# Patient Record
Sex: Female | Born: 1960
Health system: Southern US, Community
[De-identification: ages and names within clinical notes are randomized; demographics above are authoritative.]

## PROBLEM LIST (undated history)

## (undated) DIAGNOSIS — C801 Malignant (primary) neoplasm, unspecified: Secondary | ICD-10-CM

## (undated) HISTORY — PX: TONSILLECTOMY: SUR1361

---

## 1999-05-16 ENCOUNTER — Emergency Department (HOSPITAL_COMMUNITY): Admission: EM | Admit: 1999-05-16 | Discharge: 1999-05-16 | Payer: Self-pay | Admitting: Emergency Medicine

## 1999-11-25 ENCOUNTER — Emergency Department (HOSPITAL_COMMUNITY): Admission: EM | Admit: 1999-11-25 | Discharge: 1999-11-25 | Payer: Self-pay | Admitting: Internal Medicine

## 2000-12-01 ENCOUNTER — Emergency Department (HOSPITAL_COMMUNITY): Admission: EM | Admit: 2000-12-01 | Discharge: 2000-12-01 | Payer: Self-pay | Admitting: Emergency Medicine

## 2001-01-01 ENCOUNTER — Other Ambulatory Visit: Admission: RE | Admit: 2001-01-01 | Discharge: 2001-01-01 | Payer: Self-pay | Admitting: *Deleted

## 2001-03-24 ENCOUNTER — Emergency Department (HOSPITAL_COMMUNITY): Admission: EM | Admit: 2001-03-24 | Discharge: 2001-03-24 | Payer: Self-pay | Admitting: Emergency Medicine

## 2002-01-24 ENCOUNTER — Emergency Department (HOSPITAL_COMMUNITY): Admission: EM | Admit: 2002-01-24 | Discharge: 2002-01-24 | Payer: Self-pay | Admitting: Emergency Medicine

## 2003-04-04 ENCOUNTER — Emergency Department (HOSPITAL_COMMUNITY): Admission: EM | Admit: 2003-04-04 | Discharge: 2003-04-04 | Payer: Self-pay | Admitting: Emergency Medicine

## 2007-01-23 ENCOUNTER — Emergency Department (HOSPITAL_COMMUNITY): Admission: EM | Admit: 2007-01-23 | Discharge: 2007-01-23 | Payer: Self-pay | Admitting: Emergency Medicine

## 2007-07-12 ENCOUNTER — Emergency Department (HOSPITAL_COMMUNITY): Admission: EM | Admit: 2007-07-12 | Discharge: 2007-07-12 | Payer: Self-pay | Admitting: Emergency Medicine

## 2009-04-19 ENCOUNTER — Emergency Department (HOSPITAL_COMMUNITY): Admission: EM | Admit: 2009-04-19 | Discharge: 2009-04-19 | Payer: Self-pay | Admitting: Emergency Medicine

## 2010-01-10 ENCOUNTER — Ambulatory Visit: Payer: Self-pay | Admitting: Physician Assistant

## 2010-01-10 DIAGNOSIS — M545 Low back pain, unspecified: Secondary | ICD-10-CM | POA: Insufficient documentation

## 2010-01-10 DIAGNOSIS — R030 Elevated blood-pressure reading, without diagnosis of hypertension: Secondary | ICD-10-CM | POA: Insufficient documentation

## 2010-01-10 DIAGNOSIS — F17200 Nicotine dependence, unspecified, uncomplicated: Secondary | ICD-10-CM | POA: Insufficient documentation
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
## 2010-09-24 NOTE — Letter (Signed)
Summary: PT INFORMATION SHEET  PT INFORMATION SHEET   Imported By: Arta Bruce 03/01/2010 15:05:11  _____________________________________________________________________  External Attachment:    Type:   Image     Comment:   External Document

## 2010-09-24 NOTE — Letter (Signed)
Summary: Handout Printed  Printed Handout:  - Diet - Sodium-Controlled 

## 2010-11-06 ENCOUNTER — Emergency Department (HOSPITAL_COMMUNITY)
Admission: EM | Admit: 2010-11-06 | Discharge: 2010-11-06 | Disposition: A | Discharge status: Home / Self Care | Payer: Self-pay | Attending: Emergency Medicine | Admitting: Emergency Medicine

## 2010-11-06 DIAGNOSIS — B9789 Other viral agents as the cause of diseases classified elsewhere: Secondary | ICD-10-CM | POA: Insufficient documentation

## 2010-11-06 DIAGNOSIS — R05 Cough: Secondary | ICD-10-CM | POA: Insufficient documentation

## 2010-11-06 DIAGNOSIS — R059 Cough, unspecified: Secondary | ICD-10-CM | POA: Insufficient documentation

## 2010-11-30 LAB — CBC
HCT: 42.2 % (ref 36.0–46.0)
Hemoglobin: 13.9 g/dL (ref 12.0–15.0)
MCHC: 32.9 g/dL (ref 30.0–36.0)
MCV: 89.6 fL (ref 78.0–100.0)
Platelets: 184 10*3/uL (ref 150–400)
RBC: 4.71 MIL/uL (ref 3.87–5.11)
RDW: 13.2 % (ref 11.5–15.5)
WBC: 18.9 10*3/uL — ABNORMAL HIGH (ref 4.0–10.5)

## 2010-11-30 LAB — COMPREHENSIVE METABOLIC PANEL
ALT: 17 U/L (ref 0–35)
AST: 34 U/L (ref 0–37)
Albumin: 3.4 g/dL — ABNORMAL LOW (ref 3.5–5.2)
Alkaline Phosphatase: 68 U/L (ref 39–117)
BUN: 9 mg/dL (ref 6–23)
CO2: 25 mEq/L (ref 19–32)
Calcium: 9 mg/dL (ref 8.4–10.5)
Chloride: 97 mEq/L (ref 96–112)
Creatinine, Ser: 1.34 mg/dL — ABNORMAL HIGH (ref 0.4–1.2)
GFR calc Af Amer: 51 mL/min — ABNORMAL LOW (ref >60)
GFR calc non Af Amer: 42 mL/min — ABNORMAL LOW (ref >60)
Glucose, Bld: 114 mg/dL — ABNORMAL HIGH (ref 70–99)
Potassium: 3.6 mEq/L (ref 3.5–5.1)
Sodium: 135 mEq/L (ref 135–145)
Total Bilirubin: 1.5 mg/dL — ABNORMAL HIGH (ref 0.3–1.2)
Total Protein: 8.3 g/dL (ref 6.0–8.3)

## 2010-11-30 LAB — RAPID STREP SCREEN (MED CTR MEBANE ONLY): Streptococcus, Group A Screen (Direct): NEGATIVE

## 2010-11-30 LAB — URINE MICROSCOPIC-ADD ON

## 2010-11-30 LAB — DIFFERENTIAL
Basophils Absolute: 0 10*3/uL (ref 0.0–0.1)
Basophils Relative: 0 % (ref 0–1)
Eosinophils Absolute: 0 10*3/uL (ref 0.0–0.7)
Eosinophils Relative: 0 % (ref 0–5)
Lymphs Abs: 1.1 10*3/uL (ref 0.7–4.0)
Neutrophils Relative %: 86 % — ABNORMAL HIGH (ref 43–77)

## 2010-11-30 LAB — URINALYSIS, ROUTINE W REFLEX MICROSCOPIC
Bilirubin Urine: NEGATIVE
Ketones, ur: 40 mg/dL — AB
Nitrite: POSITIVE — AB
Protein, ur: 100 mg/dL — AB
Urobilinogen, UA: 1 mg/dL (ref 0.0–1.0)
pH: 6 (ref 5.0–8.0)

## 2010-11-30 LAB — MONONUCLEOSIS SCREEN: Mono Screen: NEGATIVE

## 2010-11-30 LAB — POCT PREGNANCY, URINE: Preg Test, Ur: NEGATIVE

## 2013-10-24 ENCOUNTER — Encounter (HOSPITAL_COMMUNITY): Payer: Self-pay | Admitting: Emergency Medicine

## 2013-10-24 ENCOUNTER — Emergency Department (HOSPITAL_COMMUNITY)
Admission: EM | Admit: 2013-10-24 | Discharge: 2013-10-24 | Disposition: A | Discharge status: Home / Self Care | Payer: No Typology Code available for payment source | Attending: Emergency Medicine | Admitting: Emergency Medicine

## 2013-10-24 DIAGNOSIS — R599 Enlarged lymph nodes, unspecified: Secondary | ICD-10-CM | POA: Insufficient documentation

## 2013-10-24 DIAGNOSIS — R000 Tachycardia, unspecified: Secondary | ICD-10-CM | POA: Insufficient documentation

## 2013-10-24 DIAGNOSIS — F17200 Nicotine dependence, unspecified, uncomplicated: Secondary | ICD-10-CM | POA: Insufficient documentation

## 2013-10-24 DIAGNOSIS — K047 Periapical abscess without sinus: Secondary | ICD-10-CM | POA: Insufficient documentation

## 2013-10-24 DIAGNOSIS — K029 Dental caries, unspecified: Secondary | ICD-10-CM | POA: Insufficient documentation

## 2013-10-24 MED ORDER — HYDROCODONE-ACETAMINOPHEN 5-325 MG PO TABS: 1.0000 | ORAL_TABLET | Freq: Four times a day (QID) | ORAL | Status: DC | PRN

## 2013-10-24 MED ORDER — HYDROCODONE-ACETAMINOPHEN 5-325 MG PO TABS
1.0000 | ORAL_TABLET | Freq: Once | ORAL | Status: AC
  Administered 2013-10-24: 1 via ORAL
  Filled 2013-10-24: qty 1

## 2013-10-24 MED ORDER — PENICILLIN V POTASSIUM 250 MG PO TABS: 250.0000 mg | ORAL_TABLET | Freq: Four times a day (QID) | ORAL | Status: AC

## 2013-10-24 NOTE — ED Notes (Signed)
Pt states that she has dental abscess for about two weeks but doesn't have dental insurance to get scene. Pt been having pain for the past two weeks then last night she started having facial swelling to left lower side of jaw/cheek and back of throat. Pt states "hard to swallow".

## 2013-10-24 NOTE — Discharge Instructions (Signed)
Abscessed Tooth  An abscessed tooth is an infection around your tooth. It may be caused by holes or damage to the tooth (cavity) or a dental disease. An abscessed tooth causes mild to very bad pain in and around the tooth. See your dentist right away if you have tooth or gum pain.  HOME CARE   Take your medicine as told. Finish it even if you start to feel better.   Do not drive after taking pain medicine.   Rinse your mouth (gargle) often with salt water ( teaspoon salt in 8 ounces of warm water).   Do not apply heat to the outside of your face.  GET HELP RIGHT AWAY IF:    You have a temperature by mouth above 102 F (38.9 C), not controlled by medicine.   You have chills and a very bad headache.   You have problems breathing or swallowing.   Your mouth will not open.   You develop puffiness (swelling) on the neck or around the eye.   Your pain is not helped by medicine.   Your pain is getting worse instead of better.  MAKE SURE YOU:    Understand these instructions.   Will watch your condition.   Will get help right away if you are not doing well or get worse.  Document Released: 01/28/2008 Document Revised: 11/03/2011 Document Reviewed: 11/19/2010  [employer] Patient Information 2014 [employer].

## 2013-10-24 NOTE — ED Provider Notes (Addendum)
CSN: [id number]     Arrival date & time 10/24/13  3220 History   First MD Initiated Contact with Patient 10/24/13 [phone]     Chief Complaint  Patient presents with  . Facial Swelling  . dental abscess      (Consider location/radiation/quality/duration/timing/severity/associated sxs/prior Treatment) Patient is a 53 y.o. female presenting with tooth pain. The history is provided by the patient.  Dental Pain Location:  Lower Lower teeth location:  18/LL 2nd molar Quality:  Pulsating, sharp and throbbing Severity:  Severe Onset quality:  Gradual Duration:  2 weeks Timing:  Intermittent Progression:  Worsening Chronicity:  New Context: abscess   Relieved by:  Nothing Worsened by:  Cold food/drink, hot food/drink, touching and jaw movement Ineffective treatments:  NSAIDs and ice Associated symptoms: facial pain, facial swelling and gum swelling   Associated symptoms: no congestion, no difficulty swallowing, no drooling, no fever, no neck pain, no neck swelling and no trismus   Risk factors: lack of dental care, periodontal disease and smoking   Risk factors: no immunosuppression     History reviewed. No pertinent past medical history. History reviewed. No pertinent past surgical history. No family history on file. History  Substance Use Topics  . Smoking status: Current Every Day Smoker    Types: Cigarettes  . Smokeless tobacco: Never Used  . Alcohol Use: Yes     Comment: social   OB History   Grav Para Term Preterm Abortions TAB SAB Ect Mult Living                 Review of Systems  Constitutional: Negative for fever.  HENT: Positive for facial swelling. Negative for congestion and drooling.   Musculoskeletal: Negative for neck pain.  All other systems reviewed and are negative.      Allergies  Review of patient's allergies indicates no known allergies.  Home Medications   Current Outpatient Rx  Name  Route  Sig  Dispense  Refill  . naproxen sodium (ANAPROX)  220 MG tablet   Oral   Take 220 mg by mouth 2 (two) times daily as needed.          BP 144/86  Pulse 105  Temp(Src) 99.6 F (37.6 C) (Oral)  Resp 17  SpO2 100% Physical Exam  Nursing note and vitals reviewed. Constitutional: She is oriented to person, place, and time.  HENT:  Head:    Mouth/Throat: No trismus in the jaw. Dental abscesses and dental caries present. No oropharyngeal exudate, posterior oropharyngeal edema, posterior oropharyngeal erythema or tonsillar abscesses.    Eyes: EOM are normal. Pupils are equal, round, and reactive to light.  Neck: Normal range of motion. Neck supple. No tracheal deviation present.  No palpable neck tenderness.  No swelling under the tongue  Cardiovascular: Tachycardia present.   Pulmonary/Chest: Effort normal. No stridor.  Lymphadenopathy:    She has cervical adenopathy.  Neurological: She is alert and oriented to person, place, and time.  Skin: Skin is warm and dry.  Psychiatric: She has a normal mood and affect. Her behavior is normal.    ED Course  Dental Date/Time: 10/24/2013 9:07 AM Performed by: Blanchie Dessert Authorized by: Blanchie Dessert Consent: Verbal consent obtained. Risks and benefits: risks, benefits and alternatives were discussed Consent given by: patient Local anesthesia used: yes Anesthesia: local infiltration Local anesthetic: bupivacaine 0.5% with epinephrine Anesthetic total: 2 ml Patient sedated: no Patient tolerance: Patient tolerated the procedure well with no immediate complications. Comments: Apical dental block preformed  for abscess drainage.  Only minimal pus expressed   (including critical care time) Labs Review Labs Reviewed - No data to display Imaging Review No results found.   EKG Interpretation None      MDM   Final diagnoses:  Dental abscess    Pt with dental caries and facial swelling.  No signs of ludwig's angina or difficulty swallowing and no systemic symptoms.  Pt  initially stated it was hard to swallow but no difficulty swallowing secretions and she states the swelling makes it hard to drink. Will treat with PCN and have pt f/u with dentist.     Blanchie Dessert, MD 10/24/13 [place], MD 10/24/13 [phone]

## 2013-11-03 ENCOUNTER — Encounter (HOSPITAL_COMMUNITY): Payer: Self-pay | Admitting: Emergency Medicine

## 2013-11-03 ENCOUNTER — Emergency Department (HOSPITAL_COMMUNITY)
Admission: EM | Admit: 2013-11-03 | Discharge: 2013-11-03 | Disposition: A | Discharge status: Home / Self Care | Payer: No Typology Code available for payment source | Attending: Emergency Medicine | Admitting: Emergency Medicine

## 2013-11-03 DIAGNOSIS — F17200 Nicotine dependence, unspecified, uncomplicated: Secondary | ICD-10-CM | POA: Insufficient documentation

## 2013-11-03 DIAGNOSIS — K029 Dental caries, unspecified: Secondary | ICD-10-CM | POA: Insufficient documentation

## 2013-11-03 DIAGNOSIS — K002 Abnormalities of size and form of teeth: Secondary | ICD-10-CM | POA: Insufficient documentation

## 2013-11-03 DIAGNOSIS — K047 Periapical abscess without sinus: Secondary | ICD-10-CM | POA: Insufficient documentation

## 2013-11-03 MED ORDER — CLINDAMYCIN HCL 300 MG PO CAPS
300.0000 mg | ORAL_CAPSULE | Freq: Once | ORAL | Status: AC
  Administered 2013-11-03: 300 mg via ORAL
  Filled 2013-11-03: qty 1

## 2013-11-03 MED ORDER — OXYCODONE-ACETAMINOPHEN 5-325 MG PO TABS
1.0000 | ORAL_TABLET | ORAL | Status: DC | PRN
Start: 2013-11-03 — End: 2013-12-09

## 2013-11-03 MED ORDER — OXYCODONE-ACETAMINOPHEN 5-325 MG PO TABS
1.0000 | ORAL_TABLET | Freq: Once | ORAL | Status: AC
  Administered 2013-11-03: 1 via ORAL
  Filled 2013-11-03: qty 1

## 2013-11-03 MED ORDER — CLINDAMYCIN HCL 150 MG PO CAPS: 300.0000 mg | ORAL_CAPSULE | Freq: Three times a day (TID) | ORAL | Status: DC

## 2013-11-03 NOTE — ED Notes (Signed)
Pt c/o lower, L side dental abscess and L side facial swelling.  Pain score 9/10.  Pt reports she was recently seen at [facility] for same.  Sts she had the abscess drained and completed prescribed antibiotic.  Sts she is waiting for new dental coverage to start, before she can see a dentist.  Swelling noted.

## 2013-11-03 NOTE — ED Provider Notes (Signed)
CSN: [id number]     Arrival date & time 11/03/13  1213 History  This chart was scribed for non-physician practitioner working with Blanchard Kelch, MD by Stacy Gardner, ED scribe. This patient was seen in room WTR6/WTR6 and the patient's care was started at 1:10 PM.  First MD Initiated Contact with Patient 11/03/13 1226     Chief Complaint  Patient presents with  . Dental Pain     (Consider location/radiation/quality/duration/timing/severity/associated sxs/prior Treatment) Patient is a 53 y.o. female presenting with tooth pain. The history is provided by the patient and medical records. No language interpreter was used.  Dental Pain  HPI Comments: Linda Spears is a 53 y.o. female who presents to the Emergency Department complaining of lower left side dental pain from an abscess, onset of three weeks ago. Pt was seen at the ED one week ago for the abscess and had it drained. She received 40 Penicillin tablets and 15 hydrocodones which she finished the prescriptions for both. After taking the medications she mentions the swelling that extended throughout her face decreased significantly before it returned yesterday. Pt mentions that she has been taking Ibuprofen and Aleve to relieve the pain. Yesterday she reports taking Advil while at work when the left sided facial swelling presented. The swelling extends from her lower jaw to her throat. She reports having associated numbness. She states she is unable to yawn and the site feels tight. Denies trouble breathing. The infected tooth has a pus discharge.  Pt has tried cold compresses and denies applying any warm compressions.  Pt recently recieved dental insurance and she received a list of dentists yesterday   History reviewed. No pertinent past medical history. History reviewed. No pertinent past surgical history. History reviewed. No pertinent family history. History  Substance Use Topics  . Smoking status: Current Every Day Smoker     Types: Cigarettes  . Smokeless tobacco: Never Used  . Alcohol Use: Yes     Comment: social   OB History   Grav Para Term Preterm Abortions TAB SAB Ect Mult Living                 Review of Systems  HENT: Positive for dental problem.   All other systems reviewed and are negative.      Allergies  Review of patient's allergies indicates no known allergies.  Home Medications   Current Outpatient Rx  Name  Route  Sig  Dispense  Refill  . ibuprofen (ADVIL,MOTRIN) 200 MG tablet   Oral   Take 200-400 mg by mouth every 6 (six) hours as needed for moderate pain.         . naproxen sodium (ANAPROX) 220 MG tablet   Oral   Take 220 mg by mouth 2 (two) times daily as needed (pain).           BP 142/84  Pulse 56  Resp 16  SpO2 100% Physical Exam  Nursing note and vitals reviewed. Constitutional: She is oriented to person, place, and time. She appears well-developed and well-nourished. No distress.  HENT:  Head: Normocephalic and atraumatic.  Mouth/Throat: Oropharynx is clear and moist. Abnormal dentition. Dental abscesses and dental caries present.  Left Facial swelling noted. mandibular and submandibular tenderness to palpation Poor detention with multiple dental carries Gum edema surrounding 1st/2nd left lower bicuspids purulent drainage noted from the gums No swelling under the tongue. No trismus   Eyes: EOM are normal. Pupils are equal, round, and reactive to light.  Neck: Normal range of motion. Neck supple. No tracheal deviation present.  Cardiovascular: Normal rate.   Pulmonary/Chest: Effort normal. No respiratory distress.  No difficulty breathing  Abdominal: Soft. She exhibits no distension.  Musculoskeletal: Normal range of motion.  Neurological: She is alert and oriented to person, place, and time.  Skin: Skin is warm and dry.  Psychiatric: She has a normal mood and affect. Her behavior is normal.    ED Course  Procedures (including critical care  time) DIAGNOSTIC STUDIES: Oxygen Saturation is 100% on room air, normal by my interpretation.    COORDINATION OF CARE:  1:16 PM Discussed course of care with pt which includes Cleocin 300 mg and Percocet/Roxicet 5-325 mg. Pt understands and agrees.   Labs Review Labs Reviewed - No data to display Imaging Review No results found.   EKG Interpretation None      MDM   Final diagnoses:  Dental abscess   Patient with left-sided dental abscess. She was here a few days ago and had the same abscess drained a large purulent drainage. She states today is not as bad as it was last time. It is spontaneously draining.  I offered her draining abscess again however she declined. She states she just needs antibiotics until she is able to get in but her dentist. She does have an appointment scheduled for next week. She's afebrile emergency department, nontoxic appearing, no airway compromise at this time. I will start her on clindamycin, pain medications provided. Have instructed her to return if swelling is worsening or if she has any trouble breathing. Patient voiced understanding.  Filed Vitals:   11/03/13 1224  BP: 142/84  Pulse: 56  TempSrc: Oral  Resp: 16  SpO2: 100%    I personally performed the services described in this documentation, which was scribed in my presence. The recorded information has been reviewed and is accurate.    Renold Genta, PA-C 11/03/13 2033

## 2013-11-03 NOTE — ED Provider Notes (Signed)
Medical screening examination/treatment/procedure(s) were performed by non-physician practitioner and as supervising physician I was immediately available for consultation/collaboration.   EKG Interpretation None      Rolland Porter, MD, Abram Sander   Janice Norrie, MD 11/03/13 [phone]

## 2013-11-03 NOTE — Discharge Instructions (Signed)
Clindamycin for infection until all gone. Percocet for severe pain if no relief with ibuprofen. Follow up with an oral surgeon as soon as able. Return if swelling is worsening.    Dental Abscess A dental abscess is a collection of infected fluid (pus) from a bacterial infection in the inner part of the tooth (pulp). It usually occurs at the end of the tooth's root.  CAUSES   Severe tooth decay.  Trauma to the tooth that allows bacteria to enter into the pulp, such as a broken or chipped tooth. SYMPTOMS   Severe pain in and around the infected tooth.  Swelling and redness around the abscessed tooth or in the mouth or face.  Tenderness.  Pus drainage.  Bad breath.  Bitter taste in the mouth.  Difficulty swallowing.  Difficulty opening the mouth.  Nausea.  Vomiting.  Chills.  Swollen neck glands. DIAGNOSIS   A medical and dental history will be taken.  An examination will be performed by tapping on the abscessed tooth.  X-rays may be taken of the tooth to identify the abscess. TREATMENT The goal of treatment is to eliminate the infection. You may be prescribed antibiotic medicine to stop the infection from spreading. A root canal may be performed to save the tooth. If the tooth cannot be saved, it may be pulled (extracted) and the abscess may be drained.  HOME CARE INSTRUCTIONS  Only take over-the-counter or prescription medicines for pain, fever, or discomfort as directed by your caregiver.  Rinse your mouth (gargle) often with salt water ( tsp salt in 8 oz [250 ml] of warm water) to relieve pain or swelling.  Do not drive after taking pain medicine (narcotics).  Do not apply heat to the outside of your face.  Return to your dentist for further treatment as directed. SEEK MEDICAL CARE IF:  Your pain is not helped by medicine.  Your pain is getting worse instead of better. SEEK IMMEDIATE MEDICAL CARE IF:  You have a fever or persistent symptoms for more  than 2 3 days.  You have a fever and your symptoms suddenly get worse.  You have chills or a very bad headache.  You have problems breathing or swallowing.  You have trouble opening your mouth.  You have swelling in the neck or around the eye. Document Released: 08/11/2005 Document Revised: 05/05/2012 Document Reviewed: 11/19/2010 [facility] Patient Information 2014 [place], [place].

## 2013-12-09 ENCOUNTER — Encounter (HOSPITAL_COMMUNITY): Payer: Self-pay | Admitting: Emergency Medicine

## 2013-12-09 ENCOUNTER — Emergency Department (HOSPITAL_COMMUNITY)
Admission: EM | Admit: 2013-12-09 | Discharge: 2013-12-09 | Disposition: A | Discharge status: Home / Self Care | Payer: No Typology Code available for payment source | Attending: Emergency Medicine | Admitting: Emergency Medicine

## 2013-12-09 DIAGNOSIS — R21 Rash and other nonspecific skin eruption: Secondary | ICD-10-CM | POA: Insufficient documentation

## 2013-12-09 DIAGNOSIS — F17200 Nicotine dependence, unspecified, uncomplicated: Secondary | ICD-10-CM | POA: Insufficient documentation

## 2013-12-09 MED ORDER — SULFAMETHOXAZOLE-TRIMETHOPRIM 800-160 MG PO TABS: 1.0000 | ORAL_TABLET | Freq: Two times a day (BID) | ORAL | Status: DC

## 2013-12-09 MED ORDER — PERMETHRIN 5 % EX CREA: TOPICAL_CREAM | CUTANEOUS | Status: DC

## 2013-12-09 MED ORDER — HYDROXYZINE HCL 25 MG PO TABS
25.0000 mg | ORAL_TABLET | Freq: Four times a day (QID) | ORAL | Status: DC
Start: 2013-12-09 — End: 2014-01-26

## 2013-12-09 NOTE — ED Notes (Signed)
Pt c/o skin itching x 1 week, states she itches until sores form. Rash starts as small brown/red dots on ankles and wrists, fill will pus, then burst and form small dry sores.

## 2013-12-09 NOTE — Discharge Instructions (Signed)
Take Bactrim as directed until gone. Take atarax as needed for itching. Use Permethrin cream if the other interventions provide no relief. Refer to attached documents for more information.

## 2013-12-09 NOTE — ED Provider Notes (Signed)
CSN: [id number]     Arrival date & time 12/09/13  1112 History  This chart was scribed for non-physician practitioner, Alvina Chou, PA-C, working with Orlie Dakin, MD by Ladene Artist, ED Scribe. This patient was seen in room WTR7/WTR7 and the patient's care was started at 12:21 PM.   Chief Complaint  Patient presents with  . Rash   Patient is a 53 y.o. female presenting with rash. The history is provided by the patient. No language interpreter was used.  Rash Location:  Foot and hand Hand rash location:  L hand Foot rash location:  L ankle and R ankle Quality: burning and itchiness   Severity:  Moderate Onset quality:  Gradual Duration:  1 week Timing:  Constant Progression:  Spreading Chronicity:  New Relieved by:  Nothing Worsened by:  Nothing tried Ineffective treatments:  Anti-itch cream  HPI Comments: Linda Spears is a 53 y.o. female who presents to the Emergency Department complaining of rash on her L hand and both ankles onset 1 week ago. She reports associated itching and burning sensations in areas of the rash. Pt states that the rash looked like bug bites at first, but she later noticed pus before they burst. She has tried cortisone cream with no relief.   History reviewed. No pertinent past medical history. History reviewed. No pertinent past surgical history. History reviewed. No pertinent family history. History  Substance Use Topics  . Smoking status: Current Every Day Smoker    Types: Cigarettes  . Smokeless tobacco: Never Used  . Alcohol Use: Yes     Comment: social   OB History   Grav Para Term Preterm Abortions TAB SAB Ect Mult Living                 Review of Systems  Skin: Positive for rash.  All other systems reviewed and are negative.   Allergies  Review of patient's allergies indicates no known allergies.  Home Medications   Prior to Admission medications   Medication Sig Start Date End Date Taking? Authorizing Provider   ibuprofen (ADVIL,MOTRIN) 200 MG tablet Take 200-400 mg by mouth every 6 (six) hours as needed for moderate pain.   Yes Historical Provider, MD  Multiple Vitamins-Minerals (MULTIVITAMIN WITH MINERALS) tablet Take 1 tablet by mouth as needed.   Yes Historical Provider, MD   Triage Vitals: BP 145/79  Pulse 66  Temp(Src) 98.7 F (37.1 C) (Oral)  Resp 14  SpO2 100% Physical Exam  Nursing note and vitals reviewed. Constitutional: She is oriented to person, place, and time. She appears well-developed and well-nourished. No distress.  HENT:  Head: Normocephalic and atraumatic.  Eyes: EOM are normal.  Neck: Neck supple.  Cardiovascular: Normal rate.   Pulmonary/Chest: Effort normal. No respiratory distress.  Musculoskeletal: Normal range of motion.  Neurological: She is alert and oriented to person, place, and time.  Skin: Skin is warm and dry.  Papule lesions on L wrist and bilateral ankles with overlying excoriations 2 urticarial lesions on dorsal forearm that are erythematous and warm   Psychiatric: She has a normal mood and affect. Her behavior is normal.    ED Course  Procedures (including critical care time) DIAGNOSTIC STUDIES: Oxygen Saturation is100% on RA, normal by my interpretation.    COORDINATION OF CARE: 12:26 PM-Discussed treatment plan which includes anti-itch cream with pt at bedside and pt agreed to plan.   DIAGNOSTIC STUDIES: Oxygen Saturation is 100% on RA, normal by my interpretation.    COORDINATION  OF CARE:  Labs Review Labs Reviewed - No data to display  Imaging Review No results found.   EKG Interpretation None      MDM   Final diagnoses:  Rash    Patient will have bactrim and atarax for her rash. Patient advised to use Permethrin cream is symptoms do not improve in a few days. Vitals stable and patient afebrile.   I personally performed the services described in this documentation, which was scribed in my presence. The recorded information  has been reviewed and is accurate.    Alvina Chou, [place] 12/09/13 1831

## 2013-12-09 NOTE — Progress Notes (Signed)
  CARE MANAGEMENT ED NOTE 12/09/2013  Patient:  Linda Spears, Linda Spears   Account Number:  [id number]  Date Initiated:  12/09/2013  Documentation initiated by:  Jackelyn Poling  Subjective/Objective Assessment:   53 yr old 66 covered [place] pt c/o skin itching x 1 week, states she itches until sores form. Rash starts as small brown/red dots on ankles and wrists, fill will pus, then burst and form small dry sores.     Subjective/Objective Assessment Detail:   Pt voiced appreciation of resources provided     Action/Plan:   CM spoke with pt and provided her with [facility] list [facility] with in zip code [phone]   Action/Plan Detail:   Anticipated DC Date:  12/09/2013     Status Recommendation to Physician:   Result of Recommendation:    Other ED Services  Consult [facility]  Other  Outpatient Services - Pt will follow up  PCP issues    Choice offered to / List presented to:            Status of service:  Completed, signed off  ED Comments:   ED Comments Detail:

## 2013-12-10 NOTE — ED Provider Notes (Signed)
Medical screening examination/treatment/procedure(s) were performed by non-physician practitioner and as supervising physician I was immediately available for consultation/collaboration.   EKG Interpretation None       Orlie Dakin, MD 12/10/13 [phone]

## 2014-01-26 ENCOUNTER — Emergency Department (HOSPITAL_COMMUNITY)
Admission: EM | Admit: 2014-01-26 | Discharge: 2014-01-26 | Disposition: A | Discharge status: Home / Self Care | Payer: No Typology Code available for payment source | Attending: Emergency Medicine | Admitting: Emergency Medicine

## 2014-01-26 ENCOUNTER — Encounter (HOSPITAL_COMMUNITY): Payer: Self-pay | Admitting: Emergency Medicine

## 2014-01-26 DIAGNOSIS — L0291 Cutaneous abscess, unspecified: Secondary | ICD-10-CM

## 2014-01-26 DIAGNOSIS — L03211 Cellulitis of face: Principal | ICD-10-CM

## 2014-01-26 DIAGNOSIS — Z79899 Other long term (current) drug therapy: Secondary | ICD-10-CM | POA: Insufficient documentation

## 2014-01-26 DIAGNOSIS — L0201 Cutaneous abscess of face: Secondary | ICD-10-CM | POA: Insufficient documentation

## 2014-01-26 DIAGNOSIS — K089 Disorder of teeth and supporting structures, unspecified: Secondary | ICD-10-CM | POA: Insufficient documentation

## 2014-01-26 DIAGNOSIS — F17200 Nicotine dependence, unspecified, uncomplicated: Secondary | ICD-10-CM | POA: Insufficient documentation

## 2014-01-26 MED ORDER — CLINDAMYCIN HCL 300 MG PO CAPS
300.0000 mg | ORAL_CAPSULE | Freq: Once | ORAL | Status: AC
  Administered 2014-01-26: 300 mg via ORAL
  Filled 2014-01-26: qty 1

## 2014-01-26 MED ORDER — TRAMADOL HCL 50 MG PO TABS: 50.0000 mg | ORAL_TABLET | Freq: Four times a day (QID) | ORAL | Status: DC | PRN

## 2014-01-26 MED ORDER — CLINDAMYCIN HCL 300 MG PO CAPS: 300.0000 mg | ORAL_CAPSULE | Freq: Four times a day (QID) | ORAL | Status: DC

## 2014-01-26 NOTE — Discharge Instructions (Signed)

## 2014-01-26 NOTE — ED Provider Notes (Signed)
Medical screening examination/treatment/procedure(s) were conducted as a shared visit with non-physician practitioner(s) and myself.  I personally evaluated the patient during the encounter.   EKG Interpretation None      Patient here with L submandibular abscess. No tooth pain. No fever, no airway impingement. No elevation of floor of mouth. I&D performed with good results. Sent home on Clindamycin. Stable for discharge.  Osvaldo Shipper, MD 01/26/14 [phone]

## 2014-01-26 NOTE — ED Notes (Signed)
Pt has abscess to left chin.  Pt does shave around this area.  Spot came up tuesday

## 2014-01-26 NOTE — ED Provider Notes (Signed)
CSN: [id number]     Arrival date & time 01/26/14  0957 History   First MD Initiated Contact with Patient 01/26/14 1017     Chief Complaint  Patient presents with  . Abscess     (Consider location/radiation/quality/duration/timing/severity/associated sxs/prior Treatment) HPI Comments: Patient is a 53 year old female who presents today with a worsening abscess to left chin. She states that she was shaving her chin on Sunday and wonders if this could have caused the abscess develop. She does not have history of prior abscesses. She also notes that she had a dental pain approximately one month ago. The pain was in the same area as the abscess. She made a dentist appointment, but cannot be seen until later this month. She has felt chills, but denies any fevers. No shortness of breath, difficulty opening her mouth, difficulty swallowing, nausea, vomiting, abdominal pain.  Patient is a 53 y.o. female presenting with abscess. The history is provided by the patient. No language interpreter was used.  Abscess Associated symptoms: no fever, no nausea and no vomiting     History reviewed. No pertinent past medical history. History reviewed. No pertinent past surgical history. History reviewed. No pertinent family history. History  Substance Use Topics  . Smoking status: Current Every Day Smoker    Types: Cigarettes  . Smokeless tobacco: Never Used  . Alcohol Use: Yes     Comment: social   OB History   Grav Para Term Preterm Abortions TAB SAB Ect Mult Living                 Review of Systems  Constitutional: Positive for chills. Negative for fever.  HENT: Positive for dental problem and facial swelling. Negative for trouble swallowing.   Respiratory: Negative for shortness of breath.   Gastrointestinal: Negative for nausea and vomiting.  All other systems reviewed and are negative.     Allergies  Review of patient's allergies indicates no known allergies.  Home Medications   Prior  to Admission medications   Medication Sig Start Date End Date Taking? Authorizing Provider  Multiple Vitamins-Minerals (MULTIVITAMIN WITH MINERALS) tablet Take 1 tablet by mouth as needed.   Yes Historical Provider, MD  naproxen sodium (ANAPROX) 220 MG tablet Take 440 mg by mouth 2 (two) times daily with a meal.   Yes Historical Provider, MD   BP 123/77  Pulse 65  Temp(Src) 99.9 F (37.7 C) (Oral)  Resp 18  SpO2 100% Physical Exam  Nursing note and vitals reviewed. Constitutional: She is oriented to person, place, and time. She appears well-developed and well-nourished. No distress.  HENT:  Head: Normocephalic and atraumatic.  Right Ear: External ear normal.  Left Ear: External ear normal.  Nose: Nose normal.  Mouth/Throat: Uvula is midline and oropharynx is clear and moist. No trismus in the jaw.  No trismus, submental edema, or tongue elevation. Currently no tenderness to palpation in left gum or teeth. Patient with 5 cm area of induration to left chin. There is a central 2 cm area of fluctuance. No drainage.   Eyes: Conjunctivae are normal.  Neck: Normal range of motion.  Cardiovascular: Normal rate, regular rhythm and normal heart sounds.   Pulmonary/Chest: Effort normal and breath sounds normal. No stridor. No respiratory distress. She has no wheezes. She has no rales.  Abdominal: Soft. She exhibits no distension.  Musculoskeletal: Normal range of motion.  Neurological: She is alert and oriented to person, place, and time. She has normal strength.  Skin: Skin is warm  and dry. She is not diaphoretic. No erythema.  Psychiatric: She has a normal mood and affect. Her behavior is normal.    ED Course  Procedures (including critical care time) Labs Review Labs Reviewed - No data to display  Imaging Review No results found.   EKG Interpretation None      INCISION AND DRAINAGE Performed by: Elwyn Lade Consent: Verbal consent obtained. Risks and benefits: risks,  benefits and alternatives were discussed Type: abscess  Body area: left chin Anesthesia: local infiltration  Incision was made with a scalpel.  Local anesthetic: lidocaine 2%   Anesthetic total: 3 ml  Complexity: complex Blunt dissection to break up loculations  Drainage: purulent  Drainage amount: copious  Patient tolerance: Patient tolerated the procedure well with no immediate complications.     MDM   Final diagnoses:  Abscess    Patient with skin abscess amenable to incision and drainage.  Abscess was not large enough to warrant packing or drain,  wound recheck in 2 days. Patient without trismus, difficulty swallowing, or difficulty breathing. Encouraged home warm soaks and flushing.  Mild signs of cellulitis is surrounding skin.  Will d/c to home with clindamycin. Strict return instructions given. Patient has dental follow up. Dr. Mingo Amber evaluated patient and agrees with plan. Patient / Family / Caregiver informed of clinical course, understand medical decision-making process, and agree with plan.     Elwyn Lade, PA-C 01/26/14 1153

## 2014-01-26 NOTE — Progress Notes (Signed)
[facility] did not get to see patient but will be sending information about [facility] Orange Card program, using the address provided.  °

## 2014-01-27 MED ORDER — SULFAMETHOXAZOLE-TRIMETHOPRIM 800-160 MG PO TABS: 1.0000 | ORAL_TABLET | Freq: Two times a day (BID) | ORAL | Status: DC

## 2014-01-27 MED ORDER — CEPHALEXIN 500 MG PO CAPS: 500.0000 mg | ORAL_CAPSULE | Freq: Four times a day (QID) | ORAL | Status: DC

## 2014-01-27 NOTE — ED Provider Notes (Signed)
Medical screening examination/treatment/procedure(s) were performed by non-physician practitioner and as supervising physician I was immediately available for consultation/collaboration.   EKG Interpretation None        Ephraim Hamburger, MD 01/27/14 [phone]

## 2014-01-27 NOTE — ED Provider Notes (Signed)
Patient returned to the ED because she was unable to afford her Clindamycin.  I reviewed her chart from yesterday and it was reported that her cellulitis was mild. I will change to Keflex and Bactrim. NKDA,  Linus Mako, PA-C 01/27/14 1049

## 2014-04-02 ENCOUNTER — Emergency Department (HOSPITAL_COMMUNITY)
Admission: EM | Admit: 2014-04-02 | Discharge: 2014-04-02 | Disposition: A | Discharge status: Home / Self Care | Payer: No Typology Code available for payment source | Attending: Emergency Medicine | Admitting: Emergency Medicine

## 2014-04-02 ENCOUNTER — Encounter (HOSPITAL_COMMUNITY): Payer: Self-pay | Admitting: Emergency Medicine

## 2014-04-02 ENCOUNTER — Emergency Department (HOSPITAL_COMMUNITY): Payer: No Typology Code available for payment source

## 2014-04-02 DIAGNOSIS — R51 Headache: Secondary | ICD-10-CM | POA: Insufficient documentation

## 2014-04-02 DIAGNOSIS — Z791 Long term (current) use of non-steroidal anti-inflammatories (NSAID): Secondary | ICD-10-CM | POA: Insufficient documentation

## 2014-04-02 DIAGNOSIS — F1310 Sedative, hypnotic or anxiolytic abuse, uncomplicated: Secondary | ICD-10-CM | POA: Insufficient documentation

## 2014-04-02 DIAGNOSIS — R42 Dizziness and giddiness: Secondary | ICD-10-CM | POA: Insufficient documentation

## 2014-04-02 DIAGNOSIS — F17200 Nicotine dependence, unspecified, uncomplicated: Secondary | ICD-10-CM | POA: Insufficient documentation

## 2014-04-02 DIAGNOSIS — Z79899 Other long term (current) drug therapy: Secondary | ICD-10-CM | POA: Insufficient documentation

## 2014-04-02 DIAGNOSIS — R9431 Abnormal electrocardiogram [ECG] [EKG]: Secondary | ICD-10-CM | POA: Insufficient documentation

## 2014-04-02 DIAGNOSIS — R079 Chest pain, unspecified: Secondary | ICD-10-CM | POA: Insufficient documentation

## 2014-04-02 LAB — CBC WITH DIFFERENTIAL/PLATELET
BASOS ABS: 0 10*3/uL (ref 0.0–0.1)
Basophils Relative: 1 % (ref 0–1)
EOS PCT: 5 % (ref 0–5)
Eosinophils Absolute: 0.3 10*3/uL (ref 0.0–0.7)
HEMATOCRIT: 42.4 % (ref 36.0–46.0)
HEMOGLOBIN: 13.5 g/dL (ref 12.0–15.0)
LYMPHS ABS: 2.7 10*3/uL (ref 0.7–4.0)
LYMPHS PCT: 43 % (ref 12–46)
MCH: 27.7 pg (ref 26.0–34.0)
MCHC: 31.8 g/dL (ref 30.0–36.0)
MCV: 87.1 fL (ref 78.0–100.0)
MONO ABS: 0.5 10*3/uL (ref 0.1–1.0)
MONOS PCT: 8 % (ref 3–12)
Neutro Abs: 2.7 10*3/uL (ref 1.7–7.7)
Neutrophils Relative %: 43 % (ref 43–77)
Platelets: 217 10*3/uL (ref 150–400)
RBC: 4.87 MIL/uL (ref 3.87–5.11)
RDW: 14.6 % (ref 11.5–15.5)
WBC: 6.1 10*3/uL (ref 4.0–10.5)

## 2014-04-02 LAB — BASIC METABOLIC PANEL
Anion gap: 12 (ref 5–15)
BUN: 13 mg/dL (ref 6–23)
CALCIUM: 9.8 mg/dL (ref 8.4–10.5)
CO2: 26 mEq/L (ref 19–32)
Chloride: 96 mEq/L (ref 96–112)
Creatinine, Ser: 0.98 mg/dL (ref 0.50–1.10)
GFR calc Af Amer: 74 mL/min — ABNORMAL LOW (ref >90)
GFR, EST NON AFRICAN AMERICAN: 64 mL/min — AB (ref >90)
GLUCOSE: 91 mg/dL (ref 70–99)
POTASSIUM: 4.5 meq/L (ref 3.7–5.3)
Sodium: 134 mEq/L — ABNORMAL LOW (ref 137–147)

## 2014-04-02 LAB — TROPONIN I: Troponin I: <0.3 ng/mL (ref <0.30)

## 2014-04-02 LAB — MAGNESIUM: Magnesium: 2.4 mg/dL (ref 1.5–2.5)

## 2014-04-02 MED ORDER — ASPIRIN 81 MG PO CHEW
324.0000 mg | CHEWABLE_TABLET | Freq: Once | ORAL | Status: AC
  Administered 2014-04-02: 324 mg via ORAL
  Filled 2014-04-02: qty 4

## 2014-04-02 NOTE — Discharge Instructions (Signed)
Called cardiology to help arrange stress test. Return to the ER for chest pain and returns or any new concerns. If you were given medicines take as directed.  If you are on coumadin or contraceptives realize their levels and effectiveness is altered by many different medicines.  If you have any reaction (rash, tongues swelling, other) to the medicines stop taking and see a physician.   Please follow up as directed and return to the ER or see a physician for new or worsening symptoms.  Thank you. Filed Vitals:   04/02/14 1300  BP: 154/91  Pulse: 88  Temp: 98.1 F (36.7 C)  TempSrc: Oral  Resp: 17  SpO2: 100%    Chest Pain (Nonspecific) It is often hard to give a specific diagnosis for the cause of chest pain. There is always a chance that your pain could be related to something serious, such as a heart attack or a blood clot in the lungs. You need to follow up with your health care provider for further evaluation. CAUSES   Heartburn.  Pneumonia or bronchitis.  Anxiety or stress.  Inflammation around your heart (pericarditis) or lung (pleuritis or pleurisy).  A blood clot in the lung.  A collapsed lung (pneumothorax). It can develop suddenly on its own (spontaneous pneumothorax) or from trauma to the chest.  Shingles infection (herpes zoster virus). The chest wall is composed of bones, muscles, and cartilage. Any of these can be the source of the pain.  The bones can be bruised by injury.  The muscles or cartilage can be strained by coughing or overwork.  The cartilage can be affected by inflammation and become sore (costochondritis). DIAGNOSIS  Lab tests or other studies may be needed to find the cause of your pain. Your health care provider may have you take a test called an ambulatory electrocardiogram (ECG). An ECG records your heartbeat patterns over a 24-hour period. You may also have other tests, such as:  Transthoracic echocardiogram (TTE). During echocardiography,  sound waves are used to evaluate how blood flows through your heart.  Transesophageal echocardiogram (TEE).  Cardiac monitoring. This allows your health care provider to monitor your heart rate and rhythm in real time.  Holter monitor. This is a portable device that records your heartbeat and can help diagnose heart arrhythmias. It allows your health care provider to track your heart activity for several days, if needed.  Stress tests by exercise or by giving medicine that makes the heart beat faster. TREATMENT   Treatment depends on what may be causing your chest pain. Treatment may include:  Acid blockers for heartburn.  Anti-inflammatory medicine.  Pain medicine for inflammatory conditions.  Antibiotics if an infection is present.  You may be advised to change lifestyle habits. This includes stopping smoking and avoiding alcohol, caffeine, and chocolate.  You may be advised to keep your head raised (elevated) when sleeping. This reduces the chance of acid going backward from your stomach into your esophagus. Most of the time, nonspecific chest pain will improve within 2-3 days with rest and mild pain medicine.  HOME CARE INSTRUCTIONS   If antibiotics were prescribed, take them as directed. Finish them even if you start to feel better.  For the next few days, avoid physical activities that bring on chest pain. Continue physical activities as directed.  Do not use any tobacco products, including cigarettes, chewing tobacco, or electronic cigarettes.  Avoid drinking alcohol.  Only take medicine as directed by your health care provider.  Follow  your health care provider's suggestions for further testing if your chest pain does not go away.  Keep any follow-up appointments you made. If you do not go to an appointment, you could develop lasting (chronic) problems with pain. If there is any problem keeping an appointment, call to reschedule. SEEK MEDICAL CARE IF:   Your chest  pain does not go away, even after treatment.  You have a rash with blisters on your chest.  You have a fever. SEEK IMMEDIATE MEDICAL CARE IF:   You have increased chest pain or pain that spreads to your arm, neck, jaw, back, or abdomen.  You have shortness of breath.  You have an increasing cough, or you cough up blood.  You have severe back or abdominal pain.  You feel nauseous or vomit.  You have severe weakness.  You faint.  You have chills. This is an emergency. Do not wait to see if the pain will go away. Get medical help at once. Call your local emergency services (911 in [place] Do not drive yourself to the hospital. MAKE SURE YOU:   Understand these instructions.  Will watch your condition.  Will get help right away if you are not doing well or get worse. Document Released: 05/21/2005 Document Revised: 08/16/2013 Document Reviewed: 03/16/2008 [facility] Patient Information 2015 [place], [place]. This information is not intended to replace advice given to you by your health care provider. Make sure you discuss any questions you have with your health care provider.

## 2014-04-02 NOTE — ED Notes (Signed)
Pt reports chest pain earlier today, denies any pain at this time

## 2014-04-02 NOTE — ED Notes (Addendum)
During hourly rounding pt sts she feels okay, and wants to know how much longer before her blood work is back , because she "has to go to [employer] to finish homework", and to cath a bus. Pt sts if it will take too long she will rather leave than wait. Dr Reather Converse notified.

## 2014-04-02 NOTE — ED Provider Notes (Signed)
CSN: [id number]     Arrival date & time 04/02/14  1253 History   First MD Initiated Contact with Patient 04/02/14 1318     Chief Complaint  Patient presents with  . Chest Pain     (Consider location/radiation/quality/duration/timing/severity/associated sxs/prior Treatment) HPI Comments: 53 year old female with smoking history occasional alcohol use presents with left chest tightness since 5:30 this morning. Fairly constant until she took Aleve prior to arrival. No history of heart problems or family history of cardiac.  Patient denies classic blood clot risk factors. Patient currently has no symptoms. Patient had mild anterior headache around her eyes that resolved with Aleve. Patient had mild lightheadedness the results. Gradual onset headache started 2 days prior is similar previous. Patient denies neck pain with headache and no direct correlation chest pain or headache. Patient denies other cardiac risk factors except for smoking history. Patient had brief episode of chest pain in months past that resolved on its own and patient did not seek medical attention. No exertional symptoms.  Patient is a 53 y.o. female presenting with chest pain. The history is provided by the patient.  Chest Pain Associated symptoms: headache   Associated symptoms: no abdominal pain, no back pain, no fever, no shortness of breath and not vomiting     History reviewed. No pertinent past medical history. No past surgical history on file. No family history on file. History  Substance Use Topics  . Smoking status: Current Every Day Smoker    Types: Cigarettes  . Smokeless tobacco: Never Used  . Alcohol Use: Yes     Comment: social   OB History   Grav Para Term Preterm Abortions TAB SAB Ect Mult Living                 Review of Systems  Constitutional: Negative for fever and chills.  HENT: Negative for congestion.   Eyes: Negative for visual disturbance.  Respiratory: Negative for shortness of breath.    Cardiovascular: Positive for chest pain. Negative for leg swelling.  Gastrointestinal: Negative for vomiting and abdominal pain.  Genitourinary: Negative for dysuria and flank pain.  Musculoskeletal: Negative for back pain, neck pain and neck stiffness.  Skin: Negative for rash.  Neurological: Positive for light-headedness and headaches.      Allergies  Review of patient's allergies indicates no known allergies.  Home Medications   Prior to Admission medications   Medication Sig Start Date End Date Taking? Authorizing Provider  Multiple Vitamins-Minerals (MULTIVITAMIN WITH MINERALS) tablet Take 1 tablet by mouth as needed.   Yes Historical Provider, MD  naproxen sodium (ANAPROX) 220 MG tablet Take 440 mg by mouth 2 (two) times daily with a meal.   Yes Historical Provider, MD   BP 154/91  Pulse 88  Temp(Src) 98.1 F (36.7 C) (Oral)  Resp 17  SpO2 100% Physical Exam  Nursing note and vitals reviewed. Constitutional: She is oriented to person, place, and time. She appears well-developed and well-nourished.  HENT:  Head: Normocephalic and atraumatic.  Eyes: Conjunctivae are normal. Right eye exhibits no discharge. Left eye exhibits no discharge.  Neck: Normal range of motion. Neck supple. No tracheal deviation present.  Cardiovascular: Normal rate and regular rhythm.   Pulmonary/Chest: Effort normal and breath sounds normal.  Abdominal: Soft. She exhibits no distension. There is no tenderness. There is no guarding.  Musculoskeletal: She exhibits no edema and no tenderness.  Neurological: She is alert and oriented to person, place, and time. GCS eye subscore is 4. GCS  verbal subscore is 5. GCS motor subscore is 6.  5+ strength in UE and LE with f/e at major joints. Sensation to palpation intact in UE and LE. CNs 2-12 grossly intact.  EOMFI.  PERRL.     Visual fields intact to finger testing.   Skin: Skin is warm. No rash noted.  Psychiatric: She has a normal mood and affect.     ED Course  Procedures (including critical care time) Labs Review Labs Reviewed  BASIC METABOLIC PANEL - Abnormal; Notable for the following:    Sodium 134 (*)    GFR calc non Af Amer 64 (*)    GFR calc Af Amer 74 (*)    All other components within normal limits  TROPONIN I  CBC WITH DIFFERENTIAL  MAGNESIUM    Imaging Review Dg Chest 2 View  04/02/2014   CLINICAL DATA:  53 year old female with chest pain.  EXAM: CHEST  2 VIEW  COMPARISON:  04/19/2009 chest radiograph  FINDINGS: The cardiomediastinal silhouette is unremarkable.  There is no evidence of focal airspace disease, pulmonary edema, suspicious pulmonary nodule/mass, pleural effusion, or pneumothorax. No acute bony abnormalities are identified.  IMPRESSION: No active cardiopulmonary disease.   Electronically Signed   By: Hassan Rowan M.D.   On: 04/02/2014 14:32     EKG Interpretation   Date/Time:  Sunday April 02 2014 13:04:10 EDT Ventricular Rate:  54 PR Interval:  124 QRS Duration: 94 QT Interval:  608 QTC Calculation: 576 R Axis:   90 Text Interpretation:  Sinus rhythm Probable left atrial enlargement  Borderline right axis deviation Probable left ventricular hypertrophy  Nonspecific T abnrm, anterolateral leads Prolonged QT interval Confirmed  by Reather Converse  MD, Jerrin Recore ([id number]) on 04/02/2014 1:34:08 PM      MDM   Final diagnoses:  Abnormal EKG  Chest pain, unspecified chest pain type   Patient's headache frontal, gradual onset, no signs of meningitis in no suspicion for subarachnoid at this time. No headache currently. Patient low-risk chest pain however EKG does have nonspecific T wave abnormalities and prolonged QT. Discussed observation telemetry for stress test in the morning serial troponins. Patient has capacity make decisions, understands this may be her heart it does not want to stay in hospital and wishes to have stress test outpatient if troponin negative. Plan for blood work. Patient requesting to go home  and is now on a wait for further testing. As patient cardiology to help arrange a stress test however they've not called back yet and patient does not want to wait. I directed her to call cardiology and local Dr. to help her in stress test.  Results and differential diagnosis were discussed with the patient/parent/guardian. Close follow up outpatient was discussed, comfortable with the plan.   Medications  aspirin chewable tablet 324 mg (324 mg Oral Given 04/02/14 1347)    Filed Vitals:   04/02/14 1300  BP: 154/91  Pulse: 88  Temp: 98.1 F (36.7 C)  TempSrc: Oral  Resp: 17  SpO2: 100%   Diagnosis:       Mariea Clonts, MD 04/02/14 1513

## 2014-04-02 NOTE — ED Notes (Signed)
She c/o left-sided chest discomfort this morning at about 6 am which completely abated after she took some Aleve.  She states that later in the morning she experienced a "funny feeling around my eyes and was a little lightheaded".  Her skin is normal, warm and dry and she is breathing normally.  EKS performed at triage.

## 2014-07-10 ENCOUNTER — Ambulatory Visit: Payer: No Typology Code available for payment source | Attending: Family Medicine

## 2014-09-07 ENCOUNTER — Encounter (HOSPITAL_COMMUNITY): Payer: Self-pay | Admitting: Emergency Medicine

## 2014-09-07 ENCOUNTER — Emergency Department (HOSPITAL_COMMUNITY)
Admission: EM | Admit: 2014-09-07 | Discharge: 2014-09-07 | Disposition: A | Discharge status: Home / Self Care | Payer: No Typology Code available for payment source | Attending: Emergency Medicine | Admitting: Emergency Medicine

## 2014-09-07 DIAGNOSIS — Z791 Long term (current) use of non-steroidal anti-inflammatories (NSAID): Secondary | ICD-10-CM | POA: Insufficient documentation

## 2014-09-07 DIAGNOSIS — Z720 Tobacco use: Secondary | ICD-10-CM | POA: Insufficient documentation

## 2014-09-07 DIAGNOSIS — S39012A Strain of muscle, fascia and tendon of lower back, initial encounter: Secondary | ICD-10-CM | POA: Insufficient documentation

## 2014-09-07 DIAGNOSIS — Y92410 Unspecified street and highway as the place of occurrence of the external cause: Secondary | ICD-10-CM | POA: Insufficient documentation

## 2014-09-07 DIAGNOSIS — T148XXA Other injury of unspecified body region, initial encounter: Secondary | ICD-10-CM

## 2014-09-07 DIAGNOSIS — Y998 Other external cause status: Secondary | ICD-10-CM | POA: Insufficient documentation

## 2014-09-07 DIAGNOSIS — Y9389 Activity, other specified: Secondary | ICD-10-CM | POA: Insufficient documentation

## 2014-09-07 MED ORDER — IBUPROFEN 800 MG PO TABS: 800.0000 mg | ORAL_TABLET | Freq: Three times a day (TID) | ORAL | Status: DC

## 2014-09-07 MED ORDER — HYDROCODONE-ACETAMINOPHEN 5-325 MG PO TABS: 1.0000 | ORAL_TABLET | ORAL | Status: DC | PRN
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
## 2016-11-11 ENCOUNTER — Ambulatory Visit (INDEPENDENT_AMBULATORY_CARE_PROVIDER_SITE_OTHER): Payer: No Typology Code available for payment source | Admitting: Physician Assistant

## 2016-11-13 ENCOUNTER — Encounter (INDEPENDENT_AMBULATORY_CARE_PROVIDER_SITE_OTHER): Payer: Self-pay | Admitting: Physician Assistant

## 2016-11-13 ENCOUNTER — Ambulatory Visit (INDEPENDENT_AMBULATORY_CARE_PROVIDER_SITE_OTHER): Payer: Self-pay | Admitting: Physician Assistant

## 2016-11-13 VITALS — BP 151/89 | HR 61 | Temp 98.5°F | Ht 62.6 in | Wt 103.2 lb

## 2016-11-13 DIAGNOSIS — R030 Elevated blood-pressure reading, without diagnosis of hypertension: Secondary | ICD-10-CM

## 2016-11-13 DIAGNOSIS — Z0000 Encounter for general adult medical examination without abnormal findings: Secondary | ICD-10-CM

## 2016-11-13 LAB — POCT URINALYSIS DIPSTICK
Bilirubin, UA: NEGATIVE
GLUCOSE UA: NEGATIVE
Ketones, UA: NEGATIVE
LEUKOCYTES UA: NEGATIVE
Nitrite, UA: NEGATIVE
PROTEIN UA: NEGATIVE
SPEC GRAV UA: 1.015 (ref 1.030–1.035)
UROBILINOGEN UA: 0.2 (ref ?–2.0)
pH, UA: 6 (ref 5.0–8.0)

## 2016-11-13 NOTE — Patient Instructions (Addendum)
Return for your PAP in one to two weeks.  Pap Test Why am I having this test? A pap test is sometimes called a pap smear. It is a screening test that is used to check for signs of cancer of the vagina, cervix, and uterus. The test can also identify the presence of infection or precancerous changes. Your health care provider will likely recommend you have this test done on a regular basis. This test may be done:  Every 3 years, starting at age 57.  Every 5 years, in combination with testing for the presence of human papillomavirus (HPV).  More or less often depending on other medical conditions. What kind of sample is taken? Using a small cotton swab, plastic spatula, or brush, your health care provider will collect a sample of cells from the surface of your cervix. Your cervix is the opening to your uterus, also called a womb. Secretions from the cervix and vagina may also be collected. How do I prepare for this test?  Be aware of where you are in your menstrual cycle. You may be asked to reschedule the test if you are menstruating on the day of the test.  You may need to reschedule if you have a known vaginal infection on the day of the test.  You may be asked to avoid douching or taking a bath the day before or the day of the test.  Some medicines can cause abnormal test results, such as digitalis and tetracycline. Talk with your health care provider before your test if you take one of these medicines. What do the results mean? Abnormal test results may indicate a number of health conditions. These may include:  Cancer. Although pap test results cannot be used to diagnose cancer of the cervix, vagina, or uterus, they may suggest the possibility of cancer. Further tests would be required to determine if cancer is present.  Sexually transmitted disease.  Fungal infection.  Parasite infection.  Herpes infection.  A condition causing or contributing to infertility. It is your  responsibility to obtain your test results. Ask the lab or department performing the test when and how you will get your results. Contact your health care provider to discuss any questions you have about your results. Talk with your health care provider to discuss your results, treatment options, and if necessary, the need for more tests. Talk with your health care provider if you have any questions about your results. This information is not intended to replace advice given to you by your health care provider. Make sure you discuss any questions you have with your health care provider. Document Released: 11/01/2002 Document Revised: 04/16/2016 Document Reviewed: 01/02/2014 Elsevier Interactive Patient Education  2017 [employer].

## 2016-11-13 NOTE — Progress Notes (Signed)
Subjective:  Patient ID: Linda Spears, female    DOB: 03/24/1961  Age: 56 y.o. MRN: [id number]  CC: physical exam   HPI Linda Spears is a 56 y.o. female with no significant history presents for a general physical. Was told she needed to get a physical for the orange card. Reports feeling well despite being a smoker. Smokes a pack every 4 days. 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 Denies CP, SOB, HA, abdominal pain, f/c/n/v, rash, and GI/GU sxs.    Outpatient Medications Prior to Visit  Medication Sig Dispense Refill  . Multiple Vitamins-Minerals (MULTIVITAMIN WITH MINERALS) tablet Take 1 tablet by mouth as needed.    Marland Kitchen HYDROcodone-acetaminophen (NORCO/VICODIN) 5-325 MG per tablet Take 1 tablet by mouth every 4 (four) hours as needed. (Patient not taking: Reported on 11/13/2016) 6 tablet 0  . ibuprofen (ADVIL,MOTRIN) 800 MG tablet Take 1 tablet (800 mg total) by mouth 3 (three) times daily. (Patient not taking: Reported on 11/13/2016) 21 tablet 0   No facility-administered medications prior to visit.      ROS Review of Systems  Constitutional: Negative for chills, fever and malaise/fatigue.  Eyes: Negative for blurred vision.  Respiratory: Negative for shortness of breath.   Cardiovascular: Negative for chest pain and palpitations.  Gastrointestinal: Negative for abdominal pain and nausea.  Genitourinary: Negative for dysuria and hematuria.  Musculoskeletal: Negative for joint pain and myalgias.  Skin: Negative for rash.  Neurological: Negative for tingling and headaches.  Psychiatric/Behavioral: Negative for depression. The patient is not nervous/anxious.     Objective:  BP (!) 151/89   Pulse 61   Temp 98.5 F (36.9 C) (Oral)   Ht 5' 2.6" (1.59 m)   Wt 103 lb 3.2 oz (46.8 kg)   SpO2 100%   BMI 18.52 kg/m   BP/Weight 11/13/2016 7/82/9562 08/28/863  Systolic BP 784 696 295  Diastolic BP 89 70 89  Wt. (Lbs) 103.2 - -  BMI 18.52 - -       Physical Exam  Constitutional: She is oriented to person, place, and time.  Well developed, thin, NAD, polite  HENT:  Head: Normocephalic and atraumatic.  Mouth/Throat: No oropharyngeal exudate.  Eyes: Conjunctivae and EOM are normal. Pupils are equal, round, and reactive to light. No scleral icterus.  Neck: Normal range of motion. Neck supple. No JVD present. No thyromegaly present.  Cardiovascular: Normal rate, regular rhythm and normal heart sounds.   Pulmonary/Chest: Effort normal and breath sounds normal.  Abdominal: Soft. Bowel sounds are normal. She exhibits no distension and no mass. There is no tenderness. There is no rebound and no guarding.  Musculoskeletal: Normal range of motion. She exhibits no edema.  Lymphadenopathy:    She has no cervical adenopathy.  Neurological: She is alert and oriented to person, place, and time. She has normal reflexes. No cranial nerve deficit. Coordination normal.  Skin: Skin is warm and dry. No rash noted. No erythema. No pallor.  Psychiatric: She has a normal mood and affect. Her behavior is normal. Thought content normal.  Vitals reviewed.    Assessment & Plan:   1. Physical exam - CBC With Differential - Comprehensive metabolic panel - TSH - Lipid Panel - Urinalysis Dipstick - MS DIGITAL SCREENING BILATERAL; Future - Pt wants to return for PAP at another time.   2. Elevated blood-pressure reading without diagnosis of hypertension    Follow-up: Return in about 2 weeks (around 11/27/2016).   Clent Demark PA

## 2016-11-14 LAB — COMPREHENSIVE METABOLIC PANEL
A/G RATIO: 1.3 (ref 1.2–2.2)
ALBUMIN: 4.6 g/dL (ref 3.5–5.5)
ALT: 19 IU/L (ref 0–32)
AST: 25 IU/L (ref 0–40)
Alkaline Phosphatase: 94 IU/L (ref 39–117)
BUN/Creatinine Ratio: 9 (ref 9–23)
BUN: 9 mg/dL (ref 6–24)
Bilirubin Total: 0.3 mg/dL (ref 0.0–1.2)
CALCIUM: 10 mg/dL (ref 8.7–10.2)
CO2: 29 mmol/L (ref 18–29)
Chloride: 99 mmol/L (ref 96–106)
Creatinine, Ser: 0.97 mg/dL (ref 0.57–1.00)
GFR, EST AFRICAN AMERICAN: 76 mL/min/{1.73_m2} (ref >59)
GFR, EST NON AFRICAN AMERICAN: 65 mL/min/{1.73_m2} (ref >59)
GLOBULIN, TOTAL: 3.6 g/dL (ref 1.5–4.5)
Glucose: 107 mg/dL — ABNORMAL HIGH (ref 65–99)
Potassium: 5.3 mmol/L — ABNORMAL HIGH (ref 3.5–5.2)
SODIUM: 141 mmol/L (ref 134–144)
TOTAL PROTEIN: 8.2 g/dL (ref 6.0–8.5)

## 2016-11-14 LAB — CBC WITH DIFFERENTIAL
BASOS: 0 %
Basophils Absolute: 0 10*3/uL (ref 0.0–0.2)
EOS (ABSOLUTE): 0.3 10*3/uL (ref 0.0–0.4)
Eos: 4 %
HEMATOCRIT: 42 % (ref 34.0–46.6)
Hemoglobin: 13.4 g/dL (ref 11.1–15.9)
IMMATURE GRANS (ABS): 0 10*3/uL (ref 0.0–0.1)
Immature Granulocytes: 0 %
LYMPHS: 44 %
Lymphocytes Absolute: 2.8 10*3/uL (ref 0.7–3.1)
MCH: 27.7 pg (ref 26.6–33.0)
MCHC: 31.9 g/dL (ref 31.5–35.7)
MCV: 87 fL (ref 79–97)
MONOS ABS: 0.5 10*3/uL (ref 0.1–0.9)
Monocytes: 8 %
Neutrophils Absolute: 2.8 10*3/uL (ref 1.4–7.0)
Neutrophils: 44 %
RBC: 4.84 x10E6/uL (ref 3.77–5.28)
RDW: 15 % (ref 12.3–15.4)
WBC: 6.4 10*3/uL (ref 3.4–10.8)

## 2016-11-14 LAB — LIPID PANEL
Chol/HDL Ratio: 3.7 ratio units (ref 0.0–4.4)
Cholesterol, Total: 228 mg/dL — ABNORMAL HIGH (ref 100–199)
HDL: 62 mg/dL (ref >39)
LDL CALC: 136 mg/dL — AB (ref 0–99)
TRIGLYCERIDES: 148 mg/dL (ref 0–149)
VLDL Cholesterol Cal: 30 mg/dL (ref 5–40)

## 2016-11-14 LAB — TSH: TSH: 0.528 u[IU]/mL (ref 0.450–4.500)

## 2016-11-17 ENCOUNTER — Telehealth (INDEPENDENT_AMBULATORY_CARE_PROVIDER_SITE_OTHER): Payer: Self-pay | Admitting: Physician Assistant

## 2016-11-17 NOTE — Telephone Encounter (Signed)
I have already given instructions on the "result note".

## 2016-11-17 NOTE — Telephone Encounter (Signed)
Please advise on patient labs. Will call patient with results once directed by PCP. Nat Christen, CMA

## 2016-11-17 NOTE — Telephone Encounter (Signed)
Patient called to request lab results from last office visit Please follow up with patient

## 2016-11-27 ENCOUNTER — Ambulatory Visit (INDEPENDENT_AMBULATORY_CARE_PROVIDER_SITE_OTHER): Payer: No Typology Code available for payment source | Admitting: Physician Assistant

## 2016-12-04 ENCOUNTER — Encounter (INDEPENDENT_AMBULATORY_CARE_PROVIDER_SITE_OTHER): Payer: Self-pay | Admitting: Physician Assistant

## 2016-12-04 ENCOUNTER — Other Ambulatory Visit (INDEPENDENT_AMBULATORY_CARE_PROVIDER_SITE_OTHER): Payer: Self-pay | Admitting: Physician Assistant

## 2016-12-04 ENCOUNTER — Ambulatory Visit (INDEPENDENT_AMBULATORY_CARE_PROVIDER_SITE_OTHER): Payer: Self-pay | Admitting: Physician Assistant

## 2016-12-04 ENCOUNTER — Other Ambulatory Visit (HOSPITAL_COMMUNITY)
Admission: RE | Admit: 2016-12-04 | Discharge: 2016-12-04 | Disposition: A | Discharge status: Home / Self Care | Payer: No Typology Code available for payment source | Source: Ambulatory Visit | Attending: Physician Assistant | Admitting: Physician Assistant

## 2016-12-04 VITALS — BP 129/72 | HR 53 | Temp 98.1°F | Ht 62.0 in | Wt 101.2 lb

## 2016-12-04 DIAGNOSIS — Z1231 Encounter for screening mammogram for malignant neoplasm of breast: Secondary | ICD-10-CM

## 2016-12-04 DIAGNOSIS — Z01419 Encounter for gynecological examination (general) (routine) without abnormal findings: Secondary | ICD-10-CM | POA: Insufficient documentation

## 2016-12-04 DIAGNOSIS — Z23 Encounter for immunization: Secondary | ICD-10-CM

## 2016-12-04 DIAGNOSIS — Z124 Encounter for screening for malignant neoplasm of cervix: Secondary | ICD-10-CM

## 2016-12-04 MED ORDER — TETANUS-DIPHTH-ACELL PERTUSSIS 5-2.5-18.5 LF-MCG/0.5 IM SUSP
0.5000 mL | Freq: Once | INTRAMUSCULAR | Status: AC
  Administered 2016-12-04: 0.5 mL via INTRAMUSCULAR

## 2016-12-04 NOTE — Progress Notes (Signed)
Pt presents today for a pap smear Pt denies any pain today  Pt consents to Tdap vaccine

## 2016-12-04 NOTE — Progress Notes (Signed)
  Subjective:  Patient ID: Linda Spears, female    DOB: Sep 22, 1960  Age: 56 y.o. MRN: [id number]  CC: PAP smear  HPI Linda Spears is a 56 y.o. female with no significant PMH presents to have PAP smear and Mammogram done. She would also like to have Tdap vaccine today. Feels well, no complaints. Had HIV test done Oct 2017 which was negative and declines testing today.    ROS Review of Systems  Constitutional: Negative for chills, fever and malaise/fatigue.  Eyes: Negative for blurred vision.  Respiratory: Negative for shortness of breath.   Cardiovascular: Negative for chest pain and palpitations.  Gastrointestinal: Negative for abdominal pain and nausea.  Genitourinary: Negative for dysuria, flank pain, frequency, hematuria and urgency.  Musculoskeletal: Negative for joint pain and myalgias.  Skin: Negative for rash.  Neurological: Negative for tingling and headaches.  Psychiatric/Behavioral: Negative for depression. The patient is not nervous/anxious.     Objective:  BP 129/72 (BP Location: Left Arm, Patient Position: Sitting, Cuff Size: Small)   Pulse (!) 53   Temp 98.1 F (36.7 C) (Oral)   Ht 5\' 2"  (1.575 m)   Wt 101 lb 3.2 oz (45.9 kg)   SpO2 100%   BMI 18.51 kg/m   BP/Weight 12/04/2016 11/13/2016 03/13/9469  Systolic BP 962 836 629  Diastolic BP 72 89 70  Wt. (Lbs) 101.2 103.2 -  BMI 18.51 18.52 -      Physical Exam  Constitutional: She is oriented to person, place, and time.  Well developed, thin, NAD, polite  Eyes: No scleral icterus.  Abdominal: Soft. Bowel sounds are normal. There is no tenderness.  Genitourinary:  Genitourinary Comments: Vaginal walls somewhat atrophic, Cervix normal and with no cervical motion tenderness, no adnexal masses, uterus size normal.  Musculoskeletal: She exhibits no edema.  Neurological: She is alert and oriented to person, place, and time.  Skin: Skin is warm and dry. No rash noted. No erythema. No pallor.  Psychiatric:  She has a normal mood and affect. Her behavior is normal. Thought content normal.  Vitals reviewed.    Assessment & Plan:   1. Pap smear for cervical cancer screening - Cytology - PAP  2. Screening mammogram, encounter for - MM DIGITAL SCREENING BILATERAL; Future  3. Need for Tdap vaccination - Tdap (BOOSTRIX) injection 0.5 mL; Inject 0.5 mLs into the muscle once.   Meds ordered this encounter  Medications  . Tdap (BOOSTRIX) injection 0.5 mL    Follow-up: Return if symptoms worsen or fail to improve.   Clent Demark PA

## 2016-12-04 NOTE — Patient Instructions (Signed)

## 2016-12-05 LAB — CYTOLOGY - PAP: Diagnosis: NEGATIVE

## 2016-12-08 ENCOUNTER — Other Ambulatory Visit (INDEPENDENT_AMBULATORY_CARE_PROVIDER_SITE_OTHER): Payer: Self-pay | Admitting: Physician Assistant

## 2016-12-08 DIAGNOSIS — B9689 Other specified bacterial agents as the cause of diseases classified elsewhere: Secondary | ICD-10-CM

## 2016-12-08 DIAGNOSIS — N760 Acute vaginitis: Principal | ICD-10-CM

## 2016-12-08 MED ORDER — METRONIDAZOLE 500 MG PO TABS: 500.0000 mg | ORAL_TABLET | Freq: Two times a day (BID) | ORAL | 0 refills | Status: AC

## 2016-12-08 NOTE — Progress Notes (Signed)
BV found incidentally on PAP.

## 2017-02-17 ENCOUNTER — Ambulatory Visit: Payer: No Typology Code available for payment source

## 2017-02-23 ENCOUNTER — Ambulatory Visit: Payer: Self-pay | Attending: Physician Assistant

## 2017-03-10 ENCOUNTER — Ambulatory Visit (INDEPENDENT_AMBULATORY_CARE_PROVIDER_SITE_OTHER): Payer: Self-pay | Admitting: Physician Assistant

## 2017-03-10 ENCOUNTER — Encounter (INDEPENDENT_AMBULATORY_CARE_PROVIDER_SITE_OTHER): Payer: Self-pay | Admitting: Physician Assistant

## 2017-03-10 VITALS — BP 169/83 | HR 52 | Temp 98.3°F | Wt 101.0 lb

## 2017-03-10 DIAGNOSIS — Z111 Encounter for screening for respiratory tuberculosis: Secondary | ICD-10-CM

## 2017-03-10 NOTE — Progress Notes (Signed)
PPD placed by CMA Tempestt Roberts. Advised pt to return in between 86 and 72 hours for PPD interpretation.

## 2017-03-13 ENCOUNTER — Other Ambulatory Visit (INDEPENDENT_AMBULATORY_CARE_PROVIDER_SITE_OTHER): Payer: Self-pay

## 2017-03-13 LAB — TB SKIN TEST
INDURATION: 0 mm
TB SKIN TEST: NEGATIVE

## 2017-05-04 ENCOUNTER — Other Ambulatory Visit (HOSPITAL_COMMUNITY)
Admission: RE | Admit: 2017-05-04 | Discharge: 2017-05-04 | Disposition: A | Discharge status: Home / Self Care | Payer: Self-pay | Source: Ambulatory Visit | Attending: Physician Assistant | Admitting: Physician Assistant

## 2017-05-04 ENCOUNTER — Ambulatory Visit (INDEPENDENT_AMBULATORY_CARE_PROVIDER_SITE_OTHER): Payer: Self-pay | Admitting: Physician Assistant

## 2017-05-04 ENCOUNTER — Encounter (INDEPENDENT_AMBULATORY_CARE_PROVIDER_SITE_OTHER): Payer: Self-pay | Admitting: Physician Assistant

## 2017-05-04 VITALS — Resp 18 | Ht 62.0 in | Wt 103.0 lb

## 2017-05-04 DIAGNOSIS — R102 Pelvic and perineal pain: Secondary | ICD-10-CM | POA: Insufficient documentation

## 2017-05-04 DIAGNOSIS — N941 Unspecified dyspareunia: Secondary | ICD-10-CM

## 2017-05-04 DIAGNOSIS — B9689 Other specified bacterial agents as the cause of diseases classified elsewhere: Secondary | ICD-10-CM

## 2017-05-04 DIAGNOSIS — N760 Acute vaginitis: Secondary | ICD-10-CM

## 2017-05-04 LAB — POCT URINALYSIS DIPSTICK
BILIRUBIN UA: NEGATIVE
GLUCOSE UA: NEGATIVE
KETONES UA: NEGATIVE
Leukocytes, UA: NEGATIVE
Nitrite, UA: NEGATIVE
Protein, UA: NEGATIVE
UROBILINOGEN UA: 0.2 U/dL
pH, UA: 6 (ref 5.0–8.0)

## 2017-05-04 MED ORDER — METRONIDAZOLE 500 MG PO TABS: 500.0000 mg | ORAL_TABLET | Freq: Two times a day (BID) | ORAL | 0 refills | Status: AC

## 2017-05-04 MED ORDER — METRONIDAZOLE 500 MG PO TABS: 500.0000 mg | ORAL_TABLET | Freq: Two times a day (BID) | ORAL | 0 refills | Status: DC

## 2017-05-04 MED FILL — metroNIDAZOLE 500 MG TABS: 500 | 7 days supply | Qty: 14 | Fill #0

## 2017-05-04 NOTE — Patient Instructions (Signed)
Dyspareunia, Female Dyspareunia is pain that is associated with sexual activity. This can affect any part of the genitals or lower abdomen, and there are many possible causes. This condition ranges from mild to severe. Depending on the cause, dyspareunia may get better with treatment, or it may return (recur) over time. What are the causes? The cause of this condition is not always known. Possible causes include:  Cancer.  Psychological factors, such as depression, anxiety, or previous traumatic experiences.  Severe pain and tenderness of the skin around the vagina (vulva) when it is touched (vulvar vestibulitis syndrome).  Infection of the pelvis or the vulva.  Infection of the vagina.  Painful, involuntary tightening (contraction) of the vaginal muscles when anything is put inside the vagina (vaginismus).  Allergic reaction.  Ovarian cysts.  Solid growths of tissue (tumors) in the ovaries or the uterus.  Scar tissue in the ovaries, vagina, or pelvis.  Vaginal dryness.  Thinning of the tissue (atrophy) of the vulva and vagina.  Skin conditions that affect the vulva (vulvar dermatoses), such as lichen sclerosus or lichen planus.  Endometriosis.  Tubal pregnancy.  A tilted uterus.  Uterine prolapse.  Adhesions in the vagina.  Bladder problems.  Intestinal problems.  Certain medicines.  Medical conditions such as diabetes, arthritis, or thyroid disease.  What increases the risk? The following factors may make you more likely to develop this condition:  Having experienced physical or sexual trauma.  Having given birth more than once.  Taking birth control pills.  Having gone through menopause.  Having recently given birth, typically within the past 3-6 months.  Breastfeeding.  What are the signs or symptoms? The main symptom of this condition is pain in any part of the genitals or lower abdomen during or after sexual activity. This may include pain  during sexual arousal, genital stimulation, or orgasm. Pain may get worse when anything is inserted into the vagina, or when the genitals are touched in any way, such as when sitting or wearing pants. Pain can range from mild to severe, depending on the cause of the condition. In some cases, symptoms go away with treatment and return (recur) at a later date. How is this diagnosed? This condition may be diagnosed based on:  Your symptoms, including: ? Where your pain is located. ? When your pain occurs.  Your medical history.  A physical exam. This may include a pelvic exam and a Pap test. This is a screening test that is used to check for signs of cancer of the vagina, cervix, and uterus.  Tests, including: ? Blood tests. ? Ultrasound. This uses sound waves to make a picture of the area that is being tested. ? Urine culture. This test involves checking a urine sample for signs of infection. ? Culture test. This is when your health care provider uses a swab to collect a sample of vaginal fluid. The sample is checked for signs of infection. ? X-rays. ? MRI. ? CT scan. ? Laparoscopy. This is a procedure in which a small incision is made in your lower abdomen and a lighted, pencil-sized instrument (laparoscope) is passed through the incision and used to look inside your pelvis.  You may be referred to a health care provider who specializes in women's health (gynecologist). In some cases, diagnosing the cause of dyspareunia can be difficult. How is this treated? Treatment depends on the cause of your condition and your symptoms. In most cases, you may need to stop sexual activity until your symptoms   improve. Treatment may include:  Lubricants.  Kegel exercises or vaginal dilators.  Medicated skin creams.  Medicated vaginal creams.  Hormonal therapy.  Antibiotic medicine to prevent or fight infection.  Medicines that help to relieve pain.  Medicines that treat depression  (antidepressants).  Psychological counseling.  Sex therapy.  Surgery.  Follow these instructions at home: Lifestyle  Avoid tight clothing and irritating materials around your genital and abdominal area.  Use water-based lubricants as needed. Avoid oil-based lubricants.  Do not use any products that irritate you. This may include certain condoms, spermicides, lubricants, soaps, tampons, vaginal sprays, or douches.  Always practice safe sex. Talk with your health care provider about which form of birth control (contraception) is best for you.  Maintain open communication with your sexual partner. General instructions  Take over-the-counter and prescription medicines only as told by your health care provider.  If you had tests done, it is your responsibility to get your tests results. Ask your health care provider or the department performing the test when your results will be ready.  Urinate before you engage in sexual activity.  Consider joining a support group.  Keep all follow-up visits as told by your health care provider. This is important. Contact a health care provider if:  You develop vaginal bleeding after sexual intercourse.  You develop a lump at the opening of your vagina. Seek medical care even if the lump is painless.  You have: ? Abnormal vaginal discharge. ? Vaginal dryness. ? Itchiness or irritation of your vulva or vagina. ? A new rash. ? Symptoms that get worse or do not improve with treatment. ? A fever. ? Pain when you urinate. ? Blood in your urine. Get help right away if:  You develop severe pain in your abdomen during or shortly after sexual intercourse.  You pass out after having sexual intercourse. This information is not intended to replace advice given to you by your health care provider. Make sure you discuss any questions you have with your health care provider. Document Released: 08/31/2007 Document Revised: 12/21/2015 Document  Reviewed: 03/13/2015 Elsevier Interactive Patient Education  2018 [employer] Inc[employer]  

## 2017-05-04 NOTE — Progress Notes (Signed)
Subjective:  Patient ID: Linda Spears, female    DOB: October 01, 1960  Age: 56 y.o. MRN: [id number]  CC:   HPI Linda Spears is a 56 y.o. female with no significant medical history presents with pelvic pain attributed to dyspareunia.  Says her boyfriend was prescribed Metronidazole but does not know for what condition. Only symptom is dyspareunia. Boyfriend's penis is also abnormally large which she thinks may be contributing to pain. Denies vaginal discharge, genital lesions, pregnancy, fever, chills, nausea, vomiting, or any urinary symptoms.      Outpatient Medications Prior to Visit  Medication Sig Dispense Refill  . Multiple Vitamins-Minerals (MULTIVITAMIN WITH MINERALS) tablet Take 1 tablet by mouth as needed.     No facility-administered medications prior to visit.      ROS Review of Systems  Constitutional: Negative for chills, fever and malaise/fatigue.  Eyes: Negative for blurred vision.  Respiratory: Negative for shortness of breath.   Cardiovascular: Negative for chest pain and palpitations.  Gastrointestinal: Negative for abdominal pain and nausea.  Genitourinary: Negative for dysuria and hematuria.       Dyspareunia   Musculoskeletal: Negative for joint pain and myalgias.  Skin: Negative for rash.  Neurological: Negative for tingling and headaches.  Psychiatric/Behavioral: Negative for depression. The patient is not nervous/anxious.     Objective:  Resp 18   Ht 5\' 2"  (1.575 m)   Wt 103 lb (46.7 kg)   BMI 18.84 kg/m   BP/Weight 05/04/2017 03/10/2017 1/93/7902  Systolic BP - 409 735  Diastolic BP - 83 72  Wt. (Lbs) 103 101 101.2  BMI 18.84 18.47 18.51      Physical Exam  Constitutional: She is oriented to person, place, and time.  Well developed, well nourished, NAD, polite  HENT:  Head: Normocephalic and atraumatic.  Eyes: No scleral icterus.  Cardiovascular: Normal rate, regular rhythm and normal heart sounds.   Pulmonary/Chest: Effort normal and  breath sounds normal.  Abdominal: Soft. Bowel sounds are normal. There is no tenderness.  Musculoskeletal: She exhibits no edema.  Neurological: She is alert and oriented to person, place, and time.  Skin: Skin is warm and dry. No rash noted. No erythema. No pallor.  Psychiatric: She has a normal mood and affect. Her behavior is normal. Thought content normal.  Vitals reviewed.    Assessment & Plan:    1. Dyspareunia, female - Urine cytology ancillary only. Test for CT/GC, Trichomonas, Candida, and Gardnerella.   2. BV (bacterial vaginosis) - Urinalysis Dipstick negative for infection - metroNIDAZOLE (FLAGYL) 500 MG tablet; Take 1 tablet (500 mg total) by mouth 2 (two) times daily.  Dispense: 14 tablet; Refill: 0  3. Pelvic pain in female - Urine cytology ancillary only   Meds ordered this encounter  Medications  . DISCONTD: metroNIDAZOLE (FLAGYL) 500 MG tablet    Sig: Take 1 tablet (500 mg total) by mouth 2 (two) times daily.    Dispense:  14 tablet    Refill:  0    Order Specific Question:   Supervising Provider    Answer:   Tresa Garter [id number]  . metroNIDAZOLE (FLAGYL) 500 MG tablet    Sig: Take 1 tablet (500 mg total) by mouth 2 (two) times daily.    Dispense:  14 tablet    Refill:  0    Order Specific Question:   Supervising Provider    Answer:   Tresa Garter [id number]    Follow-up: Return in about 2 weeks (around 05/18/2017)  for f/u dyspareunia.   Clent Demark PA

## 2017-05-05 LAB — URINE CYTOLOGY ANCILLARY ONLY
Chlamydia: NEGATIVE
NEISSERIA GONORRHEA: NEGATIVE
TRICH (WINDOWPATH): NEGATIVE

## 2017-05-06 ENCOUNTER — Telehealth: Payer: Self-pay | Admitting: Physician Assistant

## 2017-05-06 NOTE — Telephone Encounter (Signed)
Patient is calling regarding her lab results  Please, call her back  Thanks

## 2017-05-07 LAB — URINE CYTOLOGY ANCILLARY ONLY
Bacterial vaginitis: NEGATIVE
CANDIDA VAGINITIS: NEGATIVE

## 2017-05-07 NOTE — Telephone Encounter (Signed)
Patient has been provided with lab results. Nat Christen, CMA

## 2017-05-19 ENCOUNTER — Ambulatory Visit (INDEPENDENT_AMBULATORY_CARE_PROVIDER_SITE_OTHER): Payer: Self-pay | Admitting: Physician Assistant

## 2017-05-26 ENCOUNTER — Ambulatory Visit (INDEPENDENT_AMBULATORY_CARE_PROVIDER_SITE_OTHER): Payer: Self-pay | Admitting: Physician Assistant

## 2017-06-03 ENCOUNTER — Ambulatory Visit (INDEPENDENT_AMBULATORY_CARE_PROVIDER_SITE_OTHER): Payer: Self-pay | Admitting: Physician Assistant

## 2017-06-11 ENCOUNTER — Encounter (INDEPENDENT_AMBULATORY_CARE_PROVIDER_SITE_OTHER): Payer: Self-pay | Admitting: Physician Assistant

## 2017-06-11 ENCOUNTER — Ambulatory Visit (INDEPENDENT_AMBULATORY_CARE_PROVIDER_SITE_OTHER): Payer: Self-pay | Admitting: Physician Assistant

## 2017-06-11 VITALS — BP 167/92 | HR 54 | Temp 97.8°F | Wt 105.0 lb

## 2017-06-11 DIAGNOSIS — Z114 Encounter for screening for human immunodeficiency virus [HIV]: Secondary | ICD-10-CM

## 2017-06-11 DIAGNOSIS — Z1159 Encounter for screening for other viral diseases: Secondary | ICD-10-CM

## 2017-06-11 DIAGNOSIS — Z1239 Encounter for other screening for malignant neoplasm of breast: Secondary | ICD-10-CM

## 2017-06-11 DIAGNOSIS — Z1211 Encounter for screening for malignant neoplasm of colon: Secondary | ICD-10-CM

## 2017-06-11 DIAGNOSIS — B379 Candidiasis, unspecified: Secondary | ICD-10-CM

## 2017-06-11 DIAGNOSIS — Z1231 Encounter for screening mammogram for malignant neoplasm of breast: Secondary | ICD-10-CM

## 2017-06-11 MED ORDER — FLUCONAZOLE 150 MG PO TABS: 150.0000 mg | ORAL_TABLET | Freq: Once | ORAL | 0 refills | Status: AC

## 2017-06-11 NOTE — Patient Instructions (Signed)

## 2017-06-11 NOTE — Progress Notes (Signed)
   Subjective:  Patient ID: Linda Spears, female    DOB: 02/13/1961  Age: 56 y.o. MRN: [id number]  CC: f/u dyspareunia  HPI Linda Spears is a 56 y.o. female with no significant medical history presents with complaint of dyspareunia. Says dyspareunia has resolved. Last PAP negative for malignancy on 12/04/16, although, BV was positive. Abx was taken and patient felt better. Thinks she may have a yeast infection that began 3 days ago after observing a white vaginal discharge.    Outpatient Medications Prior to Visit  Medication Sig Dispense Refill  . Multiple Vitamins-Minerals (MULTIVITAMIN WITH MINERALS) tablet Take 1 tablet by mouth as needed.     No facility-administered medications prior to visit.      ROS Review of Systems  Constitutional: Negative for chills, fever and malaise/fatigue.  Eyes: Negative for blurred vision.  Respiratory: Negative for shortness of breath.   Cardiovascular: Negative for chest pain and palpitations.  Gastrointestinal: Negative for abdominal pain and nausea.  Genitourinary: Negative for dysuria and hematuria.  Musculoskeletal: Negative for joint pain and myalgias.  Skin: Negative for rash.  Neurological: Negative for tingling and headaches.  Psychiatric/Behavioral: Negative for depression. The patient is not nervous/anxious.     Objective:  BP (!) 167/92 (BP Location: Left Arm, Patient Position: Sitting, Cuff Size: Normal)   Pulse (!) 54   Temp 97.8 F (36.6 C) (Oral)   Wt 105 lb (47.6 kg)   SpO2 100%   BMI 19.20 kg/m   BP/Weight 06/11/2017 05/04/2017 3/81/8299  Systolic BP 371 - 696  Diastolic BP 92 - 83  Wt. (Lbs) 105 103 101  BMI 19.2 18.84 18.47      Physical Exam  Constitutional: She is oriented to person, place, and time.  Well developed, well nourished, NAD, polite  HENT:  Head: Normocephalic and atraumatic.  Eyes: Conjunctivae are normal. No scleral icterus.  Neck: Normal range of motion. Neck supple. No thyromegaly  present.  Cardiovascular: Normal rate, regular rhythm and normal heart sounds.   Pulmonary/Chest: Effort normal and breath sounds normal.  Musculoskeletal: She exhibits no edema.  Neurological: She is alert and oriented to person, place, and time. No cranial nerve deficit. Coordination normal.  Skin: Skin is warm and dry. No rash noted. No erythema. No pallor.  Psychiatric: She has a normal mood and affect. Her behavior is normal. Thought content normal.  Vitals reviewed.    Assessment & Plan:    1. Yeast infection - Begin fluconazole 150 mg once #1 tablet  2. Encounter for screening for HIV - HIV antibody (with reflex)  3. Need for hepatitis C screening test - Hepatitis c antibody (reflex)  4. Special screening for malignant neoplasms, colon - Fecal occult blood, imunochemical  5. Screening for breast cancer - Pt declines order for mammogram today due to uncertainty with her work schedule. I asked for her to call in so I may order her mammogram.      Meds ordered this encounter  Medications  . fluconazole (DIFLUCAN) 150 MG tablet    Sig: Take 1 tablet (150 mg total) by mouth once.    Dispense:  1 tablet    Refill:  0    Order Specific Question:   Supervising Provider    Answer:   Tresa Garter [id number]    Follow-up: Return if symptoms worsen or fail to improve.   Clent Demark PA

## 2017-06-12 ENCOUNTER — Telehealth: Payer: Self-pay

## 2017-06-12 LAB — HCV COMMENT:

## 2017-06-12 LAB — HEPATITIS C ANTIBODY (REFLEX): HCV Ab: <0.1 s/co ratio (ref 0.0–0.9)

## 2017-06-12 LAB — HIV ANTIBODY (ROUTINE TESTING W REFLEX): HIV SCREEN 4TH GENERATION: NONREACTIVE

## 2017-06-12 NOTE — Telephone Encounter (Signed)
Left a message for patient detailing negative HIV and HCV results. Nat Christen, CMA

## 2017-06-12 NOTE — Telephone Encounter (Signed)
-----   Message from Clent Demark, PA-C sent at 06/12/2017 10:15 AM EDT ----- Negative HIV and HCV.

## 2017-10-29 ENCOUNTER — Ambulatory Visit (INDEPENDENT_AMBULATORY_CARE_PROVIDER_SITE_OTHER): Payer: Self-pay | Admitting: Physician Assistant

## 2017-10-29 ENCOUNTER — Other Ambulatory Visit: Payer: Self-pay

## 2017-10-29 ENCOUNTER — Encounter (INDEPENDENT_AMBULATORY_CARE_PROVIDER_SITE_OTHER): Payer: Self-pay | Admitting: Physician Assistant

## 2017-10-29 VITALS — BP 167/79 | HR 60 | Temp 98.1°F | Wt 100.2 lb

## 2017-10-29 DIAGNOSIS — R519 Headache, unspecified: Secondary | ICD-10-CM

## 2017-10-29 DIAGNOSIS — Z1211 Encounter for screening for malignant neoplasm of colon: Secondary | ICD-10-CM

## 2017-10-29 DIAGNOSIS — Z1231 Encounter for screening mammogram for malignant neoplasm of breast: Secondary | ICD-10-CM

## 2017-10-29 DIAGNOSIS — R51 Headache: Secondary | ICD-10-CM

## 2017-10-29 DIAGNOSIS — Z1239 Encounter for other screening for malignant neoplasm of breast: Secondary | ICD-10-CM

## 2017-10-29 DIAGNOSIS — R197 Diarrhea, unspecified: Secondary | ICD-10-CM

## 2017-10-29 DIAGNOSIS — R112 Nausea with vomiting, unspecified: Secondary | ICD-10-CM

## 2017-10-29 NOTE — Patient Instructions (Signed)

## 2017-10-29 NOTE — Progress Notes (Signed)
   Subjective:  Patient ID: Linda Spears, female    DOB: [birth year]  Age: 57 y.o. MRN: [id number]  CC: headache and emesis  HPI Linda Spears is a 57 y.o. female with no significant medical history presents with headache since last night that was associated with diarrhea, nausea, vomiting, abdominal pain in the hypogastric region, lightheadedness, fever, and chills. Attributed to eating seafood at a [place]. Most of the symptoms have subsided or resolved but mild left sided frontal headache persists. Has tried Pepto-Bismal with some relief of GI sxs and Sinu-Tab with mild relief. Says she had to miss work today and needs a work excuse.         Outpatient Medications Prior to Visit  Medication Sig Dispense Refill  . Multiple Vitamins-Minerals (MULTIVITAMIN WITH MINERALS) tablet Take 1 tablet by mouth as needed.     No facility-administered medications prior to visit.      ROS Review of Systems  Constitutional: Negative for chills, fever and malaise/fatigue.  Eyes: Negative for blurred vision.  Respiratory: Negative for shortness of breath.   Cardiovascular: Negative for chest pain and palpitations.  Gastrointestinal: Positive for abdominal pain, diarrhea, nausea and vomiting. Negative for blood in stool.  Genitourinary: Negative for dysuria and hematuria.  Musculoskeletal: Negative for joint pain and myalgias.  Skin: Negative for rash.  Neurological: Positive for headaches. Negative for tingling.  Psychiatric/Behavioral: Negative for depression. The patient is not nervous/anxious.     Objective:  Wt 100 lb 3.2 oz (45.5 kg)   BMI 18.33 kg/m   Vitals:   10/29/17 1340 10/29/17 1407  BP: (!) 182/92 (!) 167/79  Pulse: 60   Temp: 98.1 F (36.7 C)   SpO2: 100%     Physical Exam  Constitutional: She is oriented to person, place, and time.  Well developed, thin, NAD, polite  HENT:  Head: Normocephalic and atraumatic.  Eyes: No scleral icterus.  Pale  conjunctiva  Neck: Normal range of motion. Neck supple.  Cardiovascular: Normal rate, regular rhythm and normal heart sounds.  Pulmonary/Chest: Effort normal and breath sounds normal. No respiratory distress. She has no wheezes.  Abdominal: Soft. Bowel sounds are normal. There is tenderness (very mild RLQ TTP).  Musculoskeletal: She exhibits no edema.  Lymphadenopathy:    She has no cervical adenopathy.  Neurological: She is alert and oriented to person, place, and time. No cranial nerve deficit. Coordination normal.  Skin: Skin is warm and dry. No rash noted. No erythema. No pallor.  Psychiatric: She has a normal mood and affect. Her behavior is normal. Thought content normal.  Vitals reviewed.    Assessment & Plan:    1. Acute nonintractable headache, unspecified headache type - Rehydration advised.  2. Non-intractable vomiting with nausea, unspecified vomiting type - Minimal to non at this point. No treatment needed. Rehydration advised.  3. Diarrhea, unspecified type - Self resolved. No red flags. Rehydration advised.  4. Special screening for malignant neoplasms, colon - Reminded patient order is still active and she should get screening done as soon as she can.   5. Screening for breast cancer - Reminded patient order is still active and she should get screening done as soon as she can.   Follow-up: Return if symptoms worsen or fail to improve.   Clent Demark PA

## 2017-11-11 ENCOUNTER — Other Ambulatory Visit: Payer: Self-pay

## 2017-11-11 ENCOUNTER — Ambulatory Visit (HOSPITAL_COMMUNITY)
Admission: EM | Admit: 2017-11-11 | Discharge: 2017-11-11 | Disposition: A | Discharge status: Home / Self Care | Payer: Self-pay | Attending: Family Medicine | Admitting: Family Medicine

## 2017-11-11 ENCOUNTER — Encounter (HOSPITAL_COMMUNITY): Payer: Self-pay | Admitting: Emergency Medicine

## 2017-11-11 DIAGNOSIS — R51 Headache: Secondary | ICD-10-CM

## 2017-11-11 DIAGNOSIS — I10 Essential (primary) hypertension: Secondary | ICD-10-CM

## 2017-11-11 DIAGNOSIS — R519 Headache, unspecified: Secondary | ICD-10-CM

## 2017-11-11 MED ORDER — IBUPROFEN 800 MG PO TABS
ORAL_TABLET | ORAL | Status: AC
  Filled 2017-11-11: qty 1

## 2017-11-11 MED ORDER — IBUPROFEN 800 MG PO TABS
800.0000 mg | ORAL_TABLET | Freq: Once | ORAL | Status: AC
  Administered 2017-11-11: 800 mg via ORAL

## 2017-11-11 NOTE — ED Triage Notes (Signed)
Pt reports a headache that started yesterday, mildly relieved by ibuprofen.

## 2017-11-11 NOTE — Discharge Instructions (Signed)
Use anti-inflammatories for headache. You may take up to 800 mg Ibuprofen every 8 hours with food. You may supplement Ibuprofen with Tylenol [phone] mg every 8 hours.   Your blood pressure was elevated today in clinic. Please be sure to take blood pressure medications as prescribed. Please monitor your blood pressure at home or when you go to a [facility]. Please follow up with your primary care doctor to recheck blood pressure and discuss any need for medication changes.   Please go to Emergency Room if you start to experience severe headache, vision changes, decreased urine production, chest pain, shortness of breath, speech slurring, one sided weakness.

## 2017-11-11 NOTE — ED Provider Notes (Signed)
[place]    CSN: [id number] Arrival date & time: 11/11/17  1145     History   Chief Complaint Chief Complaint  Patient presents with  . Headache    HPI Linda Spears is a 57 y.o. female history of tobacco abuse presenting today with headache.  She states that yesterday while she was working she developed a headache that was associated with lightheadedness and blurred vision.  She took an ibuprofen which did relieve her symptoms, but this morning she woke up with a slight headache.  Vision is slightly improved from yesterday.  Denies worse headache of life, lack of vision, one-sided weakness, facial asymmetry, difficulty speaking.  Denies chest pain or shortness of breath.  Denies nausea or vomiting.  Patient does not wear contacts, but occasionally uses reading glasses.  Patient has had elevated blood pressure in the past, but has not been on medicines.  She has been working with her PCP to work on diet and exercise.  HPI  History reviewed. No pertinent past medical history.  Patient Active Problem List   Diagnosis Date Noted  . BV (bacterial vaginosis) 12/08/2016  . SMOKER 01/10/2010  . LUMBAGO 01/10/2010  . ELEVATED BP READING WITHOUT DX HYPERTENSION 01/10/2010    History reviewed. No pertinent surgical history.  OB History    No data available       Home Medications    Prior to Admission medications   Medication Sig Start Date End Date Taking? Authorizing Provider  Multiple Vitamins-Minerals (MULTIVITAMIN WITH MINERALS) tablet Take 1 tablet by mouth as needed.    [provider]    Family History History reviewed. No pertinent family history.  Social History Social History   Tobacco Use  . Smoking status: Current Every Day Smoker    Types: Cigarettes  . Smokeless tobacco: Never Used  Substance Use Topics  . Alcohol use: Yes    Comment: social  . Drug use: No     Allergies   Patient has no known allergies.   Review of  Systems Review of Systems  Constitutional: Negative for fatigue.  Eyes: Positive for visual disturbance.  Respiratory: Negative for shortness of breath.   Cardiovascular: Negative for chest pain.  Gastrointestinal: Negative for abdominal pain, nausea and vomiting.  Genitourinary: Negative for decreased urine volume.  Musculoskeletal: Negative for back pain and myalgias.  Neurological: Positive for light-headedness and headaches. Negative for dizziness, speech difficulty and weakness.     Physical Exam Triage Vital Signs ED Triage Vitals  Enc Vitals Group     BP 11/11/17 1238 (!) 193/100     Pulse Rate 11/11/17 1238 98     Resp --      Temp 11/11/17 1238 98.6 F (37 C)     Temp Source 11/11/17 1238 Oral     SpO2 11/11/17 1238 96 %     Weight --      Height --      Head Circumference --      Peak Flow --      Pain Score 11/11/17 1237 8     Pain Loc --      Pain Edu? --      Excl. in [place]? --    No data found.  Updated Vital Signs BP (!) 204/117 (BP Location: Right Arm)   Pulse 98   Temp 98.6 F (37 C) (Oral)   SpO2 96%  Blood pressure rechecked: 183/91 Visual Acuity Right Eye Distance:  20/25 Left Eye Distance:  20/25 Bilateral Distance:  20/25  Right Eye Near:   Left Eye Near:    Bilateral Near:     Physical Exam  Constitutional: She is oriented to person, place, and time. She appears well-developed and well-nourished. No distress.  Thin  HENT:  Head: Normocephalic and atraumatic.  Eyes: Conjunctivae and EOM are normal. Pupils are equal, round, and reactive to light.  Red reflex present bilaterally  Neck: Neck supple.  Cardiovascular: Normal rate and regular rhythm.  No murmur heard. Pulmonary/Chest: Effort normal and breath sounds normal. No respiratory distress.  CTA BL  Abdominal: Soft. There is no tenderness.  Musculoskeletal: She exhibits no edema.  Neurological: She is alert and oriented to person, place, and time. No cranial nerve deficit.  Coordination normal.  Skin: Skin is warm and dry.  Psychiatric: She has a normal mood and affect.  Nursing note and vitals reviewed.    UC Treatments / Results  Labs (all labs ordered are listed, but only abnormal results are displayed) Labs Reviewed - No data to display  EKG  EKG Interpretation None       Radiology No results found.  Procedures Procedures (including critical care time)  Medications Ordered in UC Medications  ibuprofen (ADVIL,MOTRIN) tablet 800 mg (not administered)     Initial Impression / Assessment and Plan / UC Course  I have reviewed the triage vital signs and the nursing notes.  Pertinent labs & imaging results that were available during my care of the patient were reviewed by me and considered in my medical decision making (see chart for details).     Patient with headache and elevated blood pressure.  No focal neuro deficits or red flags.  Will treat headache and follow-up with PCP for blood pressure monitoring.  Discussed signs and symptoms to go to emergency room.  Discussed if blood pressure remaining elevated similar to how it is today with persistent symptoms to go to emergency room. Discussed strict return precautions. Patient verbalized understanding and is agreeable with plan.   Final Clinical Impressions(s) / UC Diagnoses   Final diagnoses:  Hypertension, unspecified type  Acute nonintractable headache, unspecified headache type    ED Discharge Orders    None       Controlled Substance Prescriptions [place] Controlled Substance Registry consulted? Not Applicable   Janith Lima, [place] 11/11/17 1405

## 2017-11-15 ENCOUNTER — Ambulatory Visit (HOSPITAL_COMMUNITY)
Admission: EM | Admit: 2017-11-15 | Discharge: 2017-11-15 | Disposition: A | Discharge status: Home / Self Care | Payer: Self-pay | Attending: Internal Medicine | Admitting: Internal Medicine

## 2017-11-15 ENCOUNTER — Encounter (HOSPITAL_COMMUNITY): Payer: Self-pay | Admitting: Emergency Medicine

## 2017-11-15 DIAGNOSIS — K047 Periapical abscess without sinus: Secondary | ICD-10-CM

## 2017-11-15 MED ORDER — HYDROCODONE-ACETAMINOPHEN 5-325 MG PO TABS: 1.0000 | ORAL_TABLET | Freq: Four times a day (QID) | ORAL | 0 refills | Status: AC | PRN

## 2017-11-15 MED ORDER — AMOXICILLIN-POT CLAVULANATE 875-125 MG PO TABS: 1.0000 | ORAL_TABLET | Freq: Two times a day (BID) | ORAL | 0 refills | Status: AC

## 2017-11-15 MED ORDER — IBUPROFEN 800 MG PO TABS: 800.0000 mg | ORAL_TABLET | Freq: Three times a day (TID) | ORAL | 0 refills | Status: DC

## 2017-11-15 NOTE — ED Provider Notes (Signed)
[place]    CSN: [id number] Arrival date & time: 11/15/17  1220     History   Chief Complaint Chief Complaint  Patient presents with  . Dental Pain  . Abscess    HPI Linda Spears is a 57 y.o. female presenting today with concern for dental abscess.  States that she woke up with swelling to her left lower jaw.  She has a bad tooth there and has previously had an abscess that required draining.  She is taking ibuprofen 800.  Denies any changes in vision, congestion, ear pain.  Denies difficulty breathing or drooling or swallowing.  HPI  History reviewed. No pertinent past medical history.  Patient Active Problem List   Diagnosis Date Noted  . BV (bacterial vaginosis) 12/08/2016  . SMOKER 01/10/2010  . LUMBAGO 01/10/2010  . ELEVATED BP READING WITHOUT DX HYPERTENSION 01/10/2010    History reviewed. No pertinent surgical history.  OB History   None      Home Medications    Prior to Admission medications   Medication Sig Start Date End Date Taking? Authorizing Provider  amoxicillin-clavulanate (AUGMENTIN) 875-125 MG tablet Take 1 tablet by mouth every 12 (twelve) hours for 7 days. 11/15/17 11/22/17  Artrice Kraker C, PA-C  HYDROcodone-acetaminophen (NORCO/VICODIN) 5-325 MG tablet Take 1 tablet by mouth every 6 (six) hours as needed for up to 3 days. 11/15/17 11/18/17  Marigny Borre C, PA-C  ibuprofen (ADVIL,MOTRIN) 800 MG tablet Take 1 tablet (800 mg total) by mouth 3 (three) times daily. 11/15/17   Monique Hefty C, PA-C  Multiple Vitamins-Minerals (MULTIVITAMIN WITH MINERALS) tablet Take 1 tablet by mouth as needed.    [provider]    Family History No family history on file.  Social History Social History   Tobacco Use  . Smoking status: Current Every Day Smoker    Types: Cigarettes  . Smokeless tobacco: Never Used  Substance Use Topics  . Alcohol use: Yes    Comment: social  . Drug use: No     Allergies   Patient has no  known allergies.   Review of Systems Review of Systems  Constitutional: Negative for activity change, appetite change, fatigue and fever.  HENT: Positive for dental problem. Negative for ear pain and trouble swallowing.   Respiratory: Negative for shortness of breath.   Cardiovascular: Negative for chest pain.  Gastrointestinal: Negative for abdominal pain, nausea and vomiting.  Neurological: Positive for facial asymmetry. Negative for dizziness, weakness, light-headedness and headaches.     Physical Exam Triage Vital Signs ED Triage Vitals  Enc Vitals Group     BP 11/15/17 1255 (!) 156/79     Pulse Rate 11/15/17 1255 (!) 53     Resp 11/15/17 1255 16     Temp 11/15/17 1255 (!) 97.1 F (36.2 C)     Temp Source 11/15/17 1255 Oral     SpO2 11/15/17 1255 100 %     Weight --      Height --      Head Circumference --      Peak Flow --      Pain Score 11/15/17 1256 8     Pain Loc --      Pain Edu? --      Excl. in [place]? --    No data found.  Updated Vital Signs BP (!) 156/79 (BP Location: Left Arm) Comment: Notified Tina  Pulse (!) 53   Temp (!) 97.1 F (36.2 C) (Oral)  Resp 16   SpO2 100%   Visual Acuity Right Eye Distance:   Left Eye Distance:   Bilateral Distance:    Right Eye Near:   Left Eye Near:    Bilateral Near:     Physical Exam  Constitutional: She appears well-developed and well-nourished. No distress.  HENT:  Head: Normocephalic and atraumatic.  Obvious swelling to left lower jaw, tenderness to palpation of the lateral gingiva around #19.  Nontender to palpation of floor of mouth below the tongue.  Poor dentition diffusely.  Eyes: Conjunctivae are normal.  Neck: Neck supple.  Cardiovascular: Normal rate and regular rhythm.  No murmur heard. Pulmonary/Chest: Effort normal and breath sounds normal. No respiratory distress.  Abdominal: Soft. There is no tenderness.  Musculoskeletal: She exhibits no edema.  Neurological: She is alert.  Skin: Skin is  warm and dry.  Psychiatric: She has a normal mood and affect.  Nursing note and vitals reviewed.    UC Treatments / Results  Labs (all labs ordered are listed, but only abnormal results are displayed) Labs Reviewed - No data to display  EKG None Radiology No results found.  Procedures Procedures (including critical care time)  Medications Ordered in UC Medications - No data to display   Initial Impression / Assessment and Plan / UC Course  I have reviewed the triage vital signs and the nursing notes.  Pertinent labs & imaging results that were available during my care of the patient were reviewed by me and considered in my medical decision making (see chart for details).     Will begin on Augmentin for abscess.  Follow-up in emergency room if swelling persisting.  Ibuprofen and Tylenol for mild to moderate pain.  Hydrocodone for severe pain. Discussed strict return precautions. Patient verbalized understanding and is agreeable with plan.   Final Clinical Impressions(s) / UC Diagnoses   Final diagnoses:  Dental abscess    ED Discharge Orders        Ordered    amoxicillin-clavulanate (AUGMENTIN) 875-125 MG tablet  Every 12 hours     11/15/17 1317    ibuprofen (ADVIL,MOTRIN) 800 MG tablet  3 times daily     11/15/17 1317    HYDROcodone-acetaminophen (NORCO/VICODIN) 5-325 MG tablet  Every 6 hours PRN     11/15/17 1317       Controlled Substance Prescriptions [place] Controlled Substance Registry consulted? Not Applicable   Janith Lima, [place] 11/15/17 1325

## 2017-11-15 NOTE — Discharge Instructions (Signed)
Please use dental resource to contact offices to seek permenant treatment/relief.   Please begin Augmentin twice daily to help with the abscess/infection in your mouth.  For pain please take 600mg -800mg  of Ibuprofen every 8 hours, take with 1000 mg of Tylenol Extra strength every 8 hours. These are safe to take together. Please take with food.   I have also provided 3 days worth of stronger pain medication. This should only be used for severe pain. Do not drive or operate machinery while taking this medication.   Please return or go to emergency room if you start to experience significant swelling of your face, experiencing fever.

## 2017-11-15 NOTE — ED Notes (Signed)
Patient discharged by provider.

## 2017-11-15 NOTE — ED Triage Notes (Signed)
Pt c/o swelling and abscess to L side of face, states she has a bad tooth in that area.

## 2017-12-03 ENCOUNTER — Other Ambulatory Visit: Payer: Self-pay

## 2017-12-03 ENCOUNTER — Encounter (HOSPITAL_COMMUNITY): Payer: Self-pay | Admitting: Emergency Medicine

## 2017-12-03 ENCOUNTER — Ambulatory Visit (HOSPITAL_COMMUNITY)
Admission: EM | Admit: 2017-12-03 | Discharge: 2017-12-03 | Disposition: A | Discharge status: Home / Self Care | Payer: Self-pay | Attending: Internal Medicine | Admitting: Internal Medicine

## 2017-12-03 DIAGNOSIS — J302 Other seasonal allergic rhinitis: Secondary | ICD-10-CM

## 2017-12-03 MED ORDER — CETIRIZINE HCL 10 MG PO TABS
10.0000 mg | ORAL_TABLET | Freq: Every day | ORAL | 0 refills | Status: DC
Start: 2017-12-03 — End: 2018-03-26

## 2017-12-03 MED ORDER — IPRATROPIUM BROMIDE 0.06 % NA SOLN: 2.0000 | Freq: Four times a day (QID) | NASAL | 0 refills | Status: DC

## 2017-12-03 MED ORDER — FLUTICASONE PROPIONATE 50 MCG/ACT NA SUSP: 2.0000 | Freq: Every day | NASAL | 0 refills | Status: DC

## 2017-12-03 NOTE — ED Provider Notes (Signed)
[place]    CSN: [id number] Arrival date & time: 12/03/17  1046     History   Chief Complaint Chief Complaint  Patient presents with  . Facial Pain    HPI Linda Spears is a 57 y.o. female.   57 year old female comes in for 2 day history of rhinorrhea, nasal congestion, sneezing, facial pressure. She has also had eye itching and watering. She has had bilateral temporal/frontal headache that is piercing in nature, states has improved since yesterday. Denies photophobia, phonophobia, nausea, vomiting. Denies fever, chills, night sweats. She has tried sudafed with some relief.      History reviewed. No pertinent past medical history.  Patient Active Problem List   Diagnosis Date Noted  . BV (bacterial vaginosis) 12/08/2016  . SMOKER 01/10/2010  . LUMBAGO 01/10/2010  . ELEVATED BP READING WITHOUT DX HYPERTENSION 01/10/2010    History reviewed. No pertinent surgical history.  OB History   None      Home Medications    Prior to Admission medications   Medication Sig Start Date End Date Taking? Authorizing Provider  cetirizine (ZYRTEC) 10 MG tablet Take 1 tablet (10 mg total) by mouth daily. 12/03/17   Tasia Catchings, Amy V, PA-C  fluticasone (FLONASE) 50 MCG/ACT nasal spray Place 2 sprays into both nostrils daily. 12/03/17   Tasia Catchings, Amy V, PA-C  ibuprofen (ADVIL,MOTRIN) 800 MG tablet Take 1 tablet (800 mg total) by mouth 3 (three) times daily. 11/15/17   Wieters, Hallie C, PA-C  ipratropium (ATROVENT) 0.06 % nasal spray Place 2 sprays into both nostrils 4 (four) times daily. 12/03/17   Tasia Catchings, Amy V, PA-C  Multiple Vitamins-Minerals (MULTIVITAMIN WITH MINERALS) tablet Take 1 tablet by mouth as needed.    [provider]    Family History Family History  Problem Relation Age of Onset  . Hypertension Mother   . Migraines Mother     Social History Social History   Tobacco Use  . Smoking status: Current Every Day Smoker    Types: Cigarettes  . Smokeless  tobacco: Never Used  Substance Use Topics  . Alcohol use: Yes    Comment: social  . Drug use: No     Allergies   Patient has no known allergies.   Review of Systems Review of Systems  Reason unable to perform ROS: See HPI as above.     Physical Exam Triage Vital Signs ED Triage Vitals  Enc Vitals Group     BP 12/03/17 1149 139/80     Pulse Rate 12/03/17 1149 (!) 54     Resp --      Temp 12/03/17 1149 98.5 F (36.9 C)     Temp Source 12/03/17 1149 Oral     SpO2 12/03/17 1149 100 %     Weight --      Height --      Head Circumference --      Peak Flow --      Pain Score 12/03/17 1148 5     Pain Loc --      Pain Edu? --      Excl. in [place]? --    No data found.  Updated Vital Signs BP 139/80 (BP Location: Left Arm)   Pulse (!) 54   Temp 98.5 F (36.9 C) (Oral)   SpO2 100%   Physical Exam  Constitutional: She is oriented to person, place, and time. She appears well-developed and well-nourished. No distress.  HENT:  Head: Normocephalic and atraumatic.  Right  Ear: Tympanic membrane, external ear and ear canal normal. Tympanic membrane is not erythematous and not bulging.  Left Ear: Tympanic membrane, external ear and ear canal normal. Tympanic membrane is not erythematous and not bulging.  Nose: Rhinorrhea present. Right sinus exhibits frontal sinus tenderness. Right sinus exhibits no maxillary sinus tenderness. Left sinus exhibits frontal sinus tenderness. Left sinus exhibits no maxillary sinus tenderness.  Mouth/Throat: Uvula is midline, oropharynx is clear and moist and mucous membranes are normal.  Eyes: Pupils are equal, round, and reactive to light. Conjunctivae, EOM and lids are normal.  Neck: Normal range of motion. Neck supple.  Cardiovascular: Normal rate, regular rhythm and normal heart sounds. Exam reveals no gallop and no friction rub.  No murmur heard. Pulmonary/Chest: Effort normal and breath sounds normal. She has no decreased breath sounds. She has  no wheezes. She has no rhonchi. She has no rales.  Lymphadenopathy:    She has no cervical adenopathy.  Neurological: She is alert and oriented to person, place, and time.  Skin: Skin is warm and dry.  Psychiatric: She has a normal mood and affect. Her behavior is normal. Judgment normal.     UC Treatments / Results  Labs (all labs ordered are listed, but only abnormal results are displayed) Labs Reviewed - No data to display  EKG None Radiology No results found.  Procedures Procedures (including critical care time)  Medications Ordered in UC Medications - No data to display   Initial Impression / Assessment and Plan / UC Course  I have reviewed the triage vital signs and the nursing notes.  Pertinent labs & imaging results that were available during my care of the patient were reviewed by me and considered in my medical decision making (see chart for details).    Discussed with patient, symptoms more consistent with seasonal allergies. Start otc antihistamines. Other symptomatic treatment discussed. Push fluids. Return precautions given. Patient expresses understanding and agrees to plan.   Final Clinical Impressions(s) / UC Diagnoses   Final diagnoses:  Seasonal allergies    ED Discharge Orders        Ordered    fluticasone (FLONASE) 50 MCG/ACT nasal spray  Daily     12/03/17 1251    ipratropium (ATROVENT) 0.06 % nasal spray  4 times daily     12/03/17 1251    cetirizine (ZYRTEC) 10 MG tablet  Daily     12/03/17 1251        Ok Edwards, PA-C 12/03/17 1300

## 2017-12-03 NOTE — ED Triage Notes (Signed)
C/o head congestion, HA, rhinitis and itchy eyes onset 2 days

## 2017-12-03 NOTE — Discharge Instructions (Addendum)
Start flonase, atrovent nasal spray, zyrtec for nasal congestion/drainage. You can continue sudafed for the symptoms as well. You can use over the counter nasal saline rinse such as neti pot for nasal congestion. Keep hydrated, your urine should be clear to pale yellow in color. Tylenol/motrin for fever and pain. Monitor for any worsening of symptoms, chest pain, shortness of breath, wheezing, swelling of the throat, follow up for reevaluation.   You can do lid scrubs with baby shampoo and warm compresses, this can help with eye swelling and itching. I have attached some information for that.

## 2018-03-26 ENCOUNTER — Other Ambulatory Visit: Payer: Self-pay

## 2018-03-26 ENCOUNTER — Other Ambulatory Visit (HOSPITAL_COMMUNITY)
Admission: RE | Admit: 2018-03-26 | Discharge: 2018-03-26 | Disposition: A | Discharge status: Home / Self Care | Payer: Self-pay | Source: Ambulatory Visit | Attending: Physician Assistant | Admitting: Physician Assistant

## 2018-03-26 ENCOUNTER — Encounter (INDEPENDENT_AMBULATORY_CARE_PROVIDER_SITE_OTHER): Payer: Self-pay | Admitting: Physician Assistant

## 2018-03-26 ENCOUNTER — Ambulatory Visit (INDEPENDENT_AMBULATORY_CARE_PROVIDER_SITE_OTHER): Payer: Self-pay | Admitting: Physician Assistant

## 2018-03-26 VITALS — BP 175/78 | HR 51 | Temp 98.1°F | Ht 62.0 in | Wt 102.2 lb

## 2018-03-26 DIAGNOSIS — B009 Herpesviral infection, unspecified: Secondary | ICD-10-CM | POA: Insufficient documentation

## 2018-03-26 DIAGNOSIS — Z711 Person with feared health complaint in whom no diagnosis is made: Secondary | ICD-10-CM | POA: Insufficient documentation

## 2018-03-26 NOTE — Progress Notes (Signed)
   Subjective:  Patient ID: Linda Spears, female    DOB: [birth year]  Age: 57 y.o. MRN: [id number]  CC: vaginitis  HPI  Linda Spears is a 57 y.o. female with no significant medical history presents with mild concern for genital bacterial infection. She was told by her boyfriend that he was found to have a bacterial infection. Pt states she is asymptomatic. Denies vaginal discharge, genital lesions, pelvic pain, rash, f/c/n/v, abdominal pain, dysuria, CP, SOB, swelling, or GI sxs.     Outpatient Medications Prior to Visit  Medication Sig Dispense Refill  . cetirizine (ZYRTEC) 10 MG tablet Take 1 tablet (10 mg total) by mouth daily. (Patient not taking: Reported on 03/26/2018) 30 tablet 0  . fluticasone (FLONASE) 50 MCG/ACT nasal spray Place 2 sprays into both nostrils daily. (Patient not taking: Reported on 03/26/2018) 1 g 0  . ibuprofen (ADVIL,MOTRIN) 800 MG tablet Take 1 tablet (800 mg total) by mouth 3 (three) times daily. (Patient not taking: Reported on 03/26/2018) 30 tablet 0  . ipratropium (ATROVENT) 0.06 % nasal spray Place 2 sprays into both nostrils 4 (four) times daily. (Patient not taking: Reported on 03/26/2018) 15 mL 0  . Multiple Vitamins-Minerals (MULTIVITAMIN WITH MINERALS) tablet Take 1 tablet by mouth as needed.     No facility-administered medications prior to visit.      ROS Review of Systems  Constitutional: Negative for chills, fever and malaise/fatigue.  Eyes: Negative for blurred vision.  Respiratory: Negative for shortness of breath.   Cardiovascular: Negative for chest pain and palpitations.  Gastrointestinal: Negative for abdominal pain and nausea.  Genitourinary: Negative for dysuria and hematuria.  Musculoskeletal: Negative for joint pain and myalgias.  Skin: Negative for rash.  Neurological: Negative for tingling and headaches.  Psychiatric/Behavioral: Negative for depression. The patient is not nervous/anxious.     Objective:  BP (!) 175/78 (BP  Location: Left Arm, Patient Position: Sitting, Cuff Size: Normal)   Pulse (!) 51   Temp 98.1 F (36.7 C) (Oral)   Ht 5\' 2"  (1.575 m)   Wt 102 lb 3.2 oz (46.4 kg)   SpO2 100%   BMI 18.69 kg/m   BP/Weight 03/26/2018 12/03/2017 7/65/4650  Systolic BP 354 656 812  Diastolic BP 78 80 79  Wt. (Lbs) 102.2 - -  BMI 18.69 - -      Physical Exam  Constitutional: She is oriented to person, place, and time.  Well developed, thin, NAD, polite  HENT:  Head: Normocephalic and atraumatic.  Eyes: No scleral icterus.  Neck: Normal range of motion. Neck supple. No thyromegaly present.  Cardiovascular: Normal rate, regular rhythm and normal heart sounds.  Pulmonary/Chest: Effort normal and breath sounds normal.  Abdominal: Soft. Bowel sounds are normal. There is no tenderness.  Genitourinary:  Genitourinary Comments: deferred  Musculoskeletal: She exhibits no edema.  Neurological: She is alert and oriented to person, place, and time.  Skin: Skin is warm and dry. No rash noted. No erythema. No pallor.  Psychiatric: She has a normal mood and affect. Her behavior is normal. Thought content normal.  Vitals reviewed.    Assessment & Plan:    1. Concern about STD in female without diagnosis - Urine cytology ancillary only - HIV antibody - RPR - HSV(herpes simplex vrs) 1+2 ab-IgG     Follow-up: Return if symptoms worsen or fail to improve, for flu shot.   Clent Demark PA

## 2018-03-26 NOTE — Patient Instructions (Signed)
Sexually Transmitted Disease  A sexually transmitted disease (STD) is a disease or infection that may be passed (transmitted) from person to person, usually during sexual activity. This may happen by way of saliva, semen, blood, vaginal mucus, or urine. Common STDs include:   Gonorrhea.   Chlamydia.   Syphilis.   HIV and AIDS.   Genital herpes.   Hepatitis B and C.   Trichomonas.   Human papillomavirus (HPV).   Pubic lice.   Scabies.   Mites.   Bacterial vaginosis.    What are the causes?  An STD may be caused by bacteria, a virus, or parasites. STDs are often transmitted during sexual activity if one person is infected. However, they may also be transmitted through nonsexual means. STDs may be transmitted after:   Sexual intercourse with an infected person.   Sharing sex toys with an infected person.   Sharing needles with an infected person or using unclean piercing or tattoo needles.   Having intimate contact with the genitals, mouth, or rectal areas of an infected person.   Exposure to infected fluids during birth.    What are the signs or symptoms?  Different STDs have different symptoms. Some people may not have any symptoms. If symptoms are present, they may include:   Painful or bloody urination.   Pain in the pelvis, abdomen, vagina, anus, throat, or eyes.   A skin rash, itching, or irritation.   Growths, ulcerations, blisters, or sores in the genital and anal areas.   Abnormal vaginal discharge with or without bad odor.   Penile discharge in men.   Fever.   Pain or bleeding during sexual intercourse.   Swollen glands in the groin area.   Yellow skin and eyes (jaundice). This is seen with hepatitis.   Swollen testicles.   Infertility.   Sores and blisters in the mouth.    How is this diagnosed?  To make a diagnosis, your health care provider may:   Take a medical history.   Perform a physical exam.   Take a sample of any discharge to examine.   Swab the throat, cervix,  opening to the penis, rectum, or vagina for testing.   Test a sample of your first morning urine.   Perform blood tests.   Perform a Pap test, if this applies.   Perform a colposcopy.   Perform a laparoscopy.    How is this treated?  Treatment depends on the STD. Some STDs may be treated but not cured.   Chlamydia, gonorrhea, trichomonas, and syphilis can be cured with antibiotic medicine.   Genital herpes, hepatitis, and HIV can be treated, but not cured, with prescribed medicines. The medicines lessen symptoms.   Genital warts from HPV can be treated with medicine or by freezing, burning (electrocautery), or surgery. Warts may come back.   HPV cannot be cured with medicine or surgery. However, abnormal areas may be removed from the cervix, vagina, or vulva.   If your diagnosis is confirmed, your recent sexual partners need treatment. This is true even if they are symptom-free or have a negative culture or evaluation. They should not have sex until their health care providers say it is okay.   Your health care provider may test you for infection again 3 months after treatment.    How is this prevented?  Take these steps to reduce your risk of getting an STD:   Use latex condoms, dental dams, and water-soluble lubricants during sexual activity. Do not use   petroleum jelly or oils.   Avoid having multiple sex partners.   Do not have sex with someone who has other sex partners.   Do not have sex with anyone you do not know or who is at high risk for an STD.   Avoid risky sex practices that can break your skin.   Do not have sex if you have open sores on your mouth or skin.   Avoid drinking too much alcohol or taking illegal drugs. Alcohol and drugs can affect your judgment and put you in a vulnerable position.   Avoid engaging in oral and anal sex acts.   Get vaccinated for HPV and hepatitis. If you have not received these vaccines in the past, talk to your health care provider about whether one or  both might be right for you.   If you are at risk of being infected with HIV, it is recommended that you take a prescription medicine daily to prevent HIV infection. This is called pre-exposure prophylaxis (PrEP). You are considered at risk if:  ? You are a man who has sex with other men (MSM).  ? You are a heterosexual man or woman and are sexually active with more than one partner.  ? You take drugs by injection.  ? You are sexually active with a partner who has HIV.   Talk with your health care provider about whether you are at high risk of being infected with HIV. If you choose to begin PrEP, you should first be tested for HIV. You should then be tested every 3 months for as long as you are taking PrEP.    Contact a health care provider if:   See your health care provider.   Tell your sexual partner(s). They should be tested and treated for any STDs.   Do not have sex until your health care provider says it is okay.  Get help right away if:  Contact your health care provider right away if:   You have severe abdominal pain.   You are a man and notice swelling or pain in your testicles.   You are a woman and notice swelling or pain in your vagina.    This information is not intended to replace advice given to you by your health care provider. Make sure you discuss any questions you have with your health care provider.  Document Released: 11/01/2002 Document Revised: 02/29/2016 Document Reviewed: 03/01/2013  Elsevier Interactive Patient Education  2018 [employer]

## 2018-03-27 LAB — RPR: RPR: NONREACTIVE

## 2018-03-27 LAB — HSV(HERPES SIMPLEX VRS) I + II AB-IGG
HSV 1 Glycoprotein G Ab, IgG: 9.65 index — ABNORMAL HIGH (ref 0.00–0.90)
HSV 2 IgG, Type Spec: 11.6 index — ABNORMAL HIGH (ref 0.00–0.90)

## 2018-03-27 LAB — HIV ANTIBODY (ROUTINE TESTING W REFLEX): HIV SCREEN 4TH GENERATION: NONREACTIVE

## 2018-03-29 ENCOUNTER — Other Ambulatory Visit: Payer: Self-pay | Admitting: Obstetrics and Gynecology

## 2018-03-29 ENCOUNTER — Telehealth (INDEPENDENT_AMBULATORY_CARE_PROVIDER_SITE_OTHER): Payer: Self-pay

## 2018-03-29 DIAGNOSIS — Z1231 Encounter for screening mammogram for malignant neoplasm of breast: Secondary | ICD-10-CM

## 2018-03-29 LAB — URINE CYTOLOGY ANCILLARY ONLY
Chlamydia: NEGATIVE
Neisseria Gonorrhea: NEGATIVE
Trichomonas: NEGATIVE

## 2018-03-29 NOTE — Telephone Encounter (Signed)
Patient is aware of negative HIV, syphilis, gonorrhea, chlamydia and trichomonas. Positive Herpes simplex virus 1 and 2. Can not say for how long you've had this viral infection. HSV is not fatal. Patient expressed understanding. Nat Christen, CMA

## 2018-03-29 NOTE — Telephone Encounter (Signed)
-----   Message from Clent Demark, PA-C sent at 03/29/2018  1:39 PM EDT ----- HIV, Syphilis, CT/GC, and Trichomonas negative. Positive for Herpes Simplex Virus 1 and 2. Can not say for how long she has had this viral infection. HSV is not fatal.

## 2018-03-31 LAB — URINE CYTOLOGY ANCILLARY ONLY: Candida vaginitis: NEGATIVE

## 2018-04-01 ENCOUNTER — Other Ambulatory Visit (INDEPENDENT_AMBULATORY_CARE_PROVIDER_SITE_OTHER): Payer: Self-pay | Admitting: Physician Assistant

## 2018-04-01 DIAGNOSIS — B9689 Other specified bacterial agents as the cause of diseases classified elsewhere: Secondary | ICD-10-CM

## 2018-04-01 DIAGNOSIS — N760 Acute vaginitis: Principal | ICD-10-CM

## 2018-04-01 MED ORDER — METRONIDAZOLE 500 MG PO TABS: 500.0000 mg | ORAL_TABLET | Freq: Two times a day (BID) | ORAL | 0 refills | Status: AC

## 2018-04-02 ENCOUNTER — Telehealth (INDEPENDENT_AMBULATORY_CARE_PROVIDER_SITE_OTHER): Payer: Self-pay

## 2018-04-02 NOTE — Telephone Encounter (Signed)
-----   Message from Clent Demark, PA-C sent at 04/01/2018 12:47 PM EDT ----- Positive for BV. I sent abx to [facility].

## 2018-04-02 NOTE — Telephone Encounter (Signed)
Patient aware that urine cytology was positive for BV and antibiotic being sent to [facility] on [address], [place]

## 2018-05-17 ENCOUNTER — Ambulatory Visit (HOSPITAL_COMMUNITY)
Admission: EM | Admit: 2018-05-17 | Discharge: 2018-05-17 | Disposition: A | Discharge status: Home / Self Care | Payer: Self-pay | Attending: Family Medicine | Admitting: Family Medicine

## 2018-05-17 ENCOUNTER — Encounter (HOSPITAL_COMMUNITY): Payer: Self-pay | Admitting: Emergency Medicine

## 2018-05-17 ENCOUNTER — Telehealth (HOSPITAL_COMMUNITY): Payer: Self-pay | Admitting: *Deleted

## 2018-05-17 DIAGNOSIS — S161XXA Strain of muscle, fascia and tendon at neck level, initial encounter: Secondary | ICD-10-CM

## 2018-05-17 MED ORDER — DICLOFENAC SODIUM 75 MG PO TBEC: 75.0000 mg | DELAYED_RELEASE_TABLET | Freq: Two times a day (BID) | ORAL | 0 refills | Status: DC

## 2018-05-17 NOTE — ED Triage Notes (Signed)
Pt states Saturday she was at work and was pushing her cart and felt a pull in her R shoulder when she raised her arm. Pt kept working, tried to go to work today but when she lifts her arm she still feels the pulled muscle in the side of her neck.

## 2018-05-17 NOTE — ED Provider Notes (Signed)
[place]    CSN: [id number] Arrival date & time: 05/17/18  9622     History   Chief Complaint Chief Complaint  Patient presents with  . Neck Pain    HPI Linda Spears is a 57 y.o. female.    Pt states Saturday she was at work and was pushing her cart and felt a pull in her R shoulder when she raised her arm. Pt kept working, tried to go to work today but when she lifts her arm she still feels the pulled muscle in the side of her neck.   She states that her weakness and numbness and soreness in the trapezius area of her right shoulder is getting better such that she can raise her arm easily now.  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         History reviewed. No pertinent past medical history.  Patient Active Problem List   Diagnosis Date Noted  . BV (bacterial vaginosis) 12/08/2016  . SMOKER 01/10/2010  . LUMBAGO 01/10/2010  . ELEVATED BP READING WITHOUT DX HYPERTENSION 01/10/2010    History reviewed. No pertinent surgical history.  OB History   None      Home Medications    Prior to Admission medications   Medication Sig Start Date End Date Taking? Authorizing Provider  diclofenac (VOLTAREN) 75 MG EC tablet Take 1 tablet (75 mg total) by mouth 2 (two) times daily. 05/17/18   Robyn Haber, MD  Multiple Vitamins-Minerals (MULTIVITAMIN WITH MINERALS) tablet Take 1 tablet by mouth as needed.    [provider]    Family History Family History  Problem Relation Age of Onset  . Hypertension Mother   . Migraines Mother     Social History Social History   Tobacco Use  . Smoking status: Current Every Day Smoker    Types: Cigarettes  . Smokeless tobacco: Never Used  Substance Use Topics  . Alcohol use: Yes    Comment: social  . Drug use: No     Allergies   Patient has no known allergies.   Review of Systems Review of Systems    Constitutional: Negative.   Musculoskeletal: Positive for neck stiffness.  All other systems reviewed and are negative.    Physical Exam Triage Vital Signs ED Triage Vitals [05/17/18 0831]  Enc Vitals Group     BP (!) 153/95     Pulse Rate (!) 48     Resp 16     Temp 98.2 F (36.8 C)     Temp Source Oral     SpO2 100 %     Weight      Height      Head Circumference      Peak Flow      Pain Score      Pain Loc      Pain Edu?      Excl. in [place]?    No data found.  Updated Vital Signs BP (!) 153/95   Pulse (!) 48   Temp 98.2 F (36.8 C) (Oral)   Resp 16   SpO2 100%   Visual Acuity Right Eye Distance:   Left Eye Distance:   Bilateral Distance:    Right Eye Near:   Left Eye Near:    Bilateral Near:     Physical Exam  Constitutional: She is oriented to person, place, and time. She appears well-developed and well-nourished.  HENT:  Right Ear: External ear normal.  Left Ear: External ear normal.  Mouth/Throat: Oropharynx is clear and moist.  Eyes: Pupils are equal, round, and reactive to light. Conjunctivae are normal.  Neck: Normal range of motion. Neck supple.  Some tenderness in the right trapezius.  Pulmonary/Chest: Effort normal.  Musculoskeletal: Normal range of motion.  Good arm and hand strength  Neurological: She is alert and oriented to person, place, and time. A sensory deficit is present.  Patient perceives some numbness in the supraclavicular area.  Skin: Skin is warm and dry.  Nursing note and vitals reviewed.    UC Treatments / Results  Labs (all labs ordered are listed, but only abnormal results are displayed) Labs Reviewed - No data to display  EKG None  Radiology No results found.  Procedures Procedures (including critical care time)  Medications Ordered in UC Medications - No data to display  Initial Impression / Assessment and Plan / UC Course  I have reviewed the triage vital signs and the nursing notes.  Pertinent labs  & imaging results that were available during my care of the patient were reviewed by me and considered in my medical decision making (see chart for details).    Final Clinical Impressions(s) / UC Diagnoses   Final diagnoses:  Strain of neck muscle, initial encounter   Discharge Instructions   None    ED Prescriptions    Medication Sig Dispense Auth. Provider   diclofenac (VOLTAREN) 75 MG EC tablet Take 1 tablet (75 mg total) by mouth 2 (two) times daily. 14 tablet Robyn Haber, MD     Controlled Substance Prescriptions  Controlled Substance Registry consulted? Not Applicable   Robyn Haber, MD 05/17/18 [phone]

## 2018-05-17 NOTE — Telephone Encounter (Signed)
Telephoned patient at home number and left message to return call to [facility] 

## 2018-05-18 ENCOUNTER — Telehealth (HOSPITAL_COMMUNITY): Payer: Self-pay | Admitting: *Deleted

## 2018-05-18 NOTE — Telephone Encounter (Signed)
Telephoned patient at home number and left message to return call to [facility] 

## 2018-05-20 ENCOUNTER — Ambulatory Visit (HOSPITAL_COMMUNITY): Payer: Self-pay

## 2018-08-05 ENCOUNTER — Ambulatory Visit (HOSPITAL_COMMUNITY)
Admission: EM | Admit: 2018-08-05 | Discharge: 2018-08-05 | Disposition: A | Discharge status: Home / Self Care | Payer: Self-pay | Attending: Family Medicine | Admitting: Family Medicine

## 2018-08-05 ENCOUNTER — Encounter (HOSPITAL_COMMUNITY): Payer: Self-pay

## 2018-08-05 ENCOUNTER — Ambulatory Visit (HOSPITAL_COMMUNITY): Payer: Self-pay

## 2018-08-05 ENCOUNTER — Ambulatory Visit (INDEPENDENT_AMBULATORY_CARE_PROVIDER_SITE_OTHER): Payer: Self-pay

## 2018-08-05 DIAGNOSIS — J069 Acute upper respiratory infection, unspecified: Secondary | ICD-10-CM

## 2018-08-05 MED ORDER — BENZONATATE 100 MG PO CAPS: 100.0000 mg | ORAL_CAPSULE | Freq: Three times a day (TID) | ORAL | 0 refills | Status: DC | PRN

## 2018-08-05 MED ORDER — PREDNISONE 50 MG PO TABS: ORAL_TABLET | ORAL | 0 refills | Status: DC

## 2018-08-05 NOTE — Discharge Instructions (Addendum)
Return if symptoms do not start to resolve in 2 days.  Your chest x-ray shows no pneumonia

## 2018-08-05 NOTE — ED Provider Notes (Signed)
[name]    CSN: [id number] Arrival date & time: 08/05/18  6811     History   Chief Complaint Chief Complaint  Patient presents with  . Generalized Body Aches    HPI Linda Spears is a 57 y.o. female.   This a 57 year old established [facility] urgent care patient who presents with generalized body aches with headaches and fatigue that started on Tuesday.  She works in a rehab unit.  She thinks she has been exposed to pneumonia.  She has had some hot and cold spells and productive cough over the last 3 days.  She is not short of breath and has no chest pain.  She has lost her appetite but has no significant abdominal pain, nausea, vomiting, or diarrhea.  She is feeling a little lightheaded at times, but is able to keep down fluids.     History reviewed. No pertinent past medical history.  Patient Active Problem List   Diagnosis Date Noted  . BV (bacterial vaginosis) 12/08/2016  . SMOKER 01/10/2010  . LUMBAGO 01/10/2010  . ELEVATED BP READING WITHOUT DX HYPERTENSION 01/10/2010    History reviewed. No pertinent surgical history.  OB History   No obstetric history on file.      Home Medications    Prior to Admission medications   Medication Sig Start Date End Date Taking? Authorizing Provider  benzonatate (TESSALON) 100 MG capsule Take 1-2 capsules (100-200 mg total) by mouth 3 (three) times daily as needed for cough. 08/05/18   Robyn Haber, MD  Multiple Vitamins-Minerals (MULTIVITAMIN WITH MINERALS) tablet Take 1 tablet by mouth as needed.    [provider]  predniSONE (DELTASONE) 50 MG tablet One daily with food 08/05/18   Robyn Haber, MD    Family History Family History  Problem Relation Age of Onset  . Hypertension Mother   . Migraines Mother     Social History Social History   Tobacco Use  . Smoking status: Current Every Day Smoker    Types: Cigarettes  . Smokeless tobacco: Never Used  Substance Use Topics  .  Alcohol use: Yes    Comment: social  . Drug use: No     Allergies   Patient has no known allergies.   Review of Systems Review of Systems   Physical Exam Triage Vital Signs ED Triage Vitals [08/05/18 1015]  Enc Vitals Group     BP 117/77     Pulse Rate 81     Resp 16     Temp 98.7 F (37.1 C)     Temp Source Oral     SpO2 100 %     Weight      Height      Head Circumference      Peak Flow      Pain Score 8     Pain Loc      Pain Edu?      Excl. in [place]?    No data found.  Updated Vital Signs BP 117/77 (BP Location: Right Arm)   Pulse 81   Temp 98.7 F (37.1 C) (Oral)   Resp 16   SpO2 100%    Physical Exam Vitals signs and nursing note reviewed.  Constitutional:      Appearance: Normal appearance.  HENT:     Head: Normocephalic and atraumatic.     Right Ear: Tympanic membrane, ear canal and external ear normal.     Left Ear: Tympanic membrane, ear canal and external ear  normal.     Nose: Nose normal.     Mouth/Throat:     Mouth: Mucous membranes are moist.  Eyes:     Conjunctiva/sclera: Conjunctivae normal.     Pupils: Pupils are equal, round, and reactive to light.  Neck:     Musculoskeletal: Normal range of motion and neck supple.  Cardiovascular:     Rate and Rhythm: Normal rate and regular rhythm.     Heart sounds: Normal heart sounds.  Pulmonary:     Effort: Pulmonary effort is normal.     Breath sounds: Rhonchi and rales present.  Abdominal:     General: Bowel sounds are normal.     Palpations: Abdomen is soft.     Tenderness: There is no abdominal tenderness.  Musculoskeletal: Normal range of motion.  Skin:    General: Skin is warm and dry.  Neurological:     General: No focal deficit present.     Mental Status: She is alert and oriented to person, place, and time.  Psychiatric:        Mood and Affect: Mood normal.        Behavior: Behavior normal.        Thought Content: Thought content normal.        Judgment: Judgment normal.       UC Treatments / Results  Labs (all labs ordered are listed, but only abnormal results are displayed) Labs Reviewed - No data to display  EKG None  Radiology Dg Chest 2 View  Result Date: 08/05/2018 CLINICAL DATA:  Cough with headache and fatigue EXAM: CHEST - 2 VIEW COMPARISON:  April 02, 2014 FINDINGS: No edema or consolidation. Heart size and pulmonary vascularity are normal. No adenopathy. No pneumothorax. No bone lesions. IMPRESSION: No edema or consolidation. Electronically Signed   By: Lowella Grip III M.D.   On: 08/05/2018 10:59    Procedures Procedures (including critical care time)  Medications Ordered in UC Medications - No data to display  Initial Impression / Assessment and Plan / UC Course  I have reviewed the triage vital signs and the nursing notes.  Pertinent labs & imaging results that were available during my care of the patient were reviewed by me and considered in my medical decision making (see chart for details).    Final Clinical Impressions(s) / UC Diagnoses   Final diagnoses:  Upper respiratory tract infection, unspecified type     Discharge Instructions     Return if symptoms do not start to resolve in 2 days.  Your chest x-ray shows no pneumonia    ED Prescriptions    Medication Sig Dispense Auth. Provider   predniSONE (DELTASONE) 50 MG tablet One daily with food 5 tablet Robyn Haber, MD   benzonatate (TESSALON) 100 MG capsule Take 1-2 capsules (100-200 mg total) by mouth 3 (three) times daily as needed for cough. 30 capsule Robyn Haber, MD     Controlled Substance Prescriptions [place] Controlled Substance Registry consulted? Not Applicable   Robyn Haber, MD 08/05/18 1118

## 2018-08-05 NOTE — ED Triage Notes (Signed)
Pt present generalized body aches with headaches and fatigue that started on Tuesday.

## 2018-08-10 ENCOUNTER — Other Ambulatory Visit (HOSPITAL_COMMUNITY): Payer: Self-pay | Admitting: *Deleted

## 2018-08-10 DIAGNOSIS — Z1231 Encounter for screening mammogram for malignant neoplasm of breast: Secondary | ICD-10-CM

## 2018-11-04 ENCOUNTER — Ambulatory Visit (HOSPITAL_COMMUNITY): Payer: Self-pay

## 2018-11-15 ENCOUNTER — Other Ambulatory Visit (HOSPITAL_COMMUNITY): Payer: Self-pay | Admitting: *Deleted

## 2018-11-15 DIAGNOSIS — Z1231 Encounter for screening mammogram for malignant neoplasm of breast: Secondary | ICD-10-CM

## 2019-01-27 ENCOUNTER — Ambulatory Visit
Admission: RE | Admit: 2019-01-27 | Discharge: 2019-01-27 | Disposition: A | Discharge status: Home / Self Care | Payer: No Typology Code available for payment source | Source: Ambulatory Visit | Attending: Obstetrics and Gynecology | Admitting: Obstetrics and Gynecology

## 2019-01-27 ENCOUNTER — Ambulatory Visit (HOSPITAL_COMMUNITY)
Admission: RE | Admit: 2019-01-27 | Discharge: 2019-01-27 | Disposition: A | Discharge status: Home / Self Care | Payer: Self-pay | Source: Ambulatory Visit | Attending: Obstetrics and Gynecology | Admitting: Obstetrics and Gynecology

## 2019-01-27 ENCOUNTER — Ambulatory Visit: Payer: Self-pay

## 2019-01-27 ENCOUNTER — Other Ambulatory Visit: Payer: Self-pay

## 2019-01-27 ENCOUNTER — Encounter (HOSPITAL_COMMUNITY): Payer: Self-pay

## 2019-01-27 ENCOUNTER — Ambulatory Visit (HOSPITAL_COMMUNITY): Payer: Self-pay

## 2019-01-27 ENCOUNTER — Other Ambulatory Visit (HOSPITAL_COMMUNITY): Payer: Self-pay | Admitting: *Deleted

## 2019-01-27 ENCOUNTER — Other Ambulatory Visit (HOSPITAL_COMMUNITY): Payer: Self-pay | Admitting: Obstetrics and Gynecology

## 2019-01-27 VITALS — BP 148/96 | Temp 98.1°F | Wt 116.0 lb

## 2019-01-27 DIAGNOSIS — N6320 Unspecified lump in the left breast, unspecified quadrant: Secondary | ICD-10-CM

## 2019-01-27 DIAGNOSIS — Z1239 Encounter for other screening for malignant neoplasm of breast: Secondary | ICD-10-CM

## 2019-01-27 NOTE — Patient Instructions (Signed)
Explained breast self awareness with [name]. Patient did not need a Pap smear today due to last Pap smear was 12/04/2016. Let her know [facility] will cover Pap smears every 3 years unless has a history of abnormal Pap smears. Referred patient to the [facility] for a diagnostic mammogram and left breast ultrasound. Appointment scheduled for Thursday, January 27, 2019 at 1050. Patient aware of appointment and will be there. [name] verbalized understanding.  Izael Bessinger, Arvil Chaco, RN 9:31 AM

## 2019-01-27 NOTE — Progress Notes (Signed)
Complaints of left breast lump since November 2019 that is painful at times. Patient rates the pain at a 3 out of 10.  Pap Smear: Pap smear not completed today. Last Pap smear was 12/04/2016 at [facility] and normal. Per patient has no history of an abnormal Pap smear. Last Pap smear result is in Epic.  Physical exam: Breasts Breasts symmetrical. No skin abnormalities bilateral breasts. No nipple retraction bilateral breasts. No nipple discharge bilateral breasts. No lymphadenopathy. No lumps palpated right breast. Palpated a lump within the left breast between 8 o'clock and 10 o'clock next to nipple. No complaints of pain or tenderness on exam. Referred patient to the [facility] for a diagnostic mammogram and left breast ultrasound. Appointment scheduled for Thursday, January 27, 2019 at 1050.        Pelvic/Bimanual No Pap smear completed today since last Pap smear was 12/04/2016. Pap smear not indicated per [facility] guidelines.   Smoking History: Patient has never smoked.  Patient Navigation: Patient education provided. Access to services provided for patient through [facility] program.   Colorectal Cancer Screening: Per patient has never had a colonoscopy completed. No complaints today.   Breast and Cervical Cancer Risk Assessment: Patient has no family history of breast cancer, known genetic mutations, or radiation treatment to the chest before age 32. Patient has no history of cervical dysplasia, immunocompromised, or DES exposure in-utero.  Risk Assessment    Risk Scores      01/27/2019   Last edited by: Armond Hang, LPN   5-year risk: 1.1 %   Lifetime risk: 5.6 %

## 2019-01-28 ENCOUNTER — Encounter (HOSPITAL_COMMUNITY): Payer: Self-pay | Admitting: *Deleted

## 2019-02-02 ENCOUNTER — Ambulatory Visit
Admission: RE | Admit: 2019-02-02 | Discharge: 2019-02-02 | Disposition: A | Discharge status: Home / Self Care | Payer: No Typology Code available for payment source | Source: Ambulatory Visit | Attending: Obstetrics and Gynecology | Admitting: Obstetrics and Gynecology

## 2019-02-02 ENCOUNTER — Other Ambulatory Visit: Payer: Self-pay

## 2019-02-02 DIAGNOSIS — N6320 Unspecified lump in the left breast, unspecified quadrant: Secondary | ICD-10-CM

## 2019-02-03 ENCOUNTER — Encounter: Payer: Self-pay | Admitting: *Deleted

## 2019-02-03 ENCOUNTER — Telehealth: Payer: Self-pay | Admitting: Hematology and Oncology

## 2019-02-03 NOTE — Telephone Encounter (Signed)
Spoke to patient to confirm morning [facility] appointment, packet will be mailed to patient

## 2019-02-07 ENCOUNTER — Other Ambulatory Visit: Payer: Self-pay | Admitting: *Deleted

## 2019-02-07 DIAGNOSIS — C50212 Malignant neoplasm of upper-inner quadrant of left female breast: Secondary | ICD-10-CM | POA: Insufficient documentation

## 2019-02-08 NOTE — Progress Notes (Signed)
[place] NOTE  Patient Care Team: Tawny Asal as PCP - General (Physician Assistant) Mauro Kaufmann, RN as Oncology Nurse Navigator Rockwell Germany, RN as Oncology Nurse Navigator Alphonsa Overall, MD as Consulting Physician (General Surgery) Nicholas Lose, MD as Consulting Physician (Hematology and Oncology) Gery Pray, MD as Consulting Physician (Radiation Oncology)  CHIEF COMPLAINTS/PURPOSE OF CONSULTATION: Newly diagnosed breast cancer  HISTORY OF PRESENTING ILLNESS:  Linda Spears 58 y.o. female is here because of recent diagnosis of invasive ductal carcinoma of the left breast. The cancer was detected on a diagnostic mammogram and [place] on 01/27/19 after it was palpable to the patient for 6 months. It showed a 3.0cm mass in the left breast at the 9 o'clock position 2cm from the nipple. There was no left axillary adenopathy. Biopsy on 02/02/19 revealed grade 3 invasive ductal carcinoma, HER2 negative, ER 100%, PR 80%, Ki67 20%. She presents to the clinic today for initial evaluation and discussion of treatment.   I reviewed her records extensively and collaborated the history with the patient.  SUMMARY OF ONCOLOGIC HISTORY: Oncology History  Malignant neoplasm of upper-inner quadrant of left breast in female, estrogen receptor positive ([place])  02/07/2019 Initial Diagnosis   Diagnostic mammogram following palpable lump in the left breast x6 months showed 3.0cm mass at the 9 oc'clock position 2cm from the nipple. Biopsy confirmed grade 3 IDC, HER2 negative by FISH, ER+ (100%), PR+ (80%), Ki67 20%.    02/09/2019 Cancer Staging   Staging form: Breast, AJCC 8th Edition - Clinical stage from 02/09/2019: Stage IIA (cT2, cN0, cM0, G3, ER+, PR+, HER2-) - Signed by Nicholas Lose, MD on 02/09/2019     MEDICAL HISTORY:  History reviewed. No pertinent past medical history.  SURGICAL HISTORY: History reviewed. No pertinent surgical history.  SOCIAL HISTORY:  Social History   Socioeconomic History  . Marital status: Legally Separated    Spouse name: Not on file  . Number of children: 3  . Years of education: Not on file  . Highest education level: Bachelor's degree (e.g., BA, AB, BS)  Occupational History  . Not on file  Social Needs  . Financial resource strain: Not on file  . Food insecurity    Worry: Not on file    Inability: Not on file  . Transportation needs    Medical: Yes    Non-medical: Yes  Tobacco Use  . Smoking status: Current Every Day Smoker    Types: Cigarettes  . Smokeless tobacco: Never Used  Substance and Sexual Activity  . Alcohol use: Yes    Comment: social  . Drug use: No  . Sexual activity: Not Currently  Lifestyle  . Physical activity    Days per week: Not on file    Minutes per session: Not on file  . Stress: Not on file  Relationships  . Social Herbalist on phone: Not on file    Gets together: Not on file    Attends religious service: Not on file    Active member of club or organization: Not on file    Attends meetings of clubs or organizations: Not on file    Relationship status: Not on file  . Intimate partner violence    Fear of current or ex partner: Not on file    Emotionally abused: Not on file    Physically abused: Not on file    Forced sexual activity: Not on file  Other Topics Concern  .  Not on file  Social History Narrative  . Not on file    FAMILY HISTORY: Family History  Problem Relation Age of Onset  . Hypertension Mother   . Migraines Mother   . Breast cancer Sister 61    ALLERGIES:  has No Known Allergies.  MEDICATIONS:  Current Outpatient Medications  Medication Sig Dispense Refill  . Multiple Vitamins-Minerals (MULTIVITAMIN WITH MINERALS) tablet Take 1 tablet by mouth as needed.     No current facility-administered medications for this visit.     REVIEW OF SYSTEMS:   Constitutional: Denies fevers, chills or abnormal night sweats Eyes: Denies  blurriness of vision, double vision or watery eyes Ears, nose, mouth, throat, and face: Denies mucositis or sore throat Respiratory: Denies cough, dyspnea or wheezes Cardiovascular: Denies palpitation, chest discomfort or lower extremity swelling Gastrointestinal:  Denies nausea, heartburn or change in bowel habits Skin: Denies abnormal skin rashes Lymphatics: Denies new lymphadenopathy or easy bruising Neurological:Denies numbness, tingling or new weaknesses Behavioral/Psych: Mood is stable, no new changes  Breast: Palpable lump in left breast All other systems were reviewed with the patient and are negative.  PHYSICAL EXAMINATION: ECOG PERFORMANCE STATUS: 1 - Symptomatic but completely ambulatory  Vitals:   02/09/19 0842  BP: (!) 170/90  Pulse: (!) 53  Resp: 18  Temp: 98 F (36.7 C)  SpO2: 100%   Filed Weights   02/09/19 0842  Weight: 116 lb (52.6 kg)    Exam not done due to COVID-19 precautions LABORATORY DATA:  I have reviewed the data as listed Lab Results  Component Value Date   WBC 6.3 02/09/2019   HGB 13.1 02/09/2019   HCT 42.0 02/09/2019   MCV 87.7 02/09/2019   PLT 254 02/09/2019   Lab Results  Component Value Date   NA 138 02/09/2019   K 4.1 02/09/2019   CL 104 02/09/2019   CO2 25 02/09/2019    RADIOGRAPHIC STUDIES: I have personally reviewed the radiological reports and agreed with the findings in the report.  ASSESSMENT AND PLAN:  Malignant neoplasm of upper-inner quadrant of left breast in female, estrogen receptor positive ([place]) 02/07/2019:Diagnostic mammogram following palpable lump in the left breast x6 months showed 3.0cm mass at the 9 oc'clock position 2cm from the nipple. Biopsy confirmed grade 3 IDC, HER2 negative by FISH, ER+ (100%), PR+ (80%), Ki67 20%. Stage 2A  Pathology and radiology counseling:Discussed with the patient, the details of pathology including the type of breast cancer,the clinical staging, the significance of ER, PR and  HER-2/neu receptors and the implications for treatment. After reviewing the pathology in detail, we proceeded to discuss the different treatment options between surgery, radiation, chemotherapy, antiestrogen therapies.  Recommendations: Breast MRI 1. Breast conserving surgery followed by 2. Oncotype DX testing to determine if chemotherapy would be of any benefit followed by 3. Adjuvant radiation therapy followed by 4. Adjuvant antiestrogen therapy  Oncotype counseling: I discussed Oncotype DX test. I explained to the patient that this is a 21 gene panel to evaluate patient tumors DNA to calculate recurrence score. This would help determine whether patient has high risk or intermediate risk or low risk breast cancer. She understands that if her tumor was found to be high risk, she would benefit from systemic chemotherapy. If low risk, no need of chemotherapy. If she was found to be intermediate risk, we would need to evaluate the score as well as other risk factors and determine if an abbreviated chemotherapy may be of benefit.  Return to clinic after  surgery to discuss final pathology report and then determine if Oncotype DX testing will need to be sent.     All questions were answered. The patient knows to call the clinic with any problems, questions or concerns.   Rulon Eisenmenger, MD 02/09/2019    I, Molly Dorshimer, am acting as scribe for Nicholas Lose, MD.  I have reviewed the above documentation for accuracy and completeness, and I agree with the above.

## 2019-02-08 NOTE — Progress Notes (Signed)
Radiation Oncology         [phone] ________________________________  Multidisciplinary Breast Oncology Clinic [facility]) Initial Outpatient Consultation  Name: Linda SILVESTRO MRN: [id number]  Date: 02/09/2019  DOB: 1960/12/29  KP:TWSFK, Lesli Albee, Benson Norway, MD   REFERRING PHYSICIAN: Alphonsa Overall, MD  DIAGNOSIS: The encounter diagnosis was Malignant neoplasm of upper-inner quadrant of left breast in female, estrogen receptor positive ([place]).  Stage IIA (cT2, cN0, cM0) Left Breast UIQ, Invasive Ductal Carcinoma, ER+ / PR+ / Her2-, Grade 3    ICD-10-CM   1. Malignant neoplasm of upper-inner quadrant of left breast in female, estrogen receptor positive ([place])  C50.212    Z17.0    Oncology History  Malignant neoplasm of upper-inner quadrant of left breast in female, estrogen receptor positive ([place])  02/07/2019 Initial Diagnosis   Diagnostic mammogram following palpable lump in the left breast x6 months showed 3.0cm mass at the 9 oc'clock position 2cm from the nipple. Biopsy confirmed grade 3 IDC, HER2 negative by FISH, ER+ (100%), PR+ (80%), Ki67 20%.    02/09/2019 Cancer Staging   Staging form: Breast, AJCC 8th Edition - Clinical stage from 02/09/2019: Stage IIA (cT2, cN0, cM0, G3, ER+, PR+, HER2-) - Signed by Nicholas Lose, MD on 02/09/2019    HISTORY OF PRESENT ILLNESS::Linda Spears is a 58 y.o. female who is presenting to the office today for evaluation of her newly diagnosed breast cancer.   She presented with a palpable left breast lump, which she first noticed around November 2019. She underwent bilateral diagnostic mammography with tomography and left breast ultrasonography at [facility] on 01/27/2019 showing: highly suspicious left breast mass at 9 o'clock corresponding with the patient's palpable lump; no suspicious left axillary lymphadenopathy; no mammographic evidence of malignancy on the right.  Biopsy on 02/02/2019 showed: invasive ductal carcinoma,  grade 3, with perineural invasion present. Prognostic indicators significant for: estrogen receptor, 100% positive with strong staining intensity and progesterone receptor, 80% positive with moderate staining intensity. Proliferation marker Ki67 at 20%. HER2 equivocal by immunohistochemistry, and negative by FISH.   The patient was referred today for presentation in the multidisciplinary conference.  Radiology studies and pathology slides were presented there for review and discussion of treatment options.  A consensus was discussed regarding potential next steps.  PREVIOUS RADIATION THERAPY: No  PAST MEDICAL HISTORY:  has no past medical history on file.    PAST SURGICAL HISTORY:No past surgical history on file.  FAMILY HISTORY: family history includes Breast cancer (age of onset: 74) in her sister; Hypertension in her mother; Migraines in her mother.  SOCIAL HISTORY:  reports that she has been smoking cigarettes. She has never used smokeless tobacco. She reports current alcohol use. She reports that she does not use drugs.  ALLERGIES: Patient has no known allergies.  MEDICATIONS:  Current Outpatient Medications  Medication Sig Dispense Refill   Multiple Vitamins-Minerals (MULTIVITAMIN WITH MINERALS) tablet Take 1 tablet by mouth as needed.     No current facility-administered medications for this encounter.     REVIEW OF SYSTEMS:  REVIEW OF SYSTEMS: A 10+ POINT REVIEW OF SYSTEMS WAS OBTAINED including neurology, dermatology, psychiatry, cardiac, respiratory, lymph, extremities, GI, GU, musculoskeletal, constitutional, reproductive, HEENT. On the provided form, she reports no nipple discharge or bleeding. She denies headaches or new bony pain and any other symptoms.    PHYSICAL EXAM:  Vitals with BMI 02/09/2019  Height 5' 2"   Weight 116 lbs  BMI 81.27  Systolic 517  Diastolic 90  Pulse 53  Respirations 18  Lungs are clear to auscultation bilaterally. Heart has regular rate and  rhythm. No palpable cervical, supraclavicular, or axillary adenopathy. Abdomen soft, non-tender, normal bowel sounds. Breast: right breast with no palpable mass, nipple discharge, or bleeding. left breast with a palpable mass in the upper inner aspect estimated to be approximately 3.5 x 3.0 cm portion of this palpable mass may be related to bruising from her recent biopsy,  no nipple discharge or bleeding.   ECOG = 1  0 - Asymptomatic (Fully active, able to carry on all predisease activities without restriction)  1 - Symptomatic but completely ambulatory (Restricted in physically strenuous activity but ambulatory and able to carry out work of a light or sedentary nature. For example, light housework, office work)  2 - Symptomatic, <50% in bed during the day (Ambulatory and capable of all self care but unable to carry out any work activities. Up and about more than 50% of waking hours)  3 - Symptomatic, >50% in bed, but not bedbound (Capable of only limited self-care, confined to bed or chair 50% or more of waking hours)  4 - Bedbound (Completely disabled. Cannot carry on any self-care. Totally confined to bed or chair)  5 - Death   Eustace Pen MM, Creech RH, Tormey DC, et al. [phone]). "Toxicity and response criteria of the [facility]". [place] Oncol. 5 (6): 649-55  LABORATORY DATA:  Lab Results  Component Value Date   WBC 6.3 02/09/2019   HGB 13.1 02/09/2019   HCT 42.0 02/09/2019   MCV 87.7 02/09/2019   PLT 254 02/09/2019   Lab Results  Component Value Date   NA 138 02/09/2019   K 4.1 02/09/2019   CL 104 02/09/2019   CO2 25 02/09/2019   Lab Results  Component Value Date   ALT 18 02/09/2019   AST 25 02/09/2019   ALKPHOS 79 02/09/2019   BILITOT 0.4 02/09/2019    PULMONARY FUNCTION TEST:   Recent Review Flowsheet Data    There is no flowsheet data to display.      RADIOGRAPHY: US Breast Ltd Uni Left Inc Axilla  Result Date: 01/27/2019 CLINICAL DATA:   58 year old female with a palpable left breast lump for approximately 6 months. EXAM: DIGITAL DIAGNOSTIC BILATERAL MAMMOGRAM WITH CAD AND TOMO ULTRASOUND RIGHT BREAST COMPARISON:  None. ACR Breast Density Category c: The breast tissue is heterogeneously dense, which may obscure small masses. FINDINGS: A radiopaque BB in the medial left breast is seen in association with a spiculated hyperdense mass. No additional suspicious findings are identified throughout the remainder of the left breast or within the right breast. Mammographic images were processed with CAD. Targeted ultrasound is performed, showing an irregular hypoechoic mass with associated vascularity at the 9 o'clock position 2 cm from the nipple. It measures 3.0 x 2.8 x 2.0 cm. Evaluation of the left axilla demonstrates no suspicious lymphadenopathy. IMPRESSION: 1. Highly suspicious left breast mass corresponding with the patient's palpable lump. Recommendation is for ultrasound-guided biopsy. 2. No suspicious left axillary lymphadenopathy. 3. No mammographic evidence of malignancy on the right. RECOMMENDATION: Ultrasound-guided biopsy of the left breast. I have discussed the findings and recommendations with the patient. Results were also provided in writing at the conclusion of the visit. If applicable, a reminder letter will be sent to the patient regarding the next appointment. BI-RADS CATEGORY  5: Highly suggestive of malignancy. Electronically Signed   By: Shayne Alken.D.  On: 01/27/2019 11:41   Ms Digital Diag Tomo Bilat  Result Date: 01/27/2019 CLINICAL DATA:  58 year old female with a palpable left breast lump for approximately 6 months. EXAM: DIGITAL DIAGNOSTIC BILATERAL MAMMOGRAM WITH CAD AND TOMO ULTRASOUND RIGHT BREAST COMPARISON:  None. ACR Breast Density Category c: The breast tissue is heterogeneously dense, which may obscure small masses. FINDINGS: A radiopaque BB in the medial left breast is seen in association with a spiculated  hyperdense mass. No additional suspicious findings are identified throughout the remainder of the left breast or within the right breast. Mammographic images were processed with CAD. Targeted ultrasound is performed, showing an irregular hypoechoic mass with associated vascularity at the 9 o'clock position 2 cm from the nipple. It measures 3.0 x 2.8 x 2.0 cm. Evaluation of the left axilla demonstrates no suspicious lymphadenopathy. IMPRESSION: 1. Highly suspicious left breast mass corresponding with the patient's palpable lump. Recommendation is for ultrasound-guided biopsy. 2. No suspicious left axillary lymphadenopathy. 3. No mammographic evidence of malignancy on the right. RECOMMENDATION: Ultrasound-guided biopsy of the left breast. I have discussed the findings and recommendations with the patient. Results were also provided in writing at the conclusion of the visit. If applicable, a reminder letter will be sent to the patient regarding the next appointment. BI-RADS CATEGORY  5: Highly suggestive of malignancy. Electronically Signed   By: Kristopher Oppenheim M.D.   On: 01/27/2019 11:41   Mm Clip Placement Left  Result Date: 02/02/2019 CLINICAL DATA:  Patient status post ultrasound-guided biopsy indeterminate left breast mass. EXAM: DIAGNOSTIC LEFT MAMMOGRAM POST ULTRASOUND BIOPSY COMPARISON:  Previous exam(s). FINDINGS: Mammographic images were obtained following ultrasound guided biopsy of left breast mass. Ribbon shaped marking clip in appropriate position. IMPRESSION: Appropriate position ribbon shaped marking clip status post ultrasound-guided biopsy left breast mass. Final Assessment: Post Procedure Mammograms for Marker Placement Electronically Signed   By: Lovey Newcomer M.D.   On: 02/02/2019 15:30   [place] Lt Breast Bx W Loc Dev 1st Lesion Img Bx Spec US Guide  Addendum Date: 02/04/2019   ADDENDUM REPORT: 02/03/2019 13:02 ADDENDUM: Pathology revealed GRADE III INVASIVE DUCTAL CARCINOMA, PERINEURAL INVASION  IS IDENTIFIED of the Left breast, 9 o'clock. This was found to be concordant by Dr. Lovey Newcomer. Pathology results were discussed with the patient by telephone. The patient reported doing well after the biopsy with tenderness at the site. Post biopsy instructions and care were reviewed and questions were answered. The patient was encouraged to call [facility] for any additional concerns. The patient was [facility] on February 09, 2019. Pathology results reported by Terie Purser, RN on 02/03/2019. Electronically Signed   By: Lovey Newcomer M.D.   On: 02/03/2019 13:02   Result Date: 02/04/2019 CLINICAL DATA:  Patient with indeterminate left breast mass. EXAM: ULTRASOUND GUIDED LEFT BREAST CORE NEEDLE BIOPSY COMPARISON:  Previous exam(s). FINDINGS: I met with the patient and we discussed the procedure of ultrasound-guided biopsy, including benefits and alternatives. We discussed the high likelihood of a successful procedure. We discussed the risks of the procedure, including infection, bleeding, tissue injury, clip migration, and inadequate sampling. Informed written consent was given. The usual time-out protocol was performed immediately prior to the procedure. Lesion quadrant: Lower inner quadrant Using sterile technique and 1% Lidocaine as local anesthetic, under direct ultrasound visualization, a 14 gauge spring-loaded device was used to perform biopsy of left breast mass 9 o'clock position using  a medial approach. At the conclusion of the procedure a ribbon shaped tissue marker clip was deployed into the biopsy cavity. Follow up 2 view mammogram was performed and dictated separately. IMPRESSION: Ultrasound guided biopsy of left breast mass. No apparent complications. Electronically Signed: By: Lovey Newcomer M.D. On: 02/02/2019 15:30      IMPRESSION: Stage IIA (cT2, cN0, cM0) Left Breast UOQ, Invasive  Ductal Carcinoma, ER+ / PR+ / Her2-, Grade 3   Patient will be a good candidate for breast conservation with radiotherapy to left breast. We discussed the general course of radiation, potential side effects, and toxicities with radiation and the patient is interested in this approach.  She will be seen for further evaluation after her definitive surgery.   PLAN: Breast MRI 1. Breast conserving surgery followed by 2. Oncotype DX testing to determine if chemotherapy would be of any benefit followed by 3. Adjuvant radiation therapy followed by 4. Adjuvant antiestrogen therapy  5.  Recommended smoking cessation with the patient continued her on her own at this time. 6.  Consult with her primary care physician if her blood pressure continues to be elevated ------------------------------------------------  Blair Promise, PhD, MD  This document serves as a record of services personally performed by Gery Pray, MD. It was created on his behalf by Wilburn Mylar, a trained medical scribe. The creation of this record is based on the scribe's personal observations and the provider's statements to them. This document has been checked and approved by the attending provider.

## 2019-02-09 ENCOUNTER — Ambulatory Visit
Admission: RE | Admit: 2019-02-09 | Discharge: 2019-02-09 | Disposition: A | Discharge status: Home / Self Care | Payer: No Typology Code available for payment source | Source: Ambulatory Visit | Attending: Radiation Oncology | Admitting: Radiation Oncology

## 2019-02-09 ENCOUNTER — Inpatient Hospital Stay: Payer: Medicaid Other | Attending: Hematology and Oncology | Admitting: Hematology and Oncology

## 2019-02-09 ENCOUNTER — Encounter: Payer: Self-pay | Admitting: Hematology and Oncology

## 2019-02-09 ENCOUNTER — Other Ambulatory Visit: Payer: Self-pay | Admitting: *Deleted

## 2019-02-09 ENCOUNTER — Inpatient Hospital Stay: Payer: Medicaid Other

## 2019-02-09 ENCOUNTER — Other Ambulatory Visit: Payer: Self-pay

## 2019-02-09 DIAGNOSIS — Z803 Family history of malignant neoplasm of breast: Secondary | ICD-10-CM

## 2019-02-09 DIAGNOSIS — C50212 Malignant neoplasm of upper-inner quadrant of left female breast: Secondary | ICD-10-CM

## 2019-02-09 DIAGNOSIS — Z720 Tobacco use: Secondary | ICD-10-CM | POA: Diagnosis not present

## 2019-02-09 DIAGNOSIS — Z170 Estrogen receptor positive status [ER+]: Secondary | ICD-10-CM | POA: Diagnosis not present

## 2019-02-09 LAB — CMP (CANCER CENTER ONLY)
ALT: 18 U/L (ref 0–44)
AST: 25 U/L (ref 15–41)
Albumin: 4.2 g/dL (ref 3.5–5.0)
Alkaline Phosphatase: 79 U/L (ref 38–126)
Anion gap: 9 (ref 5–15)
BUN: 12 mg/dL (ref 6–20)
CO2: 25 mmol/L (ref 22–32)
Calcium: 9.4 mg/dL (ref 8.9–10.3)
Chloride: 104 mmol/L (ref 98–111)
Creatinine: 0.94 mg/dL (ref 0.44–1.00)
GFR, Est AFR Am: >60 mL/min (ref >60)
GFR, Estimated: >60 mL/min (ref >60)
Glucose, Bld: 119 mg/dL — ABNORMAL HIGH (ref 70–99)
Potassium: 4.1 mmol/L (ref 3.5–5.1)
Sodium: 138 mmol/L (ref 135–145)
Total Bilirubin: 0.4 mg/dL (ref 0.3–1.2)
Total Protein: 7.9 g/dL (ref 6.5–8.1)

## 2019-02-09 LAB — CBC WITH DIFFERENTIAL (CANCER CENTER ONLY)
Abs Immature Granulocytes: 0.02 10*3/uL (ref 0.00–0.07)
Basophils Absolute: 0.1 10*3/uL (ref 0.0–0.1)
Basophils Relative: 1 %
Eosinophils Absolute: 0.2 10*3/uL (ref 0.0–0.5)
Eosinophils Relative: 3 %
HCT: 42 % (ref 36.0–46.0)
Hemoglobin: 13.1 g/dL (ref 12.0–15.0)
Immature Granulocytes: 0 %
Lymphocytes Relative: 37 %
Lymphs Abs: 2.3 10*3/uL (ref 0.7–4.0)
MCH: 27.3 pg (ref 26.0–34.0)
MCHC: 31.2 g/dL (ref 30.0–36.0)
MCV: 87.7 fL (ref 80.0–100.0)
Monocytes Absolute: 0.7 10*3/uL (ref 0.1–1.0)
Monocytes Relative: 11 %
Neutro Abs: 3.1 10*3/uL (ref 1.7–7.7)
Neutrophils Relative %: 48 %
Platelet Count: 254 10*3/uL (ref 150–400)
RBC: 4.79 MIL/uL (ref 3.87–5.11)
RDW: 14.2 % (ref 11.5–15.5)
WBC Count: 6.3 10*3/uL (ref 4.0–10.5)
nRBC: 0 % (ref 0.0–0.2)

## 2019-02-09 NOTE — Progress Notes (Signed)
Mri ber

## 2019-02-09 NOTE — Assessment & Plan Note (Addendum)
02/07/2019:Diagnostic mammogram following palpable lump in the left breast x6 months showed 3.0cm mass at the 9 oc'clock position 2cm from the nipple. Biopsy confirmed grade 3 IDC, HER2 negative by FISH, ER+ (100%), PR+ (80%), Ki67 20%. Stage 2A  Pathology and radiology counseling:Discussed with the patient, the details of pathology including the type of breast cancer,the clinical staging, the significance of ER, PR and HER-2/neu receptors and the implications for treatment. After reviewing the pathology in detail, we proceeded to discuss the different treatment options between surgery, radiation, chemotherapy, antiestrogen therapies.  Recommendations: Breast MRI 1. Breast conserving surgery followed by 2. Oncotype DX testing to determine if chemotherapy would be of any benefit followed by 3. Adjuvant radiation therapy followed by 4. Adjuvant antiestrogen therapy  Oncotype counseling: I discussed Oncotype DX test. I explained to the patient that this is a 21 gene panel to evaluate patient tumors DNA to calculate recurrence score. This would help determine whether patient has high risk or intermediate risk or low risk breast cancer. She understands that if her tumor was found to be high risk, she would benefit from systemic chemotherapy. If low risk, no need of chemotherapy. If she was found to be intermediate risk, we would need to evaluate the score as well as other risk factors and determine if an abbreviated chemotherapy may be of benefit.  Return to clinic after surgery to discuss final pathology report and then determine if Oncotype DX testing will need to be sent.

## 2019-02-16 ENCOUNTER — Telehealth: Payer: Self-pay

## 2019-02-16 ENCOUNTER — Ambulatory Visit (HOSPITAL_COMMUNITY)
Admission: RE | Admit: 2019-02-16 | Discharge: 2019-02-16 | Disposition: A | Discharge status: Home / Self Care | Payer: Medicaid Other | Source: Ambulatory Visit | Attending: Surgery | Admitting: Surgery

## 2019-02-16 ENCOUNTER — Other Ambulatory Visit: Payer: Self-pay

## 2019-02-16 DIAGNOSIS — Z170 Estrogen receptor positive status [ER+]: Secondary | ICD-10-CM | POA: Diagnosis present

## 2019-02-16 DIAGNOSIS — C50212 Malignant neoplasm of upper-inner quadrant of left female breast: Secondary | ICD-10-CM | POA: Diagnosis not present

## 2019-02-16 MED ORDER — GADOBUTROL 1 MMOL/ML IV SOLN
5.0000 mL | Freq: Once | INTRAVENOUS | Status: AC | PRN
  Administered 2019-02-16: 5 mL via INTRAVENOUS

## 2019-02-16 NOTE — Telephone Encounter (Signed)
Nutrition  Patient with new diagnosis of breast cancer.  Patient attended [facility] on 6/17 and received nutrition packet.    Chart reviewed.  Called patient to introduce self and service at [facility].  Patient reports that she eats well and likes to cook.  No concerns or questions regarding nutrition at this time.    Lakeyta Vandenheuvel B. Zenia Resides, [place], [place] Registered Dietitian [phone] (pager)

## 2019-02-17 ENCOUNTER — Telehealth: Payer: Self-pay | Admitting: *Deleted

## 2019-02-17 NOTE — Telephone Encounter (Signed)
  Oncology Nurse Navigator Documentation  Navigator Location: [facility] (02/17/19 1600)   )Navigator Encounter Type: [place] Follow-up;Telephone (02/17/19 1600) Telephone: Clinic/[facility] Follow-up;Outgoing Call (02/17/19 1600)                                                  Time Spent with Patient: 15 (02/17/19 1600)

## 2019-02-22 ENCOUNTER — Other Ambulatory Visit: Payer: Self-pay | Admitting: Surgery

## 2019-02-22 DIAGNOSIS — R9389 Abnormal findings on diagnostic imaging of other specified body structures: Secondary | ICD-10-CM

## 2019-02-28 ENCOUNTER — Encounter: Payer: Self-pay | Admitting: *Deleted

## 2019-02-28 NOTE — Progress Notes (Signed)
Clinical Social Work [place] Psychosocial Distress Screening [place]  Patient completed distress screening protocol and scored a 4 on the Psychosocial Distress Thermometer which indicates mild distress. Clinical Social Worker contacted patietn at home after she attended [facility] to assess for distress and other psychosocial needs. Patient stated she was feeling overwhelmed but felt "better" after meeting with the treatment team and getting more information on her treatment plan. CSW and patient discussed common feeling and emotions when being diagnosed with cancer, and the importance of support during treatment. CSW informed patient of the support team and support services at [facility], and mailed packet with included information. CSW provided contact information and encouraged patient to call with any questions or concerns.  ONCBCN DISTRESS SCREENING 02/28/2019  Screening Type Initial Screening  Distress experienced in past week (1-10) 4  Practical problem type Work/school  Information Concerns Type Lack of info about diagnosis;Lack of info about treatment;Lack of info about complementary therapy choices  Physical Problem type Loss of appetitie  Physician notified of physical symptoms Yes     Johnnye Lana, MSW, LCSW, OSW-C Clinical Social Worker [place] [phone]

## 2019-03-04 ENCOUNTER — Ambulatory Visit
Admission: RE | Admit: 2019-03-04 | Discharge: 2019-03-04 | Disposition: A | Discharge status: Home / Self Care | Payer: Self-pay | Source: Ambulatory Visit | Attending: Surgery | Admitting: Surgery

## 2019-03-04 ENCOUNTER — Other Ambulatory Visit: Payer: Self-pay

## 2019-03-04 ENCOUNTER — Ambulatory Visit
Admission: RE | Admit: 2019-03-04 | Discharge: 2019-03-04 | Disposition: A | Discharge status: Home / Self Care | Payer: No Typology Code available for payment source | Source: Ambulatory Visit | Attending: Surgery | Admitting: Surgery

## 2019-03-04 DIAGNOSIS — R9389 Abnormal findings on diagnostic imaging of other specified body structures: Secondary | ICD-10-CM

## 2019-03-04 MED ORDER — GADOBUTROL 1 MMOL/ML IV SOLN
5.0000 mL | Freq: Once | INTRAVENOUS | Status: AC | PRN
  Administered 2019-03-04: 5 mL via INTRAVENOUS

## 2019-03-08 ENCOUNTER — Other Ambulatory Visit (HOSPITAL_COMMUNITY): Payer: Self-pay | Admitting: Surgery

## 2019-03-08 DIAGNOSIS — N6320 Unspecified lump in the left breast, unspecified quadrant: Secondary | ICD-10-CM

## 2019-03-08 DIAGNOSIS — C50912 Malignant neoplasm of unspecified site of left female breast: Secondary | ICD-10-CM

## 2019-03-09 ENCOUNTER — Other Ambulatory Visit (HOSPITAL_COMMUNITY): Payer: Self-pay | Admitting: Surgery

## 2019-03-09 DIAGNOSIS — C50912 Malignant neoplasm of unspecified site of left female breast: Secondary | ICD-10-CM

## 2019-03-09 DIAGNOSIS — N6320 Unspecified lump in the left breast, unspecified quadrant: Secondary | ICD-10-CM

## 2019-03-10 ENCOUNTER — Telehealth: Payer: Self-pay | Admitting: Hematology and Oncology

## 2019-03-10 NOTE — Telephone Encounter (Signed)
Scheduled appt per 7/15 sch message - pt aware of appt date and time   

## 2019-03-11 ENCOUNTER — Ambulatory Visit
Admission: RE | Admit: 2019-03-11 | Discharge: 2019-03-11 | Disposition: A | Discharge status: Home / Self Care | Payer: No Typology Code available for payment source | Source: Ambulatory Visit | Attending: Surgery | Admitting: Surgery

## 2019-03-15 ENCOUNTER — Encounter: Payer: Self-pay | Admitting: *Deleted

## 2019-03-16 NOTE — Progress Notes (Signed)
[facility] [place], [address] AT [address] [place] [id number] Phone: [phone] Fax: [phone]      Your procedure is scheduled on July 28th.  Report to [facility] Main Entrance "A" at 5:30 A.M., and check in at the Admitting office.  Call this number if you have problems the morning of surgery:  [phone]  Call [phone] if you have any questions prior to your surgery date Monday-Friday 8am-4pm    Remember:  Do not eat after midnight the night before your surgery  You may drink clear liquids until 4:30 AM the morning of your surgery.   Clear liquids allowed are: Water, Non-Citrus Juices (without pulp), Carbonated Beverages, Clear Tea, Black Coffee Only, and Gatorade    Take these medicines the morning of surgery with A SIP OF WATER - NONE  7 days prior to surgery STOP taking any Aspirin (unless otherwise instructed by your surgeon), Aleve, Naproxen, Ibuprofen, Motrin, Advil, Goody's, BC's, all herbal medications, fish oil, and all vitamins.    The Morning of Surgery  Do not wear jewelry, make-up or nail polish.  Do not wear lotions, powders, or perfumes, or deodorant  Do not shave 48 hours prior to surgery.   Do not bring valuables to the hospital.  [facility] is not responsible for any belongings or valuables.  If you are a smoker, DO NOT Smoke 24 hours prior to surgery IF you wear a CPAP at night please bring your mask, tubing, and machine the morning of surgery   Remember that you must have someone to transport you home after your surgery, and remain with you for 24 hours if you are discharged the same day.   Contacts, glasses, hearing aids, dentures or bridgework may not be worn into surgery.    Leave your suitcase in the car.  After surgery it may be brought to your room.  For patients admitted to the hospital, discharge time will be determined by your treatment  team.  Patients discharged the day of surgery will not be allowed to drive home.    Special instructions:   - Preparing For Surgery  Before surgery, you can play an important role. Because skin is not sterile, your skin needs to be as free of germs as possible. You can reduce the number of germs on your skin by washing with CHG (chlorahexidine gluconate) Soap before surgery.  CHG is an antiseptic cleaner which kills germs and bonds with the skin to continue killing germs even after washing.    Oral Hygiene is also important to reduce your risk of infection.  Remember - BRUSH YOUR TEETH THE MORNING OF SURGERY WITH YOUR REGULAR TOOTHPASTE  Please do not use if you have an allergy to CHG or antibacterial soaps. If your skin becomes reddened/irritated stop using the CHG.  Do not shave (including legs and underarms) for at least 48 hours prior to first CHG shower. It is OK to shave your face.  Please follow these instructions carefully.   1. Shower the NIGHT BEFORE SURGERY and the MORNING OF SURGERY with CHG Soap.   2. If you chose to wash your hair, wash your hair first as usual with your normal shampoo.  3. After you shampoo, rinse your hair and body thoroughly to remove the shampoo.  4. Use CHG as you would any other liquid soap. You can apply CHG directly to the skin and  wash gently with a scrungie or a clean washcloth.   5. Apply the CHG Soap to your body ONLY FROM THE NECK DOWN.  Do not use on open wounds or open sores. Avoid contact with your eyes, ears, mouth and genitals (private parts). Wash Face and genitals (private parts)  with your normal soap.   6. Wash thoroughly, paying special attention to the area where your surgery will be performed.  7. Thoroughly rinse your body with warm water from the neck down.  8. DO NOT shower/wash with your normal soap after using and rinsing off the CHG Soap.  9. Pat yourself dry with a CLEAN TOWEL.  10. Wear CLEAN PAJAMAS to bed  the night before surgery, wear comfortable clothes the morning of surgery  11. Place CLEAN SHEETS on your bed the night of your first shower and DO NOT SLEEP WITH PETS.   Day of Surgery:  Do not apply any deodorants/lotions. Please shower the morning of surgery with the CHG soap  Please wear clean clothes to the hospital/surgery center.   Remember to brush your teeth WITH YOUR REGULAR TOOTHPASTE.   Please read over the following fact sheets that you were given.

## 2019-03-17 ENCOUNTER — Encounter (HOSPITAL_COMMUNITY): Payer: Self-pay

## 2019-03-17 ENCOUNTER — Other Ambulatory Visit: Payer: Self-pay

## 2019-03-17 ENCOUNTER — Encounter: Payer: Self-pay | Admitting: Hematology and Oncology

## 2019-03-17 ENCOUNTER — Encounter (HOSPITAL_COMMUNITY)
Admission: RE | Admit: 2019-03-17 | Discharge: 2019-03-17 | Disposition: A | Discharge status: Home / Self Care | Payer: Medicaid Other | Source: Ambulatory Visit | Attending: Surgery | Admitting: Surgery

## 2019-03-17 DIAGNOSIS — Z01812 Encounter for preprocedural laboratory examination: Secondary | ICD-10-CM | POA: Diagnosis not present

## 2019-03-17 HISTORY — DX: Malignant (primary) neoplasm, unspecified: C80.1

## 2019-03-17 LAB — BASIC METABOLIC PANEL
Anion gap: 9 (ref 5–15)
BUN: 12 mg/dL (ref 6–20)
CO2: 24 mmol/L (ref 22–32)
Calcium: 9 mg/dL (ref 8.9–10.3)
Chloride: 103 mmol/L (ref 98–111)
Creatinine, Ser: 0.76 mg/dL (ref 0.44–1.00)
GFR calc Af Amer: >60 mL/min (ref >60)
GFR calc non Af Amer: >60 mL/min (ref >60)
Glucose, Bld: 83 mg/dL (ref 70–99)
Potassium: 3.9 mmol/L (ref 3.5–5.1)
Sodium: 136 mmol/L (ref 135–145)

## 2019-03-17 LAB — CBC
HCT: 41 % (ref 36.0–46.0)
Hemoglobin: 12.7 g/dL (ref 12.0–15.0)
MCH: 27.5 pg (ref 26.0–34.0)
MCHC: 31 g/dL (ref 30.0–36.0)
MCV: 88.7 fL (ref 80.0–100.0)
Platelets: 282 10*3/uL (ref 150–400)
RBC: 4.62 MIL/uL (ref 3.87–5.11)
RDW: 14.1 % (ref 11.5–15.5)
WBC: 6.2 10*3/uL (ref 4.0–10.5)
nRBC: 0 % (ref 0.0–0.2)

## 2019-03-17 NOTE — Progress Notes (Signed)
Patients initial PAT BP 185/78. Rechecked, BP 162/80 (manually) Patient stated BP runs high and PCP is aware and currently monitoring. Not on any meds.

## 2019-03-17 NOTE — Anesthesia Preprocedure Evaluation (Addendum)
Anesthesia Evaluation  Patient identified by MRN, date of birth, ID band Patient awake    Reviewed: Allergy & Precautions, NPO status , Patient's Chart, lab work & pertinent test results  History of Anesthesia Complications Negative for: history of anesthetic complications  Airway Mallampati: II  TM Distance: >3 FB Neck ROM: Full    Dental no notable dental hx. (+) Dental Advisory Given   Pulmonary Current Smoker,    Pulmonary exam normal        Cardiovascular negative cardio ROS Normal cardiovascular exam     Neuro/Psych negative neurological ROS     GI/Hepatic negative GI ROS, Neg liver ROS,   Endo/Other  negative endocrine ROS  Renal/GU negative Renal ROS     Musculoskeletal negative musculoskeletal ROS (+)   Abdominal   Peds  Hematology negative hematology ROS (+)   Anesthesia Other Findings Day of surgery medications reviewed with the patient.  Reproductive/Obstetrics                           Anesthesia Physical Anesthesia Plan  ASA: II  Anesthesia Plan: General   Post-op Pain Management:  Regional for Post-op pain   Induction: Intravenous  PONV Risk Score and Plan: 3 and Ondansetron, Dexamethasone and Scopolamine patch - Pre-op  Airway Management Planned: LMA  Additional Equipment:   Intra-op Plan:   Post-operative Plan: Extubation in OR  Informed Consent: I have reviewed the patients History and Physical, chart, labs and discussed the procedure including the risks, benefits and alternatives for the proposed anesthesia with the patient or authorized representative who has indicated his/her understanding and acceptance.     Dental advisory given  Plan Discussed with: CRNA and Anesthesiologist  Anesthesia Plan Comments: (Elevated BP noted at PAT. No diagnosis of HTN. Pt reports PCP is monitoring, has not started any antiHTN meds at this point. Advised continued  followup with PCP regarding this. )      Anesthesia Quick Evaluation

## 2019-03-17 NOTE — Progress Notes (Signed)
PCP - Clent Demark PA-C Cardiologist - denies  Chest x-ray - denies EKG - denies Stress Test -denies  ECHO - denies Cardiac Cath - denies  Sleep Study - denies CPAP - N/A  Blood Thinner Instructions: N/A Aspirin Instructions: N/A  Anesthesia review: No  Coronavirus Screening  Have you experienced the following symptoms:  Cough yes/no: No Fever (>100.23F)  yes/no: No Runny nose yes/no: No Sore throat yes/no: No Difficulty breathing/shortness of breath  yes/no: No  Have you or a family member traveled in the last 14 days and where? yes/no: No  If the patient indicates "YES" to the above questions, their PAT will be rescheduled to limit the exposure to others and, the surgeon will be notified. THE PATIENT WILL NEED TO BE ASYMPTOMATIC FOR 14 DAYS.   If the patient is not experiencing any of these symptoms, the PAT nurse will instruct them to NOT bring anyone with them to their appointment since they may have these symptoms or traveled as well.   Please remind your patients and families that hospital visitation restrictions are in effect and the importance of the restrictions.   Patient denies shortness of breath, fever, cough and chest pain at PAT appointment  Patient verbalized understanding of instructions that were given to them at the PAT appointment. Patient was also instructed that they will need to review over the PAT instructions again at home before surgery.

## 2019-03-18 ENCOUNTER — Other Ambulatory Visit (HOSPITAL_COMMUNITY)
Admission: RE | Admit: 2019-03-18 | Discharge: 2019-03-18 | Disposition: A | Discharge status: Home / Self Care | Payer: Medicaid Other | Source: Ambulatory Visit | Attending: Surgery | Admitting: Surgery

## 2019-03-18 DIAGNOSIS — Z1159 Encounter for screening for other viral diseases: Secondary | ICD-10-CM | POA: Insufficient documentation

## 2019-03-19 LAB — SARS CORONAVIRUS 2 (TAT 6-24 HRS): SARS Coronavirus 2: NEGATIVE

## 2019-03-21 ENCOUNTER — Other Ambulatory Visit: Payer: Self-pay

## 2019-03-21 ENCOUNTER — Ambulatory Visit
Admission: RE | Admit: 2019-03-21 | Discharge: 2019-03-21 | Disposition: A | Discharge status: Home / Self Care | Payer: No Typology Code available for payment source | Source: Ambulatory Visit | Attending: Surgery | Admitting: Surgery

## 2019-03-21 DIAGNOSIS — N6320 Unspecified lump in the left breast, unspecified quadrant: Secondary | ICD-10-CM

## 2019-03-21 DIAGNOSIS — C50912 Malignant neoplasm of unspecified site of left female breast: Secondary | ICD-10-CM

## 2019-03-21 NOTE — H&P (Signed)
Linda Spears  Location: [facility] Patient #: [id number] DOB: [birth year] Undefined / Language: Undefined / Race: Black or African American Female  History of Present Illness   The patient is a 58 year old female who presents with a complaint of breast cancer.  The PCP is Dr. Norma Fredrickson (? she is unsure of the name, he is at [facility], [facility] of [facility])  The patient was referred by Dr. Lovey Newcomer  The pateint is at the [facility] - Oncology is Drs. Lindi Adie and Kinard She is by herself. She did not want to try to call anyone on the phone.  [The Covid-19 virus has disrupted normal medical care in [place] and across the nation. We have sometimes had to alter normal surgical/medical care to limit this epidemic and we have explained these changes to the patient.]  She had never had a mammogram before. She noticed a mass in her left breast around November, 2019. She got sick in December, 2019, which knocked her out for several days. She then tried to get a mammogram, but the Coronavirus shut down happened. So now she is here.  [facility] - 1. Highly suspicious left breast mass corresponding with the patient's palpable lump. At 9 o'clock and measures 3.0 x 2.8 x 2.0 cm 2. No suspicious left axillary lymphadenopathy. 3. No mammographic evidence of malignancy on the right. Breast density "c" Biopsy: Left breast biopsy at 9 o'clock on 02/02/2019 - [id number] - IDC, grade 3, ER - 100%, PR - 80%, Ki67 - 20%, Her2Neu - negative Family history of breast or ovarian cancer: No On hormone therapy: No  I discussed the options for breast cancer treatment with the patient. The patient is at the [facility], which includes medical oncology and radiation oncology. I discussed the surgical options of lumpectomy vs. mastectomy. If mastectomy, there is the  possibility of reconstruction. I discussed the options of lymph node biopsy. The treatment plan depends on the pathologic staging of the tumor and the patient's personal wishes. The risks of surgery include, but are not limited to, bleeding, infection, the need for further surgery, and nerve injury. The patient has been given literature on the treatment of breast cancer.  Plan: 1) Breasts MRI, 2) Left breast lumpectomy (seed localization) and left axillary SLNBx, 3) Oncotype, 4) rad Tx, 5) Antihormone med  Past Medical History: 1. Smokes, trying to quit 2. She is following her BP  Social History: Unmarried. 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 So I am going to keep her out of work until the surgery is complete. She has a 53 yo son, 55 yo daughter, 14 yo son. She moved here from [place] about 2012.   Past Surgical History Tawni Pummel, RN; 02/09/2019 7:12 AM) Breast Biopsy  Left. Cesarean Section - Multiple  Oral Surgery  Tonsillectomy   Diagnostic Studies History Tawni Pummel, RN; 02/09/2019 7:12 AM) Colonoscopy  never Mammogram  within last year Pap Smear  1-5 years ago  Medication History Tawni Pummel, RN; 02/09/2019 7:12 AM) Medications Reconciled  Social History Tawni Pummel, RN; 02/09/2019 7:12 AM) Alcohol use  Occasional alcohol use. Caffeine use  Carbonated beverages, Coffee. No drug use  Tobacco use  Current every day smoker.  Family History Tawni Pummel, RN; 02/09/2019 7:12 AM) Bleeding disorder  Mother, Sister. Breast Cancer  Sister. Hypertension  Mother. Migraine Headache  Mother.  Pregnancy / Birth History Tawni Pummel, RN; 02/09/2019 7:12 AM) Age at menarche  29 years. Age of menopause  51-55 Gravida  3 Irregular periods  Maternal age  75-25 Para  3  Review of Systems Tawni Pummel RN; 02/09/2019 7:12  AM) General Not Present- Appetite Loss, Chills, Fatigue, Fever, Night Sweats, Weight Gain and Weight Loss. Skin Not Present- Change in Wart/Mole, Dryness, Hives, Jaundice, New Lesions, Non-Healing Wounds, Rash and Ulcer. HEENT Not Present- Earache, Hearing Loss, Hoarseness, Nose Bleed, Oral Ulcers, Ringing in the Ears, Seasonal Allergies, Sinus Pain, Sore Throat, Visual Disturbances, Wears glasses/contact lenses and Yellow Eyes. Respiratory Not Present- Bloody sputum, Chronic Cough, Difficulty Breathing, Snoring and Wheezing. Breast Not Present- Breast Mass, Breast Pain, Nipple Discharge and Skin Changes. Cardiovascular Not Present- Chest Pain, Difficulty Breathing Lying Down, Leg Cramps, Palpitations, Rapid Heart Rate, Shortness of Breath and Swelling of Extremities. Gastrointestinal Not Present- Abdominal Pain, Bloating, Bloody Stool, Change in Bowel Habits, Chronic diarrhea, Constipation, Difficulty Swallowing, Excessive gas, Gets full quickly at meals, Hemorrhoids, Indigestion, Nausea, Rectal Pain and Vomiting. Female Genitourinary Not Present- Frequency, Nocturia, Painful Urination, Pelvic Pain and Urgency. Musculoskeletal Not Present- Back Pain, Joint Pain, Joint Stiffness, Muscle Pain, Muscle Weakness and Swelling of Extremities. Neurological Not Present- Decreased Memory, Fainting, Headaches, Numbness, Seizures, Tingling, Tremor, Trouble walking and Weakness. Psychiatric Not Present- Anxiety, Bipolar, Change in Sleep Pattern, Depression, Fearful and Frequent crying. Endocrine Not Present- Cold Intolerance, Excessive Hunger, Hair Changes, Heat Intolerance, Hot flashes and New Diabetes. Hematology Not Present- Blood Thinners, Easy Bruising, Excessive bleeding, Gland problems, HIV and Persistent Infections.   Physical Exam  General: thin older AA F who is alert and generally healthy appearing. Skin: Inspection and palpation of the skin unremarkable.  Eyes: Conjunctivae white, pupils  equal. Face, ears, nose, mouth, and throat: Face - normal. Normal ears and nose. Lips and teeth normal.  Neck: Supple. No mass. Trachea midline. No thyroid mass. Lymph Nodes: No supraclavicular or cervical adenopathy. No axillary adenopathy.  Lungs: Normal respiratory effort. Clear to auscultation and symmetric breath sounds. Cardiovascular: Regular rate and rythm. Normal auscultation of the heart. No murmur or rub.  Breasts: Right - small breast, no mass  Left - small breast. she has a bruise and mass (about 3.0 cm) at the 9 o'clock position of the left breast.  Abdomen: Soft. No mass. Liver and spleen not palpable. No tenderness. No hernia. Normal bowel sounds. No abdominal scars. Rectal: Not done.  Musculoskeletal/extremities: Normal gait. Good strength and ROM in upper and lower extremities. Digits and nails are unremarkable.  Neurologic: Grossly intact to motor and sensory function. Psychiatric: Has normal mood and affect. Judgement and insight appear normal.   Assessment & Plan  1.  BREAST CANCER, STAGE 2, LEFT (C50.912) Story: Biopsy: Left breast biopsy at 9 o'clock on 02/02/2019 - [id number] - IDC, grade 3, ER - 100%, PR - 80%, Ki67 - 20%, Her2Neu - negative  Oncology - [place] [facility] [place]:   1) Breasts MRI - see below   2) Left breast lumpectomy x 2 (2 seed localizations) and left axillary SLNBx   3) Oncotype,   4) rad Tx,   5) Antihormone med  Addendum Note(Ethridge Sollenberger H. Cesario Weidinger MD; 02/17/2019 3:00 PM)  Her MRI on 02/16/2019 showed:  1. Dominant biopsy-proven invasive carcinoma within the inner LEFT breast, involving the lower inner quadrant and upper inner quadrant,with associated biopsy clip, the main/central component measuring 2.9 x 2.6 x 3.2 cm, but contiguous linear non-mass enhancement  extends from the anterior margin of the mass across the midline to the lower outer quadrant increasing the AP dimension to 3.8 cm. Additional contiguous linear non-mass  enhancement extends posteriorly from the mass in the upper inner quadrant to posterior depth, measuring an additional 1.6 cm extent, increasing the overall dimension to approximately 5 cm.  2. The biopsy-proven invasive carcinoma within the inner LEFT breast abuts the overlying skin, and abnormal enhancement of the skin indicates associated skin involvement.  3. Additional small mass within the upper inner quadrant of the LEFT breast, far superior, at posterior depth, measuring 5 mm, suspicious for additional multicentric disease.  4. No evidence of malignancy within the RIGHT breast.  5. No evidence of metastatic lymphadenopathy.  Addendum Note(Jora Galluzzo H. [name] MD; 03/08/2019 2:21 PM)  Left MRI guided biopsy 7/10 - Pathology revealed MUSCLE AND ADIPOSE TISSUE, BENIGN- of the LEFT breast, upper inner posterior (cylinder clip). This was found to be discordant by Dr. Curlene Dolphin, with excision recommended.  Pathology revealed FIBROCYSTIC CHANGES, BENIGN- of the LEFT breast, retroareolar (dumbbell clip). This was found to be concordant by Dr. Curlene Dolphin. ------------------------  So she'll need two lumpectomies on the left (2 seed loc) and left axillary SLNBx  I discussed this with the patient  2.  Smokes  Alphonsa Overall, MD, [facility] Pager: [phone] Office phone:  [phone]

## 2019-03-22 ENCOUNTER — Ambulatory Visit
Admission: RE | Admit: 2019-03-22 | Discharge: 2019-03-22 | Disposition: A | Discharge status: Home / Self Care | Payer: No Typology Code available for payment source | Source: Ambulatory Visit | Attending: Surgery | Admitting: Surgery

## 2019-03-22 ENCOUNTER — Ambulatory Visit (HOSPITAL_COMMUNITY): Payer: Medicaid Other | Admitting: Anesthesiology

## 2019-03-22 ENCOUNTER — Ambulatory Visit (HOSPITAL_COMMUNITY)
Admission: RE | Admit: 2019-03-22 | Discharge: 2019-03-22 | Disposition: A | Discharge status: Home / Self Care | Payer: Medicaid Other | Source: Ambulatory Visit | Attending: Surgery | Admitting: Surgery

## 2019-03-22 ENCOUNTER — Other Ambulatory Visit: Payer: Self-pay

## 2019-03-22 ENCOUNTER — Encounter (HOSPITAL_COMMUNITY)
Admission: RE | Disposition: A | Discharge status: Home / Self Care | Payer: Self-pay | Source: Home / Self Care | Attending: Surgery

## 2019-03-22 ENCOUNTER — Ambulatory Visit (HOSPITAL_COMMUNITY): Payer: Medicaid Other | Admitting: Physician Assistant

## 2019-03-22 ENCOUNTER — Ambulatory Visit (HOSPITAL_COMMUNITY)
Admission: RE | Admit: 2019-03-22 | Discharge: 2019-03-22 | Disposition: A | Discharge status: Home / Self Care | Payer: Medicaid Other | Attending: Surgery | Admitting: Surgery

## 2019-03-22 ENCOUNTER — Encounter (HOSPITAL_COMMUNITY): Payer: Self-pay | Admitting: General Practice

## 2019-03-22 DIAGNOSIS — C50812 Malignant neoplasm of overlapping sites of left female breast: Secondary | ICD-10-CM | POA: Insufficient documentation

## 2019-03-22 DIAGNOSIS — C773 Secondary and unspecified malignant neoplasm of axilla and upper limb lymph nodes: Secondary | ICD-10-CM | POA: Diagnosis not present

## 2019-03-22 DIAGNOSIS — N6489 Other specified disorders of breast: Secondary | ICD-10-CM | POA: Diagnosis not present

## 2019-03-22 DIAGNOSIS — N6320 Unspecified lump in the left breast, unspecified quadrant: Secondary | ICD-10-CM

## 2019-03-22 DIAGNOSIS — F17200 Nicotine dependence, unspecified, uncomplicated: Secondary | ICD-10-CM | POA: Diagnosis not present

## 2019-03-22 DIAGNOSIS — C50912 Malignant neoplasm of unspecified site of left female breast: Secondary | ICD-10-CM

## 2019-03-22 HISTORY — PX: BREAST LUMPECTOMY WITH RADIOACTIVE SEED AND SENTINEL LYMPH NODE BIOPSY: SHX6550

## 2019-03-22 SURGERY — BREAST LUMPECTOMY WITH RADIOACTIVE SEED AND SENTINEL LYMPH NODE BIOPSY
Anesthesia: General | Site: Breast | Laterality: Left

## 2019-03-22 MED ORDER — PROMETHAZINE HCL 25 MG/ML IJ SOLN: 6.2500 mg | INTRAMUSCULAR | Status: DC | PRN

## 2019-03-22 MED ORDER — TECHNETIUM TC 99M SULFUR COLLOID FILTERED
1.0000 | Freq: Once | INTRAVENOUS | Status: AC | PRN
  Administered 2019-03-22: 1 via INTRADERMAL

## 2019-03-22 MED ORDER — FENTANYL CITRATE (PF) 250 MCG/5ML IJ SOLN
INTRAMUSCULAR | Status: DC | PRN
  Administered 2019-03-22: 50 ug via INTRAVENOUS
  Administered 2019-03-22 (×2): 25 ug via INTRAVENOUS
  Administered 2019-03-22: 50 ug via INTRAVENOUS
  Administered 2019-03-22: 25 ug via INTRAVENOUS

## 2019-03-22 MED ORDER — DEXAMETHASONE SODIUM PHOSPHATE 10 MG/ML IJ SOLN
INTRAMUSCULAR | Status: AC
  Filled 2019-03-22: qty 1

## 2019-03-22 MED ORDER — FENTANYL CITRATE (PF) 100 MCG/2ML IJ SOLN
25.0000 ug | INTRAMUSCULAR | Status: DC | PRN
  Administered 2019-03-22: 25 ug via INTRAVENOUS

## 2019-03-22 MED ORDER — 0.9 % SODIUM CHLORIDE (POUR BTL) OPTIME
TOPICAL | Status: DC | PRN
  Administered 2019-03-22: 1000 mL

## 2019-03-22 MED ORDER — PROPOFOL 10 MG/ML IV BOLUS
INTRAVENOUS | Status: DC | PRN
  Administered 2019-03-22: 50 mg via INTRAVENOUS
  Administered 2019-03-22: 150 mg via INTRAVENOUS

## 2019-03-22 MED ORDER — EPHEDRINE 5 MG/ML INJ
INTRAVENOUS | Status: AC
  Filled 2019-03-22: qty 10

## 2019-03-22 MED ORDER — CEFAZOLIN SODIUM-DEXTROSE 2-4 GM/100ML-% IV SOLN
2.0000 g | INTRAVENOUS | Status: AC
  Administered 2019-03-22: 2 g via INTRAVENOUS
  Filled 2019-03-22: qty 100

## 2019-03-22 MED ORDER — LIDOCAINE 2% (20 MG/ML) 5 ML SYRINGE
INTRAMUSCULAR | Status: AC
  Filled 2019-03-22: qty 5

## 2019-03-22 MED ORDER — CELECOXIB 200 MG PO CAPS
200.0000 mg | ORAL_CAPSULE | ORAL | Status: AC
  Administered 2019-03-22: 200 mg via ORAL
  Filled 2019-03-22: qty 1

## 2019-03-22 MED ORDER — BUPIVACAINE-EPINEPHRINE (PF) 0.25% -1:200000 IJ SOLN
INTRAMUSCULAR | Status: AC
  Filled 2019-03-22: qty 30

## 2019-03-22 MED ORDER — BUPIVACAINE-EPINEPHRINE 0.25% -1:200000 IJ SOLN
INTRAMUSCULAR | Status: DC | PRN
  Administered 2019-03-22: 20 mL

## 2019-03-22 MED ORDER — BUPIVACAINE LIPOSOME 1.3 % IJ SUSP
INTRAMUSCULAR | Status: DC | PRN
  Administered 2019-03-22: 10 mL

## 2019-03-22 MED ORDER — ONDANSETRON HCL 4 MG/2ML IJ SOLN
INTRAMUSCULAR | Status: AC
  Filled 2019-03-22: qty 2

## 2019-03-22 MED ORDER — HYDROCODONE-ACETAMINOPHEN 5-325 MG PO TABS: 1.0000 | ORAL_TABLET | Freq: Four times a day (QID) | ORAL | 0 refills | Status: DC | PRN

## 2019-03-22 MED ORDER — MIDAZOLAM HCL 2 MG/2ML IJ SOLN
INTRAMUSCULAR | Status: AC
  Filled 2019-03-22: qty 2

## 2019-03-22 MED ORDER — PROPOFOL 10 MG/ML IV BOLUS
INTRAVENOUS | Status: AC
  Filled 2019-03-22: qty 20

## 2019-03-22 MED ORDER — CHLORHEXIDINE GLUCONATE CLOTH 2 % EX PADS: 6.0000 | MEDICATED_PAD | Freq: Once | CUTANEOUS | Status: DC

## 2019-03-22 MED ORDER — ONDANSETRON HCL 4 MG/2ML IJ SOLN
INTRAMUSCULAR | Status: DC | PRN
  Administered 2019-03-22: 4 mg via INTRAVENOUS

## 2019-03-22 MED ORDER — SODIUM CHLORIDE (PF) 0.9 % IJ SOLN
INTRAVENOUS | Status: DC | PRN
  Administered 2019-03-22: 1 mL

## 2019-03-22 MED ORDER — MIDAZOLAM HCL 2 MG/2ML IJ SOLN
INTRAMUSCULAR | Status: DC | PRN
  Administered 2019-03-22: 2 mg via INTRAVENOUS

## 2019-03-22 MED ORDER — ALBUTEROL SULFATE HFA 108 (90 BASE) MCG/ACT IN AERS
INHALATION_SPRAY | RESPIRATORY_TRACT | Status: AC
  Filled 2019-03-22: qty 13.4

## 2019-03-22 MED ORDER — ROCURONIUM BROMIDE 10 MG/ML (PF) SYRINGE
PREFILLED_SYRINGE | INTRAVENOUS | Status: AC
  Filled 2019-03-22: qty 10

## 2019-03-22 MED ORDER — PHENYLEPHRINE 40 MCG/ML (10ML) SYRINGE FOR IV PUSH (FOR BLOOD PRESSURE SUPPORT)
PREFILLED_SYRINGE | INTRAVENOUS | Status: AC
  Filled 2019-03-22: qty 10

## 2019-03-22 MED ORDER — ACETAMINOPHEN 500 MG PO TABS
1000.0000 mg | ORAL_TABLET | ORAL | Status: AC
  Administered 2019-03-22: 1000 mg via ORAL
  Filled 2019-03-22: qty 2

## 2019-03-22 MED ORDER — FENTANYL CITRATE (PF) 250 MCG/5ML IJ SOLN
INTRAMUSCULAR | Status: AC
  Filled 2019-03-22: qty 5

## 2019-03-22 MED ORDER — DEXAMETHASONE SODIUM PHOSPHATE 10 MG/ML IJ SOLN
INTRAMUSCULAR | Status: DC | PRN
  Administered 2019-03-22: 10 mg via INTRAVENOUS

## 2019-03-22 MED ORDER — FENTANYL CITRATE (PF) 100 MCG/2ML IJ SOLN
INTRAMUSCULAR | Status: AC
  Filled 2019-03-22: qty 2

## 2019-03-22 MED ORDER — EPHEDRINE SULFATE-NACL 50-0.9 MG/10ML-% IV SOSY
PREFILLED_SYRINGE | INTRAVENOUS | Status: DC | PRN
  Administered 2019-03-22: 10 mg via INTRAVENOUS

## 2019-03-22 MED ORDER — LACTATED RINGERS IV SOLN
INTRAVENOUS | Status: DC | PRN
  Administered 2019-03-22: 07:00:00 via INTRAVENOUS

## 2019-03-22 MED ORDER — ALBUTEROL SULFATE HFA 108 (90 BASE) MCG/ACT IN AERS
INHALATION_SPRAY | RESPIRATORY_TRACT | Status: DC | PRN
  Administered 2019-03-22: 2 via RESPIRATORY_TRACT

## 2019-03-22 MED ORDER — BUPIVACAINE-EPINEPHRINE (PF) 0.5% -1:200000 IJ SOLN
INTRAMUSCULAR | Status: DC | PRN
  Administered 2019-03-22: 15 mL

## 2019-03-22 MED ORDER — LIDOCAINE 2% (20 MG/ML) 5 ML SYRINGE
INTRAMUSCULAR | Status: DC | PRN
  Administered 2019-03-22: 20 mg via INTRAVENOUS
  Administered 2019-03-22: 40 mg via INTRAVENOUS

## 2019-03-22 MED ORDER — METHYLENE BLUE 0.5 % INJ SOLN
INTRAVENOUS | Status: AC
  Filled 2019-03-22: qty 10

## 2019-03-22 SURGICAL SUPPLY — 43 items
ADH SKN CLS APL DERMABOND .7 (GAUZE/BANDAGES/DRESSINGS) ×1
APL PRP STRL LF DISP 70% ISPRP (MISCELLANEOUS) ×1
BINDER BREAST LRG (GAUZE/BANDAGES/DRESSINGS) ×2 IMPLANT
BINDER BREAST XLRG (GAUZE/BANDAGES/DRESSINGS) IMPLANT
CANISTER SUCT 3000ML PPV (MISCELLANEOUS) IMPLANT
CHLORAPREP W/TINT 26 (MISCELLANEOUS) ×2 IMPLANT
CLIP VESOCCLUDE SM WIDE 6/CT (CLIP) ×2 IMPLANT
COVER PROBE W GEL 5X96 (DRAPES) ×2 IMPLANT
COVER SURGICAL LIGHT HANDLE (MISCELLANEOUS) ×2 IMPLANT
COVER WAND RF STERILE (DRAPES) ×1 IMPLANT
DECANTER SPIKE VIAL GLASS SM (MISCELLANEOUS) ×1 IMPLANT
DERMABOND ADVANCED (GAUZE/BANDAGES/DRESSINGS) ×1
DERMABOND ADVANCED .7 DNX12 (GAUZE/BANDAGES/DRESSINGS) ×1 IMPLANT
DEVICE DUBIN SPECIMEN MAMMOGRA (MISCELLANEOUS) ×2 IMPLANT
DRAPE CHEST BREAST 15X10 FENES (DRAPES) ×2 IMPLANT
ELECT COATED BLADE 2.86 ST (ELECTRODE) ×2 IMPLANT
ELECT REM PT RETURN 9FT ADLT (ELECTROSURGICAL) ×2
ELECTRODE REM PT RTRN 9FT ADLT (ELECTROSURGICAL) ×1 IMPLANT
GAUZE SPONGE 4X4 12PLY STRL (GAUZE/BANDAGES/DRESSINGS) ×2 IMPLANT
GLOVE SURG SIGNA 7.5 PF LTX (GLOVE) ×4 IMPLANT
GOWN STRL REUS W/ TWL LRG LVL3 (GOWN DISPOSABLE) ×1 IMPLANT
GOWN STRL REUS W/ TWL XL LVL3 (GOWN DISPOSABLE) ×1 IMPLANT
GOWN STRL REUS W/TWL LRG LVL3 (GOWN DISPOSABLE) ×2
GOWN STRL REUS W/TWL XL LVL3 (GOWN DISPOSABLE) ×2
ILLUMINATOR WAVEGUIDE N/F (MISCELLANEOUS) IMPLANT
KIT BASIN OR (CUSTOM PROCEDURE TRAY) ×2 IMPLANT
KIT MARKER MARGIN INK (KITS) ×2 IMPLANT
LIGHT WAVEGUIDE WIDE FLAT (MISCELLANEOUS) IMPLANT
NDL 18GX1X1/2 (RX/OR ONLY) (NEEDLE) IMPLANT
NDL FILTER BLUNT 18X1 1/2 (NEEDLE) IMPLANT
NDL HYPO 25GX1X1/2 BEV (NEEDLE) ×1 IMPLANT
NEEDLE 18GX1X1/2 (RX/OR ONLY) (NEEDLE) IMPLANT
NEEDLE FILTER BLUNT 18X 1/2SAF (NEEDLE) ×1
NEEDLE FILTER BLUNT 18X1 1/2 (NEEDLE) ×1 IMPLANT
NEEDLE HYPO 25GX1X1/2 BEV (NEEDLE) ×2 IMPLANT
NS IRRIG 1000ML POUR BTL (IV SOLUTION) ×2 IMPLANT
PACK GENERAL/GYN (CUSTOM PROCEDURE TRAY) ×2 IMPLANT
PAD ABD 8X10 STRL (GAUZE/BANDAGES/DRESSINGS) ×2 IMPLANT
SUT MNCRL AB 4-0 PS2 18 (SUTURE) ×2 IMPLANT
SUT VIC AB 3-0 SH 8-18 (SUTURE) ×2 IMPLANT
SYR CONTROL 10ML LL (SYRINGE) ×3 IMPLANT
TOWEL GREEN STERILE (TOWEL DISPOSABLE) ×2 IMPLANT
TOWEL GREEN STERILE FF (TOWEL DISPOSABLE) ×2 IMPLANT

## 2019-03-22 NOTE — Discharge Instructions (Signed)
[facility] - DISCHARGE INSTRUCTIONS TO PATIENT   Activity:  Driving - May drive in 2 or 3 days, if doing well   Lifting - No lifting more than 15 pounds for one week, then no limit.                       Practice you Covid-19 protection:  Wear a mask, social distance, and wash your hands frequently  Wound Care:   Leave the incision dry for 2 days, then you may shower  Diet:  As tolerated  Follow up appointment:  Call Dr. Pollie Friar office [facility]) at [phone] for an appointment in 2 to 3 weeks..  Medications and dosages:  Resume your home medications.  You have a prescription for:  Vicodin  Call Dr. [name] or his office  [phone]) if you have:  Temperature greater than 100.4,  Persistent nausea and vomiting,  Severe uncontrolled pain,  Redness, tenderness, or signs of infection (pain, swelling, redness, odor or green/yellow discharge around the site),  Difficulty breathing, headache or visual disturbances,  Any other questions or concerns you may have after discharge.  In an emergency, call 911 or go to an Emergency Department at a nearby hospital.

## 2019-03-22 NOTE — Assessment & Plan Note (Addendum)
02/07/2019:Diagnostic mammogram following palpable lump in the left breast x6 months showed 3.0cm mass at the 9 oc'clock position 2cm from the nipple. Biopsy confirmed grade 3 IDC, HER2 negative by FISH, ER+ (100%), PR+ (80%), Ki67 20%. Stage 2A Breast MRI 02/16/2019: Biopsy-proven inner left breast cancer 2.9 x 2.6 with 3.2 cm, non-mass enhancement 3.8 cm linearly.  Also extends posteriorly from the mass and additional 1.6 cm making it an overall 5 cm, skin involvement with enhancement, additional small masses 5 mm multicentric disease no lymph nodes  Surgery: 03/14/2019:Left lumpectomy [name]): IDC, 2.9cm, grade 3, high grade DCIS, lymphovascular invasion present, clear margins and 1/3 lymph nodes positive for carcinoma measuring 0.7cm.  ER 100%, PR 80%, Ki-67 20%, HER-2 negative T2N1 stage IIa  Recommendation: 1.  MammaPrint testing to determine if she would benefit from chemotherapy 2.  Followed by adjuvant radiation 3.  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  I gave her a letter to avoid COVID-19 exposure.  Return to clinic based upon MammaPrint test results.

## 2019-03-22 NOTE — Anesthesia Procedure Notes (Signed)
Anesthesia Regional Block: Pectoralis block   Pre-Anesthetic Checklist: ,, timeout performed, Correct Patient, Correct Site, Correct Laterality, Correct Procedure, Correct Position, site marked, Risks and benefits discussed,  Surgical consent,  Pre-op evaluation,  At surgeon's request and post-op pain management  Laterality: Left  Prep: chloraprep       Needles:  Injection technique: Single-shot  Needle Type: Echogenic Stimulator Needle     Needle Length: 10cm  Needle Gauge: 21     Additional Needles:   Narrative:  Start time: 03/22/2019 6:53 AM End time: 03/22/2019 7:03 AM Injection made incrementally with aspirations every 5 mL.  Performed by: Personally

## 2019-03-22 NOTE — Op Note (Addendum)
03/22/2019  9:39 AM  PATIENT:  Linda Spears DOB: Feb 21, 1961 MRN: [id number]  PREOP DIAGNOSIS:   LEFT BREAST CANCER, DISCORDANT LESION LEFT BREAST  POSTOP DIAGNOSIS:    Left breast cancer, 9 o'clock position (T2, N0), Discordant left breast lesion at 10 o'clock  PROCEDURE:   Procedure(s):  LEFT BREAST LUMPECTOMY X2 WITH RADIOACTIVE SEED AND LEFT AXILLARY SENTINEL LYMPH NODE BIOPSY, Injection of peri areolar area of breast with methylene blue (1.0 cc), deep sentinel lymph node biopsy  SURGEON:   Alphonsa Overall, M.D.  ANESTHESIA:   General  Anesthesiologist: Duane Boston, MD CRNA: Mariea Clonts, CRNA; Renato Shin, CRNA  General  EBL:  75  ml  DRAINS:  none   LOCAL MEDICATIONS USED:   20 cc 1/4% marcaine and left pectoral block by anesthesia  SPECIMEN:   Left breast lumpectomy (6 color paint), Superior posterior margin (painted anterior/posterior), and left axillary sentinel lymph node biopsy (Counts - 200, background - 5, blue)  COUNTS CORRECT:  YES  INDICATIONS FOR PROCEDURE:  Linda Spears is a 58 y.o. (DOB: 09-Mar-1961) AA female whose primary care physician is Clent Demark, PA-C and comes for left breast lumpectomy and left axillary sentinel lymph node biopsy.   The options for breast cancer treatment have been discussed with the patient. She elected to proceed with lumpectomy and axillary sentinel lymph node.    [facility] with Drs. Gudena and Kinard.  She has a know left breast cancer at 9 o'clock.  There was a lesion seen on MRI in the UIQ at 10 o'clock which was biopsied, but negative.  This was thought to be discordant.  This area will be removed at the same time as the lumpectomy.    The indications and potential complications of surgery were explained to the patient. Potential complications include, but are not limited to, bleeding, infection, the need for further surgery, and nerve injury.     She had a two I131  seed placed on 03/21/2019 in her left breast at [facility].  The 2 seeds are in the 9 o'clock and 10 o'clock position of the left breast.   In the holding area, her left areola was injected with 1 millicurie of Technitium Sulfur Colloid.  OPERATIVE NOTE:   The patient was taken to operating room # 8 at [facility] where she underwent a general anesthesia  supervised by Anesthesiologist: Duane Boston, MD CRNA: Mariea Clonts, CRNA; Renato Shin, CRNA. Her left breast and axilla were prepped with  ChloraPrep and sterilely draped.    A time-out and the surgical check list was reviewed.    I injected about 1.0 mL of 40% methylene blue around her left areola.   I was going to get the two seeds out in one block, since the MRI suggested that the tumor extended beyond what was seen on mammogram.  I made a medial radial incision that I carried in the inferior circumareolar area of the nipple.  I made an en bloc excision of the tmor to the chest wall.  The whole mass measured about 6.0 cm.  She is small breasted.  So I removed probably 40% of her breast tissue medially.  She had a hematoma medial to the tumor, that I think is benign, but I included in the specimen.     I used the Neoprobe to identify the two I131 seeds.  I tried to excise an area around the tumor  of at least 1 cm.    I excised this block of breast tissue approximately 6 cm by 8 cm  in diameter.   I painted the lumpectomy specimen with the 6 color paint kit and did a specimen mammogram.  The specimen mammogram confirmed the seed in the primary tumor.  The seed at the 10 o'clock position became dislodged and was identified separately.  The cliip that the 10 o'clock seed identified I did not retrive, but I think was in the pectoralis muscle.   I did take an additional biopsy which I labeled superior posterior excision.  The specimen was sent to pathology who called back to confirm that they have the seed and the specimen.   I then  started the left deep axillary sentinel lymph node biopsy. I made an incision in the left axilla.  I found a hot area at the junction of the breast and the pectoralis major muscle, deep in the axilla. I cut down and  identified a hot node that had counts of 200 and the background has 5 counts. The lymph node was blue. I checked her internal mammary nodes and supraclavicular nodes with the neoprobe and found no other hot area. The axillary node was then sent to pathology.    I then irrigated the wound with saline. I infiltrated approximately 20 mL of 1/4% Marcaine between the incisions. I placed 4 clips to mark biopsy cavity, at 12, 3, 6, and 9 o'clock.  I then closed all the wounds in layers using 3-0 Vicryl sutures for the deep layer. The left breast is distorted because of the amount of breast tissue that I have to remove.  At the skin, I closed the incisions with a 4-0 Monocryl suture. The incisions were then painted with Dermabond.  She had gauze place over the wounds and placed in a breast binder.   The patient tolerated the procedure well, was transported to the recovery room in good condition. Sponge and needle count were correct at the end of the case.   Final pathology is pending.    Left breast line drawing of surgery  Alphonsa Overall, MD, [facility] Pager: [phone] Office phone:  [phone]

## 2019-03-22 NOTE — Anesthesia Procedure Notes (Signed)
Procedure Name: LMA Insertion Date/Time: 03/22/2019 7:37 AM Performed by: Renato Shin, CRNA Pre-anesthesia Checklist: Patient identified, Emergency Drugs available, Suction available and Patient being monitored Patient Re-evaluated:Patient Re-evaluated prior to induction Oxygen Delivery Method: Circle system utilized Preoxygenation: Pre-oxygenation with 100% oxygen Induction Type: IV induction LMA: LMA inserted LMA Size: 4.0 Number of attempts: 1 Placement Confirmation: positive ETCO2 and breath sounds checked- equal and bilateral Tube secured with: Tape Dental Injury: Teeth and Oropharynx as per pre-operative assessment

## 2019-03-22 NOTE — Anesthesia Postprocedure Evaluation (Signed)
Anesthesia Post Note  Patient: CHASELYN NANNEY  Procedure(s) Performed: LEFT BREAST LUMPECTOMY X2 WITH RADIOACTIVE SEED AND LEFT AXILLARY SENTINEL LYMPH NODE BIOPSY (Left Breast)     Patient location during evaluation: PACU Anesthesia Type: General Level of consciousness: sedated Pain management: pain level controlled Vital Signs Assessment: post-procedure vital signs reviewed and stable Respiratory status: spontaneous breathing and respiratory function stable Cardiovascular status: stable Postop Assessment: no apparent nausea or vomiting Anesthetic complications: no    Last Vitals:  Vitals:   03/22/19 1100 03/22/19 1104  BP:  (!) 150/83  Pulse: (!) 50 70  Resp: 18 17  Temp: (!) 36.4 C   SpO2: 100% 100%    Last Pain:  Vitals:   03/22/19 1100  PainSc: 3                  Britnie Colville DANIEL

## 2019-03-22 NOTE — Interval H&P Note (Signed)
History and Physical Interval Note:  03/22/2019 7:18 AM  Linda Spears  has presented today for surgery, with the diagnosis of LEFT BREAST CANCER, DISCORDANT LESION LEFT BREAST.  The various methods of treatment have been discussed with the patient and family. To talk to daughter, Theodoro Grist, after case.  After consideration of risks, benefits and other options for treatment, the patient has consented to  Procedure(s): LEFT BREAST LUMPECTOMY X2 WITH RADIOACTIVE SEED AND LEFT AXILLARY SENTINEL LYMPH NODE BIOPSY (Left) as a surgical intervention.  The patient's history has been reviewed, patient examined, no change in status, stable for surgery.  I have reviewed the patient's chart and labs.  Questions were answered to the patient's satisfaction.     Shann Medal

## 2019-03-22 NOTE — Transfer of Care (Signed)
Immediate Anesthesia Transfer of Care Note  Patient: Linda Spears  Procedure(s) Performed: LEFT BREAST LUMPECTOMY X2 WITH RADIOACTIVE SEED AND LEFT AXILLARY SENTINEL LYMPH NODE BIOPSY (Left Breast)  Patient Location: PACU  Anesthesia Type:General and GA combined with regional for post-op pain  Level of Consciousness: awake, drowsy and patient cooperative  Airway & Oxygen Therapy: Patient Spontanous Breathing and Patient connected to nasal cannula oxygen  Post-op Assessment: Report given to RN and Post -op Vital signs reviewed and stable  Post vital signs: Reviewed and stable  Last Vitals:  Vitals Value Taken Time  BP 138/83 03/22/19 0952  Temp 36.5 C 03/22/19 0952  Pulse 93 03/22/19 0952  Resp 25 03/22/19 0952  SpO2 100 % 03/22/19 0952  Vitals shown include unvalidated device data.  Last Pain:  Vitals:   03/22/19 0624  PainSc: 0-No pain      Patients Stated Pain Goal: 2 (76/81/15 7262)  Complications: No apparent anesthesia complications

## 2019-03-23 ENCOUNTER — Encounter (HOSPITAL_COMMUNITY): Payer: Self-pay | Admitting: Surgery

## 2019-03-24 ENCOUNTER — Telehealth: Payer: Self-pay | Admitting: *Deleted

## 2019-03-24 NOTE — Telephone Encounter (Signed)
Received order for mammaprint testing per Dr. Lindi Adie. Requisition faxed to [facility] and pathology.

## 2019-03-27 ENCOUNTER — Other Ambulatory Visit: Payer: Self-pay

## 2019-03-28 ENCOUNTER — Encounter (HOSPITAL_COMMUNITY): Payer: Self-pay | Admitting: *Deleted

## 2019-03-28 NOTE — Progress Notes (Signed)
Patient Care Team: Tawny Asal as PCP - General (Physician Assistant) Mauro Kaufmann, RN as Oncology Nurse Navigator Rockwell Germany, RN as Oncology Nurse Navigator Alphonsa Overall, MD as Consulting Physician (General Surgery) Nicholas Lose, MD as Consulting Physician (Hematology and Oncology) Gery Pray, MD as Consulting Physician (Radiation Oncology)  DIAGNOSIS:    ICD-10-CM   1. Malignant neoplasm of upper-inner quadrant of left breast in female, estrogen receptor positive ([place])  C50.212    Z17.0     SUMMARY OF ONCOLOGIC HISTORY: Oncology History  Malignant neoplasm of upper-inner quadrant of left breast in female, estrogen receptor positive ([place])  02/07/2019 Initial Diagnosis   Diagnostic mammogram following palpable lump in the left breast x6 months showed 3.0cm mass at the 9 oc'clock position 2cm from the nipple. Biopsy confirmed grade 3 IDC, HER2 negative by FISH, ER+ (100%), PR+ (80%), Ki67 20%.    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    03/29/2019 Cancer Staging   Staging form: Breast, AJCC 8th Edition - Pathologic stage from 03/29/2019: Stage IIA (pT2, pN1a, cM0, G3, ER+, PR+, HER2-) - Signed by Nicholas Lose, MD on 03/29/2019     CHIEF COMPLIANT: Follow-up s/p lumpectomy to review pathology  INTERVAL HISTORY: Linda Spears is a 58 y.o. with above-mentioned history of left breast cancer. She underwent a lumpectomy on 03/22/19 with Dr. [name] for which pathology showed 2.9cm, grade 3 invasive ductal carcinoma, with high grade DCIS, lymphovascular invasion present, clear margins and 1/3 lymph nodes positive for carcinoma  measuring 0.7cm. She presents to the clinic today to discuss the pathology report and further treatment.   REVIEW OF SYSTEMS:   Constitutional: Denies fevers, chills or abnormal weight loss Eyes: Denies blurriness of vision Ears, nose, mouth, throat, and face: Denies mucositis or sore throat Respiratory: Denies cough, dyspnea or wheezes Cardiovascular: Denies palpitation, chest discomfort Gastrointestinal: Denies nausea, heartburn or change in bowel habits Skin: Denies abnormal skin rashes Lymphatics: Denies new lymphadenopathy or easy bruising Neurological: Denies numbness, tingling or new weaknesses Behavioral/Psych: Mood is stable, no new changes  Extremities: No lower extremity edema Breast: s/p left lumpectomy All other systems were reviewed with the patient and are negative.  I have reviewed the past medical history, past surgical history, social history and family history with the patient and they are unchanged from previous note.  ALLERGIES:  has No Known Allergies.  MEDICATIONS:  Current Outpatient Medications  Medication Sig Dispense Refill  . HYDROcodone-acetaminophen (NORCO/VICODIN) 5-325 MG tablet Take 1 tablet by mouth every 6 (six) hours as needed for moderate pain. 15 tablet 0   No current facility-administered medications for this visit.     PHYSICAL EXAMINATION: ECOG PERFORMANCE STATUS: 1 - Symptomatic but completely ambulatory  Vitals:   03/29/19 0843  BP: (!) 172/76  Pulse: (!) 55  Resp: 18  Temp: 98.2 F (36.8 C)  SpO2: 100%   Filed Weights   03/29/19 0843  Weight: 120 lb 11.2 oz (54.7 kg)    GENERAL: alert, no distress and comfortable SKIN: skin color, texture, turgor are normal, no rashes or significant lesions EYES: normal, Conjunctiva are pink and non-injected, sclera clear OROPHARYNX: no  exudate, no erythema and lips, buccal mucosa, and tongue normal  NECK: supple, thyroid normal size, non-tender, without nodularity LYMPH: no palpable  lymphadenopathy in the cervical, axillary or inguinal LUNGS: clear to auscultation and percussion with normal breathing effort HEART: regular rate & rhythm and no murmurs and no lower extremity edema ABDOMEN: abdomen soft, non-tender and normal bowel sounds MUSCULOSKELETAL: no cyanosis of digits and no clubbing  NEURO: alert & oriented x 3 with fluent speech, no focal motor/sensory deficits EXTREMITIES: No lower extremity edema  LABORATORY DATA:  I have reviewed the data as listed CMP Latest Ref Rng & Units 03/17/2019 02/09/2019 11/13/2016  Glucose 70 - 99 mg/dL 83 119(H) 107(H)  BUN 6 - 20 mg/dL _0 Creatinine 0.44 - 1.00 mg/dL 0.76 0.94 0.97  Sodium 135 - 145 mmol/L 136 138 141  Potassium 3.5 - 5.1 mmol/L 3.9 4.1 5.3(H)  Chloride 98 - 111 mmol/L 103 104 99  CO2 22 - 32 mmol/L _1 Calcium 8.9 - 10.3 mg/dL 9.0 9.4 10.0  Total Protein 6.5 - 8.1 g/dL - 7.9 8.2  Total Bilirubin 0.3 - 1.2 mg/dL - 0.4 0.3  Alkaline Phos 38 - 126 U/L - 79 94  AST 15 - 41 U/L - 25 25  ALT 0 - 44 U/L - 18 19    Lab Results  Component Value Date   WBC 6.2 03/17/2019   HGB 12.7 03/17/2019   HCT 41.0 03/17/2019   MCV 88.7 03/17/2019   PLT 282 03/17/2019   NEUTROABS 3.1 02/09/2019    ASSESSMENT & PLAN:  Malignant neoplasm of upper-inner quadrant of left breast in female, estrogen receptor positive ([place]) 02/07/2019:Diagnostic mammogram following palpable lump in the left breast x6 months showed 3.0cm mass at the 9 oc'clock position 2cm from the nipple. Biopsy confirmed grade 3 IDC, HER2 negative by FISH, ER+ (100%), PR+ (80%), Ki67 20%. Stage 2A Breast MRI 02/16/2019: Biopsy-proven inner left breast cancer 2.9 x 2.6 with 3.2 cm, non-mass enhancement 3.8 cm linearly.  Also extends posteriorly from the mass and additional 1.6 cm making it an overall 5 cm, skin involvement with enhancement, additional small masses 5 mm multicentric disease no lymph nodes  Surgery: 03/14/2019:Left lumpectomy [name]):  IDC, 2.9cm, grade 3, high grade DCIS, lymphovascular invasion present, clear margins and 1/3 lymph nodes positive for carcinoma measuring 0.7cm.  ER 100%, PR 80%, Ki-67 20%, HER-2 negative T2N1 stage IIa  Recommendation: 1.  MammaPrint testing to determine if she would benefit from chemotherapy 2.  Followed by adjuvant radiation 3.  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  I gave her a letter to avoid COVID-19 exposure.  Return to clinic based upon MammaPrint test results.     No orders of the defined types were placed in this encounter.  The patient has a good understanding of the overall plan. she agrees with it. she will call with any problems that may develop before the next visit here.  Nicholas Lose, MD 03/29/2019  Julious Oka Dorshimer am acting as scribe for Dr. Nicholas Lose.  I have reviewed the above documentation for accuracy and completeness, and I agree with the above.

## 2019-03-29 ENCOUNTER — Inpatient Hospital Stay: Payer: Medicaid Other | Attending: Hematology and Oncology | Admitting: Hematology and Oncology

## 2019-03-29 ENCOUNTER — Other Ambulatory Visit: Payer: Self-pay

## 2019-03-29 ENCOUNTER — Telehealth: Payer: Self-pay | Admitting: Physician Assistant

## 2019-03-29 DIAGNOSIS — C773 Secondary and unspecified malignant neoplasm of axilla and upper limb lymph nodes: Secondary | ICD-10-CM | POA: Insufficient documentation

## 2019-03-29 DIAGNOSIS — C50212 Malignant neoplasm of upper-inner quadrant of left female breast: Secondary | ICD-10-CM

## 2019-03-29 DIAGNOSIS — Z170 Estrogen receptor positive status [ER+]: Secondary | ICD-10-CM | POA: Diagnosis not present

## 2019-03-29 NOTE — Telephone Encounter (Signed)
Patient is enrolled in the transportation program and will call when she will set her first pick up because she has a ride for this week and next

## 2019-04-01 ENCOUNTER — Encounter (HOSPITAL_COMMUNITY): Payer: Self-pay | Admitting: Hematology and Oncology

## 2019-04-04 ENCOUNTER — Telehealth: Payer: Self-pay | Admitting: *Deleted

## 2019-04-04 NOTE — Telephone Encounter (Signed)
Received mammaprint results of high risk.  Patient aware.  Confirmed appointment for 04/08/19 at 815am.  Offered appointments sooner but patient needed early am because her daughter brings her and she has to work.

## 2019-04-06 ENCOUNTER — Other Ambulatory Visit (HOSPITAL_COMMUNITY): Payer: Self-pay | Admitting: Surgery

## 2019-04-07 NOTE — Progress Notes (Signed)
Patient Care Team: Tawny Asal as PCP - General (Physician Assistant) Mauro Kaufmann, RN as Oncology Nurse Navigator Rockwell Germany, RN as Oncology Nurse Navigator Alphonsa Overall, MD as Consulting Physician (General Surgery) Nicholas Lose, MD as Consulting Physician (Hematology and Oncology) Gery Pray, MD as Consulting Physician (Radiation Oncology)  DIAGNOSIS:    ICD-10-CM   1. Malignant neoplasm of upper-inner quadrant of left breast in female, estrogen receptor positive ([place])  C50.212    Z17.0     SUMMARY OF ONCOLOGIC HISTORY: Oncology History  Malignant neoplasm of upper-inner quadrant of left breast in female, estrogen receptor positive ([place])  02/07/2019 Initial Diagnosis   Diagnostic mammogram following palpable lump in the left breast x6 months showed 3.0cm mass at the 9 oc'clock position 2cm from the nipple. Biopsy confirmed grade 3 IDC, HER2 negative by FISH, ER+ (100%), PR+ (80%), Ki67 20%.    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    03/29/2019 Cancer Staging   Staging form: Breast, AJCC 8th Edition - Pathologic stage from 03/29/2019: Stage IIA (pT2, pN1a, cM0, G3, ER+, PR+, HER2-) - Signed by Nicholas Lose, MD on 03/29/2019     CHIEF COMPLIANT: Follow-up to discuss Mammaprint results  INTERVAL HISTORY: Linda Spears is a 58 y.o. with above-mentioned history of left breast cancer who underwent a lumpectomy. Mammaprint testing showed she was high risk. She presents to the clinic today to discuss  Her results and further treatment.   REVIEW OF SYSTEMS:   Constitutional: Denies fevers, chills or abnormal weight loss  Eyes: Denies blurriness of vision Ears, nose, mouth, throat, and face: Denies mucositis or sore throat Respiratory: Denies cough, dyspnea or wheezes Cardiovascular: Denies palpitation, chest discomfort Gastrointestinal: Denies nausea, heartburn or change in bowel habits Skin: Denies abnormal skin rashes Lymphatics: Denies new lymphadenopathy or easy bruising Neurological: Denies numbness, tingling or new weaknesses Behavioral/Psych: Mood is stable, no new changes  Extremities: No lower extremity edema Breast: denies any pain or lumps or nodules in either breasts All other systems were reviewed with the patient and are negative.  I have reviewed the past medical history, past surgical history, social history and family history with the patient and they are unchanged from previous note.  ALLERGIES:  has No Known Allergies.  MEDICATIONS:  Current Outpatient Medications  Medication Sig Dispense Refill  . HYDROcodone-acetaminophen (NORCO/VICODIN) 5-325 MG tablet Take 1 tablet by mouth every 6 (six) hours as needed for moderate pain. 15 tablet 0   No current facility-administered medications for this visit.     PHYSICAL EXAMINATION: ECOG PERFORMANCE STATUS: 1 - Symptomatic but completely ambulatory  Vitals:   04/08/19 0829  BP: (!) 170/85  Pulse: (!) 57  Resp: 17  Temp: 98 F (36.7 C)  SpO2: 100%   Filed Weights   04/08/19 0829  Weight: 123 lb 14.4 oz (56.2 kg)    GENERAL: alert, no distress and comfortable SKIN: skin color, texture, turgor are normal, no rashes or significant lesions EYES: normal, Conjunctiva are pink and non-injected, sclera clear OROPHARYNX: no exudate, no erythema and lips, buccal mucosa, and tongue normal  NECK: supple, thyroid normal size, non-tender, without nodularity LYMPH:  no palpable lymphadenopathy in the cervical, axillary or inguinal LUNGS: clear to auscultation and percussion with normal breathing effort HEART: regular rate & rhythm and no  murmurs and no lower extremity edema ABDOMEN: abdomen soft, non-tender and normal bowel sounds MUSCULOSKELETAL: no cyanosis of digits and no clubbing  NEURO: alert & oriented x 3 with fluent speech, no focal motor/sensory deficits EXTREMITIES: No lower extremity edema  LABORATORY DATA:  I have reviewed the data as listed CMP Latest Ref Rng & Units 03/17/2019 02/09/2019 11/13/2016  Glucose 70 - 99 mg/dL 83 119(H) 107(H)  BUN 6 - 20 mg/dL _0 Creatinine 0.44 - 1.00 mg/dL 0.76 0.94 0.97  Sodium 135 - 145 mmol/L 136 138 141  Potassium 3.5 - 5.1 mmol/L 3.9 4.1 5.3(H)  Chloride 98 - 111 mmol/L 103 104 99  CO2 22 - 32 mmol/L _1 Calcium 8.9 - 10.3 mg/dL 9.0 9.4 10.0  Total Protein 6.5 - 8.1 g/dL - 7.9 8.2  Total Bilirubin 0.3 - 1.2 mg/dL - 0.4 0.3  Alkaline Phos 38 - 126 U/L - 79 94  AST 15 - 41 U/L - 25 25  ALT 0 - 44 U/L - 18 19    Lab Results  Component Value Date   WBC 6.2 03/17/2019   HGB 12.7 03/17/2019   HCT 41.0 03/17/2019   MCV 88.7 03/17/2019   PLT 282 03/17/2019   NEUTROABS 3.1 02/09/2019    ASSESSMENT & PLAN:  Malignant neoplasm of upper-inner quadrant of left breast in female, estrogen receptor positive ([place]) 03/14/2019:Left lumpectomy [name]): IDC, 2.9cm, grade 3, high grade DCIS, lymphovascular invasion present, clear margins and 1/3 lymph nodes positive for carcinoma measuring 0.7cm.  ER 100%, PR 80%, Ki-67 20%, HER-2 negative T2N1 stage IIa MammaPrint: High risk luminal type B  Treatment plan: 1.    Adjuvant chemotherapy with dose dense Adriamycin and Cytoxan x4 followed by Taxol x12 2.  Followed by adjuvant radiation 3.  Followed by adjuvant antiestrogen therapy ----------------------------------------------------------- Chemotherapy Counseling: I discussed the risks and benefits of chemotherapy including the risks of nausea/ vomiting, risk of infection from low WBC count, fatigue due to chemo or anemia, bruising or bleeding due to low platelets, mouth  sores, loss/ change in taste and decreased appetite. Liver and kidney function will be monitored through out chemotherapy as abnormalities in liver and kidney function may be a side effect of treatment. Cardiac dysfunction due to Adriamycin was discussed in detail.  Risk of neuropathy from Taxol was discussed.  Risk of permanent bone marrow dysfunction and leukemia due to chemo were also discussed.  Plan: Echo, chemo class, port placement  UPBEAT clinical trial ([place]): Newly diagnosed stage I to III breast cancer patients receiving either adjuvant or neoadjuvant chemotherapy undergo cardiac MRI before treatment and at 24 months along with neurocognitive testing, exercise and disability measures at baseline 3, 12 and 24 months.  Return to clinic to start chemotherapy.    No orders of the defined types were placed in this encounter.  The patient has a good understanding of the overall plan. she agrees with it. she will call with any problems that may develop before the next visit here.  Nicholas Lose, MD 04/08/2019  Julious Oka Dorshimer am acting as scribe for Dr. Nicholas Lose.  I have reviewed the above documentation for accuracy and completeness, and I agree with the above.

## 2019-04-08 ENCOUNTER — Other Ambulatory Visit: Payer: Self-pay

## 2019-04-08 ENCOUNTER — Other Ambulatory Visit: Payer: Self-pay | Admitting: Hematology and Oncology

## 2019-04-08 ENCOUNTER — Inpatient Hospital Stay (HOSPITAL_BASED_OUTPATIENT_CLINIC_OR_DEPARTMENT_OTHER): Payer: Medicaid Other | Admitting: Hematology and Oncology

## 2019-04-08 ENCOUNTER — Encounter: Payer: Self-pay | Admitting: *Deleted

## 2019-04-08 DIAGNOSIS — Z170 Estrogen receptor positive status [ER+]: Secondary | ICD-10-CM | POA: Diagnosis not present

## 2019-04-08 DIAGNOSIS — C50212 Malignant neoplasm of upper-inner quadrant of left female breast: Secondary | ICD-10-CM

## 2019-04-08 MED ORDER — PROCHLORPERAZINE MALEATE 10 MG PO TABS: 10.0000 mg | ORAL_TABLET | Freq: Four times a day (QID) | ORAL | 1 refills | Status: DC | PRN

## 2019-04-08 MED ORDER — DEXAMETHASONE 4 MG PO TABS: ORAL_TABLET | ORAL | 0 refills | Status: DC

## 2019-04-08 MED ORDER — LORAZEPAM 0.5 MG PO TABS: 0.5000 mg | ORAL_TABLET | Freq: Every evening | ORAL | 0 refills | Status: DC | PRN

## 2019-04-08 MED ORDER — ONDANSETRON HCL 8 MG PO TABS: 8.0000 mg | ORAL_TABLET | Freq: Two times a day (BID) | ORAL | 1 refills | Status: DC | PRN

## 2019-04-08 MED ORDER — LIDOCAINE-PRILOCAINE 2.5-2.5 % EX CREA: TOPICAL_CREAM | CUTANEOUS | 3 refills | Status: DC

## 2019-04-08 NOTE — Research (Signed)
04/08/2019  Research - [id number] UPBEAT initial referral  Referral received from Dr. Lindi Adie today. Met with patient briefly to provide a copy of the [place] Consent Form for Group 1 of the "Understanding and Predicting Breast Cancer Events after Treatment" (UPBEAT) study, Protocol Version Date 04/20/18, [facility] Active Date 07/14/18. Patient was also given a copy of the Authorization form, along with the [place] brochure and my business card.  Patient had been given a brief overview of the study by Dr. Lindi Adie. Explained to patient that assessments are required prior to beginning chemotherapy, including cardiac MRI, blood work, questionnaires, neurocognitive and physical testing. Explained timing of assessments at baseline and months 3, 12 and 24. Patient states she is scheduled for port placement on 04/19/2019 with first cycle of chemo on 04/26/2019.   Patient's primary language is [place] and she was born in the [place]. She notes her last name is African (husband from [place]) and means "Friday". She goes by [facility]. Patient states that she is currently single with adult children and grandchildren. 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 She reports 3 prior tests for COVID-19, all negative, and is scheduled for pre-admission COVID-19 testing on 04/15/2019.   Patient is very interested in participating and is aware of the scheduling requirements. She understands the voluntary nature of the study. Patient is aware that a review of her records will be performed to determine eligibility for participation and that she will be contacted by a research nurse for further steps. Requested that patient review the consent and authorization forms over the weekend to prepare for study participation, if she wishes to proceed.   Cindy S. Brigitte Pulse BSN, RN, [place] 04/08/2019 10:16 AM

## 2019-04-08 NOTE — Assessment & Plan Note (Addendum)
03/14/2019:Left lumpectomy [name]): IDC, 2.9cm, grade 3, high grade DCIS, lymphovascular invasion present, clear margins and 1/3 lymph nodes positive for carcinoma measuring 0.7cm.  ER 100%, PR 80%, Ki-67 20%, HER-2 negative T2N1 stage IIa MammaPrint: High risk luminal type B  Treatment plan: 1.    Adjuvant chemotherapy with dose dense Adriamycin and Cytoxan x4 followed by Taxol x12 2.  Followed by adjuvant radiation 3.  Followed by adjuvant antiestrogen therapy ----------------------------------------------------------- Chemotherapy Counseling: I discussed the risks and benefits of chemotherapy including the risks of nausea/ vomiting, risk of infection from low WBC count, fatigue due to chemo or anemia, bruising or bleeding due to low platelets, mouth sores, loss/ change in taste and decreased appetite. Liver and kidney function will be monitored through out chemotherapy as abnormalities in liver and kidney function may be a side effect of treatment. Cardiac dysfunction due to Adriamycin was discussed in detail.  Risk of neuropathy from Taxol was discussed.  Risk of permanent bone marrow dysfunction and leukemia due to chemo were also discussed.  Plan: Echo, chemo class, port placement  UPBEAT clinical trial ([place]): Newly diagnosed stage I to III breast cancer patients receiving either adjuvant or neoadjuvant chemotherapy undergo cardiac MRI before treatment and at 24 months along with neurocognitive testing, exercise and disability measures at baseline 3, 12 and 24 months.  Return to clinic to start chemotherapy.

## 2019-04-08 NOTE — Progress Notes (Signed)
START ON PATHWAY REGIMEN - Breast   Dose-Dense AC q14 days:   A cycle is every 14 days:     Doxorubicin      Cyclophosphamide      [id number]   **Always confirm dose/schedule in your pharmacy ordering system**  Paclitaxel 80 mg/m2 Weekly:   Administer weekly:     Paclitaxel   **Always confirm dose/schedule in your pharmacy ordering system**  Patient Characteristics: Postoperative without Neoadjuvant Therapy (Pathologic Staging), Invasive Disease, Adjuvant Therapy, HER2 Negative/Unknown/Equivocal, ER Positive, Node Positive, Node Positive (1-3), MammaPrint(R) Ordered, High Genomic Risk Therapeutic Status: Postoperative without Neoadjuvant Therapy (Pathologic Staging) AJCC Grade: G3 AJCC N Category: pN1 AJCC M Category: cM0 ER Status: Positive (+) AJCC 8 Stage Grouping: IIA HER2 Status: Negative (-) Oncotype Dx Recurrence Score: Not Appropriate AJCC T Category: pT2 PR Status: Positive (+) Has this patient completed genomic testing<= Yes - MammaPrint(R) MammaPrint(R) Score: High Genomic Risk Intent of Therapy: Curative Intent, Discussed with Patient

## 2019-04-11 ENCOUNTER — Other Ambulatory Visit (HOSPITAL_COMMUNITY): Payer: Self-pay | Admitting: Hematology and Oncology

## 2019-04-11 ENCOUNTER — Telehealth: Payer: Self-pay | Admitting: *Deleted

## 2019-04-11 ENCOUNTER — Telehealth: Payer: Self-pay | Admitting: Medical Oncology

## 2019-04-11 ENCOUNTER — Telehealth: Payer: Self-pay | Admitting: Hematology and Oncology

## 2019-04-11 DIAGNOSIS — C50919 Malignant neoplasm of unspecified site of unspecified female breast: Secondary | ICD-10-CM

## 2019-04-11 NOTE — Telephone Encounter (Signed)
I talk with patient regarding schedule  

## 2019-04-11 NOTE — Telephone Encounter (Signed)
04/11/2019 Received telephone call from patient stating that she is interested in participatin in the UPBEAT study. As far as availability, she said as of now, this Wednesday after her pre-admission visit, or next Monday (day before surgery) would be good for her. Patient states the best way to reach her is by cell phone [phone] Gwynn Burly. Brigitte Pulse BSN, RN, [place] 04/11/2019 11:24 AM .

## 2019-04-11 NOTE — Telephone Encounter (Signed)
UPBEAT: Referral Patient was referred to study by Dr. Lindi Adie last week. Patient had called research nurse, Linda Spears and informed her that she was interested in participating in study.  I followed up with a phone call to patient to inquire her schedule and ask her about study Inclusion Criteria that are not found in patient's chart. Patient confirms interest in study and that she is able to hold her breath for 10 seconds, that she is able to walk at least two blocks without chest pain, dyspnea, shortness of breath or fainting, she is able to exercise on a treadmill or a stationary cycle. Patient also denies having symptomatic claustrophobia or having any metal implants that would interfere with an MRI. I inquired with patient if she had any questions and patient denied any. Patient was informed that study consent would need to be signed prior to any study assessments, patient gave verbal understanding. Patient stated she is available for the morning of Monday, August 24th.  Patient was scheduled for consent signing at 0700, with baseline cardiac MRI, with the completion of baseline assessments to follow.  Second nurse eligibility review pending.  Patient thanked for her time and encouraged to call with questions in the meantime.  Linda Marion, RN, BSN, [facility] Clinical Research 04/11/2019 4:26 PM

## 2019-04-12 NOTE — Patient Instructions (Addendum)
YOU NEED TO HAVE A COVID 19 TEST ON 04-15-2019 at 800 AM. THIS TEST MUST BE DONE BEFORE SURGERY, COME  [place], [place] , [address]. ONCE YOUR COVID TEST IS COMPLETED, PLEASE BEGIN THE QUARANTINE INSTRUCTIONS AS OUTLINED IN YOUR HANDOUT.                RENU ASBY    Your procedure is scheduled on: 04-19-2019  Report to [facility] Main  Entrance              Report to admitting at 1130 AM               [address] [place]. NO VISITORS ARE ALLOWED IN SHORT STAY OR RECOVERY ROOM.   Call this number if you have problems the morning of surgery [phone]    Remember: Do not eat food :After Midnight. CLEAR LIQUIDS FROM MIDNIGHT UNTIL 730 AM. NOTHING BY MOUTH AFTER 730 AM.               BRUSH YOUR TEETH MORNING OF SURGERY AND RINSE YOUR MOUTH OUT, NO CHEWING GUM CANDY OR MINTS.     CLEAR LIQUID DIET   Foods Allowed                                                                     Foods Excluded  Coffee and tea, regular and decaf                             liquids that you cannot  Plain Jell-O any favor except red or purple                                           see through such as: Fruit ices (not with fruit pulp)                                     milk, soups, orange juice  Iced Popsicles                                    All solid food Carbonated beverages, regular and diet                                    Cranberry, grape and apple juices Sports drinks like Gatorade Lightly seasoned clear broth or consume(fat free) Sugar, honey syrup  Sample Menu Breakfast                                Lunch  Supper Cranberry juice                    Beef broth                            Chicken broth Jell-O                                     Grape juice                           Apple juice Coffee or tea                        Jell-O                                       Popsicle                                                Coffee or tea                        Coffee or tea  _____________________________________________________________________     Take these medicines the morning of surgery with A SIP OF WATER: NONE                                You may not have any metal on your body including hair pins and              piercings  Do not wear jewelry, make-up, lotions, powders or perfumes, deodorant             Do not wear nail polish.  Do not shave  48 hours prior to surgery.                Do not bring valuables to the hospital. [place].  Contacts, dentures or bridgework may not be worn into surgery.  Leave suitcase in the car. After surgery it may be brought to your room.     Patients discharged the day of surgery will not be allowed to drive home. IF YOU ARE HAVING SURGERY AND GOING HOME THE SAME DAY, YOU MUST HAVE AN ADULT TO DRIVE YOU HOME AND BE WITH YOU FOR 24 HOURS. YOU MAY GO HOME BY TAXI OR UBER OR ORTHERWISE, BUT AN ADULT MUST ACCOMPANY YOU HOME AND STAY WITH YOU FOR 24 HOURS.  Name and phone number of your driver: daughter- Theodoro Grist               Please read over the following fact sheets you were given: _____________________________________________________________________             [facility] - Preparing for Surgery Before surgery, you can play an important role.  Because skin is not sterile, your skin needs to be as free of germs as possible.  You can reduce the number of  germs on your skin by washing with CHG (chlorahexidine gluconate) soap before surgery.  CHG is an antiseptic cleaner which kills germs and bonds with the skin to continue killing germs even after washing. Please DO NOT use if you have an allergy to CHG or antibacterial soaps.  If your skin becomes reddened/irritated stop using the CHG and inform your nurse when you arrive at Short Stay. Do not shave  (including legs and underarms) for at least 48 hours prior to the first CHG shower.  You may shave your face/neck. Please follow these instructions carefully:  1.  Shower with CHG Soap the night before surgery and the  morning of Surgery.  2.  If you choose to wash your hair, wash your hair first as usual with your  normal  shampoo.  3.  After you shampoo, rinse your hair and body thoroughly to remove the  shampoo.                           4.  Use CHG as you would any other liquid soap.  You can apply chg directly  to the skin and wash                       Gently with a scrungie or clean washcloth.  5.  Apply the CHG Soap to your body ONLY FROM THE NECK DOWN.   Do not use on face/ open                           Wound or open sores. Avoid contact with eyes, ears mouth and genitals (private parts).                       Wash face,  Genitals (private parts) with your normal soap.             6.  Wash thoroughly, paying special attention to the area where your surgery  will be performed.  7.  Thoroughly rinse your body with warm water from the neck down.  8.  DO NOT shower/wash with your normal soap after using and rinsing off  the CHG Soap.                9.  Pat yourself dry with a clean towel.            10.  Wear clean pajamas.            11.  Place clean sheets on your bed the night of your first shower and do not  sleep with pets. Day of Surgery : Do not apply any lotions/deodorants the morning of surgery.  Please wear clean clothes to the hospital/surgery center.  FAILURE TO FOLLOW THESE INSTRUCTIONS MAY RESULT IN THE CANCELLATION OF YOUR SURGERY PATIENT SIGNATURE_________________________________  NURSE SIGNATURE__________________________________  ________________________________________________________________________

## 2019-04-13 ENCOUNTER — Encounter (HOSPITAL_COMMUNITY): Payer: Self-pay

## 2019-04-13 ENCOUNTER — Other Ambulatory Visit: Payer: Self-pay

## 2019-04-13 ENCOUNTER — Encounter (HOSPITAL_COMMUNITY)
Admission: RE | Admit: 2019-04-13 | Discharge: 2019-04-13 | Disposition: A | Discharge status: Home / Self Care | Payer: Medicaid Other | Source: Ambulatory Visit | Attending: Surgery | Admitting: Surgery

## 2019-04-13 DIAGNOSIS — C50912 Malignant neoplasm of unspecified site of left female breast: Secondary | ICD-10-CM | POA: Diagnosis not present

## 2019-04-13 DIAGNOSIS — Z01812 Encounter for preprocedural laboratory examination: Secondary | ICD-10-CM | POA: Insufficient documentation

## 2019-04-13 LAB — CBC
HCT: 42 % (ref 36.0–46.0)
Hemoglobin: 12.8 g/dL (ref 12.0–15.0)
MCH: 27.2 pg (ref 26.0–34.0)
MCHC: 30.5 g/dL (ref 30.0–36.0)
MCV: 89.4 fL (ref 80.0–100.0)
Platelets: 323 10*3/uL (ref 150–400)
RBC: 4.7 MIL/uL (ref 3.87–5.11)
RDW: 14.2 % (ref 11.5–15.5)
WBC: 5.9 10*3/uL (ref 4.0–10.5)
nRBC: 0 % (ref 0.0–0.2)

## 2019-04-15 ENCOUNTER — Telehealth: Payer: Self-pay | Admitting: Medical Oncology

## 2019-04-15 ENCOUNTER — Other Ambulatory Visit: Payer: Self-pay

## 2019-04-15 ENCOUNTER — Inpatient Hospital Stay: Payer: Medicaid Other

## 2019-04-15 ENCOUNTER — Other Ambulatory Visit (HOSPITAL_COMMUNITY)
Admission: RE | Admit: 2019-04-15 | Discharge: 2019-04-15 | Disposition: A | Discharge status: Home / Self Care | Payer: Medicaid Other | Source: Ambulatory Visit | Attending: Surgery | Admitting: Surgery

## 2019-04-15 ENCOUNTER — Other Ambulatory Visit (HOSPITAL_COMMUNITY): Payer: Medicaid Other

## 2019-04-15 ENCOUNTER — Ambulatory Visit (HOSPITAL_COMMUNITY)
Admission: RE | Admit: 2019-04-15 | Discharge: 2019-04-15 | Disposition: A | Discharge status: Home / Self Care | Payer: Medicaid Other | Source: Ambulatory Visit | Attending: Hematology and Oncology | Admitting: Hematology and Oncology

## 2019-04-15 ENCOUNTER — Encounter: Payer: Self-pay | Admitting: Hematology and Oncology

## 2019-04-15 DIAGNOSIS — I081 Rheumatic disorders of both mitral and tricuspid valves: Secondary | ICD-10-CM | POA: Diagnosis not present

## 2019-04-15 DIAGNOSIS — Z01812 Encounter for preprocedural laboratory examination: Secondary | ICD-10-CM | POA: Insufficient documentation

## 2019-04-15 DIAGNOSIS — C50212 Malignant neoplasm of upper-inner quadrant of left female breast: Secondary | ICD-10-CM

## 2019-04-15 DIAGNOSIS — Z170 Estrogen receptor positive status [ER+]: Secondary | ICD-10-CM

## 2019-04-15 DIAGNOSIS — I10 Essential (primary) hypertension: Secondary | ICD-10-CM | POA: Insufficient documentation

## 2019-04-15 DIAGNOSIS — Z20828 Contact with and (suspected) exposure to other viral communicable diseases: Secondary | ICD-10-CM | POA: Diagnosis not present

## 2019-04-15 LAB — SARS CORONAVIRUS 2 (TAT 6-24 HRS): SARS Coronavirus 2: NEGATIVE

## 2019-04-15 NOTE — Progress Notes (Signed)
Called pt to introduce myself as her Arboriculturist.  Pt's treatment should be paid at 100% so copay assistance isn't needed.  I left a vm regarding the [employer] and requested she return my call if she's interested in applying for the grant.

## 2019-04-15 NOTE — Progress Notes (Signed)
  Echocardiogram 2D Echocardiogram has been performed.  Linda Spears 04/15/2019, 9:41 AM

## 2019-04-15 NOTE — Telephone Encounter (Signed)
UPBEAT: Referral I spoke with patient and confirmed her Monday morning, 8/24 @ 0700 research consent appointment, with baseline assessments to follow. Patient denies questions at this time. Patient thanked.  Maxwell Marion, RN, BSN, [facility] Clinical Research 04/15/2019 4:33 PM

## 2019-04-18 ENCOUNTER — Ambulatory Visit (HOSPITAL_COMMUNITY): Admission: RE | Admit: 2019-04-18 | Payer: Medicaid Other | Source: Ambulatory Visit

## 2019-04-18 ENCOUNTER — Other Ambulatory Visit: Payer: Self-pay | Admitting: Hematology and Oncology

## 2019-04-18 ENCOUNTER — Other Ambulatory Visit (HOSPITAL_COMMUNITY): Payer: Medicaid Other

## 2019-04-18 ENCOUNTER — Inpatient Hospital Stay: Payer: Medicaid Other | Admitting: *Deleted

## 2019-04-18 ENCOUNTER — Ambulatory Visit: Payer: Medicaid Other

## 2019-04-18 ENCOUNTER — Encounter: Payer: Self-pay | Admitting: Hematology and Oncology

## 2019-04-18 VITALS — BP 149/78 | HR 54 | Ht 62.0 in | Wt 123.0 lb

## 2019-04-18 DIAGNOSIS — Z006 Encounter for examination for normal comparison and control in clinical research program: Secondary | ICD-10-CM

## 2019-04-18 NOTE — Progress Notes (Signed)
On pro has been added to her treatment plan.  I was informed that no prior authorization was needed. Patient has very challenging transportation issues and this would help it very much.

## 2019-04-18 NOTE — Research (Signed)
CONSENT AND BASELINE ACTIVITIES FOR UPBEAT [id number]) STUDY; Patient into clinic this morningby herselfto consent and complete thebaseline study activities for Upbeat. Consent: Completed prior to any study activities. Patient reports she read the consent form at home and wants to enroll on this study. Spent 30 minutes reviewing consent form for protocol version date 04/20/18, [place] active date 07/14/18 along with hippa form dated 01/09/16 in their entirety with patient. Discussion included voluntary nature of this study and possible risks and benefits of participation along with all the required study activities and time points. Patient denied any questions and she signed and dated both forms where indicated. Patient also agreed to the optional use of her blood for future research. Copies of the consent and hippa forms given to patient for her records.  Patient meets all eligibility requirements to be enrolled in the Upbeat study and Dr. Lindi Adie agrees patient is eligible. Patient denies any contraindications for having a MRI performed per protocol. She also denies any history of the exclusion criteria listed in protocol. Patient states she can walk on a treadmill and 2 blocks without any problems such as dyspnea or chest pains. Registered in [place]; Patient ID [id number].  PROs:Completed by patient in clinic and returned to Research nurse.  Reviewed for completeness and accuracy. Research nurse reviewed section F of the questionnaire booklet,Depressive Symptom Screen, and patient scored a "0", which does not requireany reporting orfollow up per protocol. VS, Ht & [id number] nurse obtainedheightandweight without shoes and without heavy clothingper protocol. Blood pressure and heart rate also obtained per protocol after allowing patient to sit/rest for 5 minutesand 1 minute rest between readings. BMI calculated using NIH BMI calculatorwebsite provided by study =22.5.  Waist Measurement:  Waist measured per instructions on CRF 13 = 32.5 in. Concomitant Medication Review: Reviewed current medication list with patient.She has not startedthenew Rxs for Decadron,Ativan, EMLA , Zofran orCompazine yet.These were prescribed to start with chemotherapy which is scheduled to start on9/1/20. Neurocognitive Testing:Neurocognitive testing wasadministeredby researchspecialist Remer Macho. Physical Functions Testing:Patientcompleted physical functions testing which included 6-Minute Walk Test, Disability Measures, and SPPB without difficulty. CardiacMRI:Cardiac MRI was scheduled for this morning and they had to reschedule to later in the morning but patient could not wait due to transportation issues.  Patient has number for MRI and will call to try to get rescheduled before she starts chemotherapy next week.  Research Labs: Will be collected tomorrow morning prior to patient's surgery for [facility] a Cath. Patient will be fasting for surgery and she did not want to fast prior to her first chemotherapy.    Thanked patient again for her participation and encouraged her to call research nurse if any questions prior to next visit.She verbalized understanding. Foye Spurling, BSN, RN Clinical Research Nurse 04/18/2019 11:21 AM

## 2019-04-18 NOTE — Progress Notes (Signed)
Pt returned my call and would like to apply for the [employer].  She's not working right now so she will provide a letter of support on 04/26/19.  Once approved I will give her an expense sheet and my card for any questions or concerns she may have in the future.

## 2019-04-19 ENCOUNTER — Ambulatory Visit (HOSPITAL_COMMUNITY)
Admission: RE | Admit: 2019-04-19 | Discharge: 2019-04-19 | Disposition: A | Discharge status: Home / Self Care | Payer: Medicaid Other | Attending: Surgery | Admitting: Surgery

## 2019-04-19 ENCOUNTER — Ambulatory Visit (HOSPITAL_COMMUNITY): Payer: Medicaid Other | Admitting: Registered Nurse

## 2019-04-19 ENCOUNTER — Other Ambulatory Visit: Payer: Medicaid Other

## 2019-04-19 ENCOUNTER — Encounter (HOSPITAL_COMMUNITY)
Admission: RE | Disposition: A | Discharge status: Home / Self Care | Payer: Self-pay | Source: Home / Self Care | Attending: Surgery

## 2019-04-19 ENCOUNTER — Other Ambulatory Visit: Payer: Self-pay | Admitting: *Deleted

## 2019-04-19 ENCOUNTER — Ambulatory Visit (HOSPITAL_COMMUNITY): Payer: Medicaid Other

## 2019-04-19 ENCOUNTER — Telehealth: Payer: Self-pay | Admitting: *Deleted

## 2019-04-19 ENCOUNTER — Ambulatory Visit (HOSPITAL_COMMUNITY): Payer: Medicaid Other | Admitting: Physician Assistant

## 2019-04-19 ENCOUNTER — Encounter (HOSPITAL_COMMUNITY): Payer: Self-pay

## 2019-04-19 ENCOUNTER — Telehealth (HOSPITAL_COMMUNITY): Payer: Self-pay | Admitting: *Deleted

## 2019-04-19 ENCOUNTER — Other Ambulatory Visit: Payer: Self-pay

## 2019-04-19 DIAGNOSIS — C50912 Malignant neoplasm of unspecified site of left female breast: Secondary | ICD-10-CM | POA: Insufficient documentation

## 2019-04-19 DIAGNOSIS — F17210 Nicotine dependence, cigarettes, uncomplicated: Secondary | ICD-10-CM | POA: Insufficient documentation

## 2019-04-19 DIAGNOSIS — Z95828 Presence of other vascular implants and grafts: Secondary | ICD-10-CM

## 2019-04-19 HISTORY — PX: PORTACATH PLACEMENT: SHX2246

## 2019-04-19 SURGERY — INSERTION, TUNNELED CENTRAL VENOUS DEVICE, WITH PORT
Anesthesia: General | Site: Chest

## 2019-04-19 MED ORDER — LACTATED RINGERS IV SOLN
INTRAVENOUS | Status: DC
  Administered 2019-04-19: 11:00:00 via INTRAVENOUS

## 2019-04-19 MED ORDER — ONDANSETRON HCL 4 MG/2ML IJ SOLN: 4.0000 mg | Freq: Once | INTRAMUSCULAR | Status: DC | PRN

## 2019-04-19 MED ORDER — FENTANYL CITRATE (PF) 100 MCG/2ML IJ SOLN
INTRAMUSCULAR | Status: AC
  Filled 2019-04-19: qty 2

## 2019-04-19 MED ORDER — PROPOFOL 10 MG/ML IV BOLUS
INTRAVENOUS | Status: AC
  Filled 2019-04-19: qty 20

## 2019-04-19 MED ORDER — HYDROMORPHONE HCL 1 MG/ML IJ SOLN: 0.2500 mg | INTRAMUSCULAR | Status: DC | PRN

## 2019-04-19 MED ORDER — ACETAMINOPHEN 500 MG PO TABS
ORAL_TABLET | ORAL | Status: AC
  Filled 2019-04-19: qty 2

## 2019-04-19 MED ORDER — ONDANSETRON HCL 4 MG/2ML IJ SOLN
INTRAMUSCULAR | Status: DC | PRN
  Administered 2019-04-19: 4 mg via INTRAVENOUS

## 2019-04-19 MED ORDER — FENTANYL CITRATE (PF) 100 MCG/2ML IJ SOLN
INTRAMUSCULAR | Status: DC | PRN
  Administered 2019-04-19: 50 ug via INTRAVENOUS

## 2019-04-19 MED ORDER — CHLORHEXIDINE GLUCONATE CLOTH 2 % EX PADS: 6.0000 | MEDICATED_PAD | Freq: Once | CUTANEOUS | Status: DC

## 2019-04-19 MED ORDER — DEXAMETHASONE SODIUM PHOSPHATE 10 MG/ML IJ SOLN
INTRAMUSCULAR | Status: AC
  Filled 2019-04-19: qty 1

## 2019-04-19 MED ORDER — EPHEDRINE SULFATE-NACL 50-0.9 MG/10ML-% IV SOSY
PREFILLED_SYRINGE | INTRAVENOUS | Status: DC | PRN
  Administered 2019-04-19 (×4): 5 mg via INTRAVENOUS

## 2019-04-19 MED ORDER — BUPIVACAINE-EPINEPHRINE (PF) 0.25% -1:200000 IJ SOLN
INTRAMUSCULAR | Status: AC
  Filled 2019-04-19: qty 30

## 2019-04-19 MED ORDER — ACETAMINOPHEN 10 MG/ML IV SOLN: 1000.0000 mg | Freq: Once | INTRAVENOUS | Status: DC | PRN

## 2019-04-19 MED ORDER — LIDOCAINE HCL (PF) 1 % IJ SOLN
INTRAMUSCULAR | Status: AC
  Filled 2019-04-19: qty 30

## 2019-04-19 MED ORDER — CEFAZOLIN SODIUM-DEXTROSE 2-4 GM/100ML-% IV SOLN
INTRAVENOUS | Status: AC
  Filled 2019-04-19: qty 100

## 2019-04-19 MED ORDER — DEXAMETHASONE SODIUM PHOSPHATE 10 MG/ML IJ SOLN
INTRAMUSCULAR | Status: DC | PRN
  Administered 2019-04-19: 8 mg via INTRAVENOUS

## 2019-04-19 MED ORDER — GABAPENTIN 300 MG PO CAPS
300.0000 mg | ORAL_CAPSULE | ORAL | Status: AC
  Administered 2019-04-19: 300 mg via ORAL

## 2019-04-19 MED ORDER — MIDAZOLAM HCL 5 MG/5ML IJ SOLN
INTRAMUSCULAR | Status: DC | PRN
  Administered 2019-04-19: 2 mg via INTRAVENOUS

## 2019-04-19 MED ORDER — LIDOCAINE 2% (20 MG/ML) 5 ML SYRINGE
INTRAMUSCULAR | Status: AC
  Filled 2019-04-19: qty 5

## 2019-04-19 MED ORDER — MEPERIDINE HCL 50 MG/ML IJ SOLN: 6.2500 mg | INTRAMUSCULAR | Status: DC | PRN

## 2019-04-19 MED ORDER — MIDAZOLAM HCL 2 MG/2ML IJ SOLN
INTRAMUSCULAR | Status: AC
  Filled 2019-04-19: qty 2

## 2019-04-19 MED ORDER — CEFAZOLIN SODIUM-DEXTROSE 2-4 GM/100ML-% IV SOLN
2.0000 g | INTRAVENOUS | Status: AC
  Administered 2019-04-19: 2 g via INTRAVENOUS

## 2019-04-19 MED ORDER — HYDROCODONE-ACETAMINOPHEN 5-325 MG PO TABS: 1.0000 | ORAL_TABLET | Freq: Four times a day (QID) | ORAL | 0 refills | Status: DC | PRN

## 2019-04-19 MED ORDER — ACETAMINOPHEN 500 MG PO TABS
1000.0000 mg | ORAL_TABLET | ORAL | Status: AC
  Administered 2019-04-19: 11:00:00 1000 mg via ORAL

## 2019-04-19 MED ORDER — ONDANSETRON HCL 4 MG/2ML IJ SOLN
INTRAMUSCULAR | Status: AC
  Filled 2019-04-19: qty 2

## 2019-04-19 MED ORDER — HYDROCODONE-ACETAMINOPHEN 7.5-325 MG PO TABS: 1.0000 | ORAL_TABLET | Freq: Once | ORAL | Status: DC | PRN

## 2019-04-19 MED ORDER — SODIUM CHLORIDE 0.9 % IV SOLN
Freq: Once | INTRAVENOUS | Status: AC
  Administered 2019-04-19: 500 mL
  Filled 2019-04-19: qty 1.2

## 2019-04-19 MED ORDER — LIDOCAINE 2% (20 MG/ML) 5 ML SYRINGE
INTRAMUSCULAR | Status: DC | PRN
  Administered 2019-04-19: 60 mg via INTRAVENOUS

## 2019-04-19 MED ORDER — BUPIVACAINE-EPINEPHRINE (PF) 0.25% -1:200000 IJ SOLN
INTRAMUSCULAR | Status: DC | PRN
  Administered 2019-04-19: 10 mL

## 2019-04-19 MED ORDER — HEPARIN SOD (PORK) LOCK FLUSH 100 UNIT/ML IV SOLN
INTRAVENOUS | Status: DC | PRN
  Administered 2019-04-19: 500 [IU]

## 2019-04-19 MED ORDER — PROPOFOL 10 MG/ML IV BOLUS
INTRAVENOUS | Status: DC | PRN
  Administered 2019-04-19: 120 mg via INTRAVENOUS

## 2019-04-19 MED ORDER — GABAPENTIN 300 MG PO CAPS
ORAL_CAPSULE | ORAL | Status: AC
  Filled 2019-04-19: qty 1

## 2019-04-19 MED ORDER — HEPARIN SOD (PORK) LOCK FLUSH 100 UNIT/ML IV SOLN
INTRAVENOUS | Status: AC
  Filled 2019-04-19: qty 5

## 2019-04-19 SURGICAL SUPPLY — 36 items
ADH SKN CLS APL DERMABOND .7 (GAUZE/BANDAGES/DRESSINGS) ×1
APL PRP STRL LF DISP 70% ISPRP (MISCELLANEOUS) ×1
APL SKNCLS STERI-STRIP NONHPOA (GAUZE/BANDAGES/DRESSINGS)
BAG DECANTER FOR FLEXI CONT (MISCELLANEOUS) ×2 IMPLANT
BENZOIN TINCTURE PRP APPL 2/3 (GAUZE/BANDAGES/DRESSINGS) IMPLANT
BLADE SURG 15 STRL LF DISP TIS (BLADE) ×1 IMPLANT
BLADE SURG 15 STRL SS (BLADE) ×2
CHLORAPREP W/TINT 26 (MISCELLANEOUS) ×2 IMPLANT
COVER PROBE U/S 5X48 (MISCELLANEOUS) ×1 IMPLANT
COVER SURGICAL LIGHT HANDLE (MISCELLANEOUS) ×2 IMPLANT
COVER WAND RF STERILE (DRAPES) IMPLANT
DECANTER SPIKE VIAL GLASS SM (MISCELLANEOUS) ×2 IMPLANT
DERMABOND ADVANCED (GAUZE/BANDAGES/DRESSINGS) ×1
DERMABOND ADVANCED .7 DNX12 (GAUZE/BANDAGES/DRESSINGS) IMPLANT
DRAPE C-ARM 42X120 X-RAY (DRAPES) ×2 IMPLANT
DRAPE LAPAROSCOPIC ABDOMINAL (DRAPES) ×2 IMPLANT
ELECT PENCIL ROCKER SW 15FT (MISCELLANEOUS) ×2 IMPLANT
ELECT REM PT RETURN 15FT ADLT (MISCELLANEOUS) ×2 IMPLANT
GAUZE 4X4 16PLY RFD (DISPOSABLE) ×3 IMPLANT
GAUZE SPONGE 2X2 8PLY STRL LF (GAUZE/BANDAGES/DRESSINGS) IMPLANT
GLOVE SURG SYN 7.5  E (GLOVE) ×1
GLOVE SURG SYN 7.5 E (GLOVE) ×1 IMPLANT
GLOVE SURG SYN 7.5 PF PI (GLOVE) ×1 IMPLANT
GOWN STRL REUS W/TWL XL LVL3 (GOWN DISPOSABLE) ×4 IMPLANT
KIT BASIN OR (CUSTOM PROCEDURE TRAY) ×2 IMPLANT
KIT TURNOVER KIT A (KITS) ×1 IMPLANT
NDL HYPO 25X1 1.5 SAFETY (NEEDLE) ×1 IMPLANT
NEEDLE HYPO 25X1 1.5 SAFETY (NEEDLE) ×2 IMPLANT
PACK BASIC VI WITH GOWN DISP (CUSTOM PROCEDURE TRAY) ×2 IMPLANT
SPONGE GAUZE 2X2 STER 10/PKG (GAUZE/BANDAGES/DRESSINGS)
SUT MNCRL AB 4-0 PS2 18 (SUTURE) ×2 IMPLANT
SUT VIC AB 3-0 SH 18 (SUTURE) ×2 IMPLANT
SYR 10ML ECCENTRIC (SYRINGE) ×2 IMPLANT
SYR CONTROL 10ML LL (SYRINGE) ×2 IMPLANT
TOWEL OR 17X26 10 PK STRL BLUE (TOWEL DISPOSABLE) ×2 IMPLANT
TOWEL OR NON WOVEN STRL DISP B (DISPOSABLE) ×2 IMPLANT

## 2019-04-19 NOTE — Anesthesia Postprocedure Evaluation (Signed)
Anesthesia Post Note  Patient: Linda Spears  Procedure(s) Performed: INSERTION PORT-A-CATH WITH ULTRASOUND (N/A Chest)     Patient location during evaluation: PACU Anesthesia Type: General Level of consciousness: awake and alert Pain management: pain level controlled Vital Signs Assessment: post-procedure vital signs reviewed and stable Respiratory status: spontaneous breathing, nonlabored ventilation, respiratory function stable and patient connected to nasal cannula oxygen Cardiovascular status: blood pressure returned to baseline and stable Postop Assessment: no apparent nausea or vomiting Anesthetic complications: no    Last Vitals:  Vitals:   04/19/19 1041  BP: (!) 176/81  Pulse: 71  Temp: 36.9 C  SpO2: 100%    Last Pain:  Vitals:   04/19/19 1104  TempSrc:   PainSc: 0-No pain                 Barnet Glasgow

## 2019-04-19 NOTE — H&P (Signed)
Linda Spears  Location: [facility] Surgery Patient #: [id number] DOB: March 17, 1961 Single / Language: Linda Spears / Race: Black or African American Female  History of Present Illness   The patient is a 58 year old female who presents with a complaint of breast cancer.  The PCP is Dr. Norma Fredrickson (? she is unsure of the name, he is at [facility]) The patient was seen at the Breast [facility] - Oncology is Drs. Lindi Adie and Kinard  She is by herself. She did not want to try to call anyone on the phone.  [The Covid-19 virus has disrupted normal medical care in [place] and across the nation. We have sometimes had to alter normal surgical/medical care to limit this epidemic and we have explained these changes to the patient.]   She underwent a left breast lumpectomy (2 seeds) with left axillary SLNBx - 03/22/2019 - IDC, Grade 3, 2.8 cm, 1/3 nodes POSITIVE. Her mammoprint is high risk. I gave her a copy of all her reports. She is to see Dr. Lindi Adie this Friday, August 14. He is planning chemotherapy. I discussed the indications and potential complications of the power port placement. The primary complications of the power port, include, but are not limited to, bleeding, infection, nerve injury, thrombosis, and pneumothorax.  History of left breast cancer: She had never had a mammogram before. She noticed a mass in her left breast around November, 2019. She got sick in December, 2019, which knocked her out for several days. She then tried to get a mammogram, but the Coronavirus shut down happened. So now she is here.  [facility] - 1. Highly suspicious left breast mass corresponding with the patient's palpable lump. At 9 o'clock and measures 3.0 x 2.8 x 2.0 cm 2. No suspicious left axillary lymphadenopathy. 3. No mammographic evidence of malignancy on the right. Breast  density "c" Biopsy: Left breast biopsy at 9 o'clock on 02/02/2019 - [id number] - IDC, grade 3, ER - 100%, PR - 80%, Ki67 - 20%, Her2Neu - negative Family history of breast or ovarian cancer: No On hormone therapy: No  Plan: 1) Mammoprint is high risk, 2) Power port placement  Past Medical History: 1. Left breast cancer Left breast biopsy at 9 o'clock on 02/02/2019 - [id number] - IDC, grade 3, ER - 100%, PR - 80%, Ki67 - 20%, Her2Neu - negative Left breast lumpectomy (2 seeds) with left axillary SLNBx - 03/22/2019 ([id number])- IDC, Grade 3, 2.8 cm, 1/3 nodes POSITIVE Adjuvant chemotx 2. Discordant area of left breast - UIQ - removed at surgery - negative 3. Smokes, trying to quit 4. She is following her BP  Social History: Unmarried. 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 She moved here from [place] about 2012.   Allergies Emeline Gins, [place]; 04/06/2019 9:07 AM) No Known Allergies  [02/07/2019]: Allergies Reconciled   Medication History Emeline Gins, CMA; 04/06/2019 9:08 AM) Tylenol (325MG Tablet, Oral) Active. Multiple Vitamin (1 (one) Oral) Active. Medications Reconciled  Vitals Emeline Gins CMA; 04/06/2019 9:07 AM) 04/06/2019 9:07 AM Weight: 122.2 lb Height: 62in Body Surface Area: 1.55 m Body Mass Index: 22.35 kg/m  Temp.: 91F  Pulse: 60 (Regular)  BP: 130/78(Sitting, Left Arm, Standard)   Physical Exam  General: thin older AA F who is alert and generally healthy appearing. She is wearing a mask. Skin: Inspection and palpation of the skin unremarkable.  Eyes: Conjunctivae  white, pupils equal. Face, ears, nose, mouth, and throat: Face - normal. Normal ears and nose. Lips and teeth normal.  Neck: Supple. No mass. Trachea midline. No thyroid mass.  Lymph Nodes: No supraclavicular or cervical adenopathy. No axillary  adenopathy.  Breasts: Right - small breast, no mass Left - medial/circumareolar incision and left axillary incision looks good.  Musculoskeletal/extremities: She is doing pretty well with her left arm movement   Assessment & Plan Shanon Brow H. [name] MD; 04/06/2019 9:27 AM) 1.  BREAST CANCER, STAGE 2, LEFT (C50.912)  Biopsy: Left breast biopsy at 9 o'clock on 02/02/2019 - [id number] - IDC, grade 3, ER - 100%, PR - 80%, Ki67 - 20%, Her2Neu - negative  Left breast lumpectomy (2 seeds) with left axillary SLNBx - 03/22/2019 ([id number])- IDC, Grade 3, 2.8 cm, 1/3 nodes POSITIVE  Her Mammoprint score is high  Oncology - [place] [facility] [place]:   1) Power port placement   2) Chemotherapy   3) Radiation tx  2. Smokes, trying to quit   Alphonsa Overall, MD, [facility] Surgery Pager: [phone] Office phone:  [phone]

## 2019-04-19 NOTE — Op Note (Signed)
04/19/2019  1:55 PM  PATIENT:  Linda Spears, 58 y.o., female MRN: [id number] DOB: 27-Aug-1960  PREOP DIAGNOSIS:  LEFT BREAST CANCER, anticipate chemotherapy  POSTOP DIAGNOSIS:   Left breast caner, anticipate chemotherapy  PROCEDURE:   Procedure(s):  INSERTION PORT-A-CATH WITH ULTRASOUND in the right internal jugular  SURGEON:   Alphonsa Overall, M.D.  ANESTHESIA:   general  CRNA: Victoriano Lain, CRNA  General  EBL:  minimal  ml  COUNTS CORRECT:  YES  INDICATIONS FOR PROCEDURE:  Linda Spears is a 58 y.o. (DOB: Dec 26, 1960) AA female whose primary care physician is Clent Demark, PA-C and comes for power port placement for the treatment of left breast cancer.  Dr. Lindi Adie is her treating oncologist.   The indications and risks of the surgery were explained to the patient.  The risks include, but are not limited to, infection, bleeding, pneumothorax, nerve injury, and thrombosis of the vein.  OPERATIVE NOTE:  The patient was taken to OR room #5 at [facility].  Anesthesia was provided by CRNA: Victoriano Lain, CRNA.  At the beginning of the operation, the patient was given 2 gm Ancef, had a roll placed under her back, and had the upper chest/neck prepped with Chloroprep and draped.   A time out was held and the surgery checklist reviewed.   The patient was placed in Trendelenburg position.  The right internal jugular vein was imaged with an [place].  I stuck the vein twice, but could not get the wire to feed.  I stuck the artery twice and immediately withdrew the needle and placed pressure.  On the 5th stick, I accessed the right internal jugular vein with a 16 gauge needle and a guide wire threaded through the needle into the vein.  The position of the wire was checked with fluoroscopy.   I then developed a pocket in the upper inner aspect of the right chest for the port reservoir.  I used the [employer] for venous access.  The reservoir was sewn in place with a  3-0 Vicryl suture.  The reservoir had been flushed with dilute (10 units/cc) heparin.   I then passed the silastic tubing from the reservoir incision to the subclavian stick site and used the 8 French introducer to pass it into the vein.  The tip of the silastic catheter was position at the junction of the SVC and the right atrium under fluoroscopy.  The silastic catheter was then attached to the port with the bayonet device.     The entire port and tubing were checked with fluoroscopy and then the port was flushed with 4 cc of concentrated heparin (100 units/cc).  I injected 15 cc of 1/4% marcaine in the wounds.   The wounds were then closed with 3-0 vicryl subcutaneous sutures and the skin closed with a 4-0 Monocryl suture.  The skin was painted with DermaBond.   The patient was transferred to the recovery room in good condition.  The sponge and needle count were correct at the end of the case.  A CXR is ordered for port placement and pending at the time of this note.  Alphonsa Overall, MD, [facility] Surgery Pager: [phone] Office phone:  [phone]

## 2019-04-19 NOTE — Telephone Encounter (Signed)
Patient called to reschedule her lab appointment today to next week.  She was called to come in for her surgery today earlier than planned. Patient states she can come for lab on Monday 8/31 prior to MRI if she can get transportation.  Informed patient we will reschedule her lab appointment to Monday and we are working on transportation.  Plan to call her tomorrow with update.  Patient verbalized understanding. Notified Kim in lab of cancellation today.  Foye Spurling, BSN, RN Clinical Research Nurse 04/19/2019 9:22 AM

## 2019-04-19 NOTE — Anesthesia Procedure Notes (Signed)
Procedure Name: LMA Insertion Date/Time: 04/19/2019 12:38 PM Performed by: Victoriano Lain, CRNA Pre-anesthesia Checklist: Patient identified, Emergency Drugs available, Suction available, Patient being monitored and Timeout performed Patient Re-evaluated:Patient Re-evaluated prior to induction Oxygen Delivery Method: Circle system utilized Preoxygenation: Pre-oxygenation with 100% oxygen Induction Type: IV induction LMA: LMA with gastric port inserted LMA Size: 4.0 Number of attempts: 1 Placement Confirmation: positive ETCO2 and breath sounds checked- equal and bilateral Tube secured with: Tape Dental Injury: Teeth and Oropharynx as per pre-operative assessment

## 2019-04-19 NOTE — Discharge Instructions (Signed)
[facility] - DISCHARGE INSTRUCTIONS TO PATIENT  Activity:  Driving - May drive in one to three days, if doing well and off pain meds.   Lifting - No heavy lifting x 5 days, then no limit                       Practice you Covid-19 protection:  Wear a mask, social distance, and wash your hands frequently  Wound Care:   Leave the incision dry for 2 days, then you may shower  Diet:  As tolerated  Follow up appointment:  Call Dr. Pollie Friar office [facility]) at [phone] for an appointment in 6 months.  Medications and dosages:  Resume your home medications.  You have a prescription for:  vicodin  Call Dr. [name] or his office  [phone]) if you have:  Temperature greater than 100.4,  Severe uncontrolled pain,  Redness, tenderness, or signs of infection (pain, swelling, redness, odor or green/yellow discharge around the site),  Difficulty breathing, headache or visual disturbances,  Any other questions or concerns you may have after discharge.  In an emergency, call 911 or go to an Emergency Department at a nearby hospital.    General Anesthesia, Adult, Care After This sheet gives you information about how to care for yourself after your procedure. Your health care provider may also give you more specific instructions. If you have problems or questions, contact your health care provider. What can I expect after the procedure? After the procedure, the following side effects are common:  Pain or discomfort at the IV site.  Nausea.  Vomiting.  Sore throat.  Trouble concentrating.  Feeling cold or chills.  Weak or tired.  Sleepiness and fatigue.  Soreness and body aches. These side effects can affect parts of the body that were not involved in surgery. Follow these instructions at home:  For at least 24 hours after the procedure:  Have a responsible adult stay with you. It is important to have someone help care for you until you are  awake and alert.  Rest as needed.  Do not: ? Participate in activities in which you could fall or become injured. ? Drive. ? Use heavy machinery. ? Drink alcohol. ? Take sleeping pills or medicines that cause drowsiness. ? Make important decisions or sign legal documents. ? Take care of children on your own. Eating and drinking  Follow any instructions from your health care provider about eating or drinking restrictions.  When you feel hungry, start by eating small amounts of foods that are soft and easy to digest (bland), such as toast. Gradually return to your regular diet.  Drink enough fluid to keep your urine pale yellow.  If you vomit, rehydrate by drinking water, juice, or clear broth. General instructions  If you have sleep apnea, surgery and certain medicines can increase your risk for breathing problems. Follow instructions from your health care provider about wearing your sleep device: ? Anytime you are sleeping, including during daytime naps. ? While taking prescription pain medicines, sleeping medicines, or medicines that make you drowsy.  Return to your normal activities as told by your health care provider. Ask your health care provider what activities are safe for you.  Take over-the-counter and prescription medicines only as told by your health care provider.  If you smoke, do not smoke without supervision.  Keep all follow-up visits as told by your health care provider. This is important. Contact a health care provider if:  You have nausea or vomiting that does not get better with medicine.  You cannot eat or drink without vomiting.  You have pain that does not get better with medicine.  You are unable to pass urine.  You develop a skin rash.  You have a fever.  You have redness around your IV site that gets worse. Get help right away if:  You have difficulty breathing.  You have chest pain.  You have blood in your urine or stool, or you vomit  blood. Summary  After the procedure, it is common to have a sore throat or nausea. It is also common to feel tired.  Have a responsible adult stay with you for the first 24 hours after general anesthesia. It is important to have someone help care for you until you are awake and alert.  When you feel hungry, start by eating small amounts of foods that are soft and easy to digest (bland), such as toast. Gradually return to your regular diet.  Drink enough fluid to keep your urine pale yellow.  Return to your normal activities as told by your health care provider. Ask your health care provider what activities are safe for you. This information is not intended to replace advice given to you by your health care provider. Make sure you discuss any questions you have with your health care provider. Document Released: 11/17/2000 Document Revised: 08/14/2017 Document Reviewed: 03/27/2017 Elsevier Patient Education  2020 [employer].

## 2019-04-19 NOTE — Assessment & Plan Note (Signed)
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  Adjuvant chemotherapy with dose dense Adriamycin and Cytoxan x4 followed by Taxol x12 2.Followed by adjuvant radiation 3.Followed by adjuvant antiestrogen therapy No adverse effects due to participation in the upbeat clinical trial ----------------------------------------------------------- Current treatment: Cycle 1 day 1 dose dense Adriamycin and Cytoxan Labs reviewed Chemo consent obtained Echocardiogram 04/15/2019: EF 50 to 55%. Antiemetics were reviewed Return to clinic in 1 week for toxicity check and follow-up. Chemo education completed

## 2019-04-19 NOTE — Transfer of Care (Signed)
Immediate Anesthesia Transfer of Care Note  Patient: Linda Spears  Procedure(s) Performed: INSERTION PORT-A-CATH WITH ULTRASOUND (N/A Chest)  Patient Location: PACU  Anesthesia Type:General  Level of Consciousness: drowsy, patient cooperative and responds to stimulation  Airway & Oxygen Therapy: Patient Spontanous Breathing and Patient connected to face mask oxygen  Post-op Assessment: Report given to RN and Post -op Vital signs reviewed and stable  Post vital signs: Reviewed and stable  Last Vitals:  Vitals Value Taken Time  BP 138/78 04/19/19 1408  Temp    Pulse 85 04/19/19 1409  Resp 19 04/19/19 1409  SpO2 100 % 04/19/19 1409  Vitals shown include unvalidated device data.  Last Pain:  Vitals:   04/19/19 1104  TempSrc:   PainSc: 0-No pain         Complications: No apparent anesthesia complications

## 2019-04-19 NOTE — Interval H&P Note (Signed)
History and Physical Interval Note:  04/19/2019 12:24 PM  Linda Spears  has presented today for surgery, with the diagnosis of LEFT BREAST CANCER.  The various methods of treatment have been discussed with the patient and family.   Her daughter is her post op contact.  After consideration of risks, benefits and other options for treatment, the patient has consented to  Procedure(s): INSERTION PORT-A-CATH WITH ULTRASOUND (N/A) as a surgical intervention.  The patient's history has been reviewed, patient examined, no change in status, stable for surgery.  I have reviewed the patient's chart and labs.  Questions were answered to the patient's satisfaction.     Shann Medal

## 2019-04-19 NOTE — Anesthesia Preprocedure Evaluation (Signed)
Anesthesia Evaluation  Patient identified by MRN, date of birth, ID band Patient awake    Reviewed: Allergy & Precautions, NPO status , Patient's Chart, lab work & pertinent test results  Airway Mallampati: II  TM Distance: >3 FB Neck ROM: Full    Dental no notable dental hx. (+) Teeth Intact   Pulmonary Current Smoker and Patient abstained from smoking.,    Pulmonary exam normal breath sounds clear to auscultation       Cardiovascular Exercise Tolerance: Good negative cardio ROS Normal cardiovascular exam Rhythm:Regular Rate:Normal     Neuro/Psych negative neurological ROS  negative psych ROS   GI/Hepatic negative GI ROS, Neg liver ROS,   Endo/Other    Renal/GU negative Renal ROS     Musculoskeletal negative musculoskeletal ROS (+)   Abdominal   Peds  Hematology negative hematology ROS (+)   Anesthesia Other Findings Hx of L breast CA  Reproductive/Obstetrics                            Anesthesia Physical Anesthesia Plan  ASA: II  Anesthesia Plan: General   Post-op Pain Management:    Induction: Intravenous  PONV Risk Score and Plan: 3 and Treatment may vary due to age or medical condition, Ondansetron and Midazolam  Airway Management Planned: LMA  Additional Equipment:   Intra-op Plan:   Post-operative Plan:   Informed Consent: I have reviewed the patients History and Physical, chart, labs and discussed the procedure including the risks, benefits and alternatives for the proposed anesthesia with the patient or authorized representative who has indicated his/her understanding and acceptance.     Dental advisory given  Plan Discussed with:   Anesthesia Plan Comments:        Anesthesia Quick Evaluation

## 2019-04-20 ENCOUNTER — Encounter: Payer: Self-pay | Admitting: Hematology and Oncology

## 2019-04-20 ENCOUNTER — Telehealth: Payer: Self-pay | Admitting: *Deleted

## 2019-04-20 ENCOUNTER — Encounter (HOSPITAL_COMMUNITY): Payer: Self-pay | Admitting: Surgery

## 2019-04-20 NOTE — Telephone Encounter (Addendum)
Upbeat Study; Called patient regarding appointments for research labs and cardiac MRI on Monday 04/25/19.  Informed patient that  transportation has been arranged and will pick her up at 9:15 am on Monday. Instructed patient to fast for 3 hrs, after 7 am, for her 10 am lab appointment. Patient states she wants labs drawn from a vein in her arm on Monday and not from her PAC yet. She doesn't want to use the PAC until the next day for chemotherapy. Informed patient we can draw her labs for Dr. Lindi Adie on Monday along with the research labs. Then we can cancel the lab/flush appt on Tuesday. Patient agreed.   Reminded patient of MRI at 11 am after the lab appointment.  Asked patient to call if any problems or questions before her appointments. She verbalized understanding.  Foye Spurling, BSN, RN Clinical Research Nurse 04/20/2019 12:59 PM

## 2019-04-25 ENCOUNTER — Ambulatory Visit (HOSPITAL_COMMUNITY)
Admission: RE | Admit: 2019-04-25 | Discharge: 2019-04-25 | Disposition: A | Discharge status: Home / Self Care | Payer: Medicaid Other | Source: Ambulatory Visit | Attending: Hematology and Oncology | Admitting: Hematology and Oncology

## 2019-04-25 ENCOUNTER — Other Ambulatory Visit: Payer: Self-pay

## 2019-04-25 ENCOUNTER — Encounter: Payer: Self-pay | Admitting: Hematology and Oncology

## 2019-04-25 ENCOUNTER — Encounter: Payer: Self-pay | Admitting: *Deleted

## 2019-04-25 ENCOUNTER — Inpatient Hospital Stay: Payer: Medicaid Other

## 2019-04-25 DIAGNOSIS — C50919 Malignant neoplasm of unspecified site of unspecified female breast: Secondary | ICD-10-CM | POA: Diagnosis not present

## 2019-04-25 DIAGNOSIS — Z170 Estrogen receptor positive status [ER+]: Secondary | ICD-10-CM

## 2019-04-25 DIAGNOSIS — C50212 Malignant neoplasm of upper-inner quadrant of left female breast: Secondary | ICD-10-CM

## 2019-04-25 DIAGNOSIS — Z006 Encounter for examination for normal comparison and control in clinical research program: Secondary | ICD-10-CM

## 2019-04-25 LAB — CMP (CANCER CENTER ONLY)
ALT: 20 U/L (ref 0–44)
AST: 21 U/L (ref 15–41)
Albumin: 4.5 g/dL (ref 3.5–5.0)
Alkaline Phosphatase: 93 U/L (ref 38–126)
Anion gap: 8 (ref 5–15)
BUN: 12 mg/dL (ref 6–20)
CO2: 24 mmol/L (ref 22–32)
Calcium: 9.4 mg/dL (ref 8.9–10.3)
Chloride: 106 mmol/L (ref 98–111)
Creatinine: 0.86 mg/dL (ref 0.44–1.00)
GFR, Est AFR Am: >60 mL/min (ref >60)
GFR, Estimated: >60 mL/min (ref >60)
Glucose, Bld: 94 mg/dL (ref 70–99)
Potassium: 4.1 mmol/L (ref 3.5–5.1)
Sodium: 138 mmol/L (ref 135–145)
Total Bilirubin: 0.2 mg/dL — ABNORMAL LOW (ref 0.3–1.2)
Total Protein: 8.3 g/dL — ABNORMAL HIGH (ref 6.5–8.1)

## 2019-04-25 LAB — CBC WITH DIFFERENTIAL (CANCER CENTER ONLY)
Abs Immature Granulocytes: 0.04 10*3/uL (ref 0.00–0.07)
Basophils Absolute: 0 10*3/uL (ref 0.0–0.1)
Basophils Relative: 1 %
Eosinophils Absolute: 0.2 10*3/uL (ref 0.0–0.5)
Eosinophils Relative: 3 %
HCT: 42.1 % (ref 36.0–46.0)
Hemoglobin: 13.3 g/dL (ref 12.0–15.0)
Immature Granulocytes: 1 %
Lymphocytes Relative: 41 %
Lymphs Abs: 2.9 10*3/uL (ref 0.7–4.0)
MCH: 27.3 pg (ref 26.0–34.0)
MCHC: 31.6 g/dL (ref 30.0–36.0)
MCV: 86.4 fL (ref 80.0–100.0)
Monocytes Absolute: 0.6 10*3/uL (ref 0.1–1.0)
Monocytes Relative: 9 %
Neutro Abs: 3.3 10*3/uL (ref 1.7–7.7)
Neutrophils Relative %: 45 %
Platelet Count: 275 10*3/uL (ref 150–400)
RBC: 4.87 MIL/uL (ref 3.87–5.11)
RDW: 14.1 % (ref 11.5–15.5)
WBC Count: 7.1 10*3/uL (ref 4.0–10.5)
nRBC: 0 % (ref 0.0–0.2)

## 2019-04-25 LAB — RESEARCH LABS

## 2019-04-25 NOTE — Progress Notes (Signed)
Pt is approved for the $1000 [employer] grant.  

## 2019-04-25 NOTE — Progress Notes (Signed)
Patient Care Team: Tawny Asal as PCP - General (Physician Assistant) Mauro Kaufmann, RN as Oncology Nurse Navigator Rockwell Germany, RN as Oncology Nurse Navigator Alphonsa Overall, MD as Consulting Physician (General Surgery) Nicholas Lose, MD as Consulting Physician (Hematology and Oncology) Gery Pray, MD as Consulting Physician (Radiation Oncology)  DIAGNOSIS:    ICD-10-CM   1. Malignant neoplasm of upper-inner quadrant of left breast in female, estrogen receptor positive ([name])  C50.212    Z17.0     SUMMARY OF ONCOLOGIC HISTORY: Oncology History  Malignant neoplasm of upper-inner quadrant of left breast in female, estrogen receptor positive ([name])  02/07/2019 Initial Diagnosis   Diagnostic mammogram following palpable lump in the left breast x6 months showed 3.0cm mass at the 9 oc'clock position 2cm from the nipple. Biopsy confirmed grade 3 IDC, HER2 negative by FISH, ER+ (100%), PR+ (80%), Ki67 20%.    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    03/29/2019 Cancer Staging   Staging form: Breast, AJCC 8th Edition - Pathologic stage from 03/29/2019: Stage IIA (pT2, pN1a, cM0, G3, ER+, PR+, HER2-) - Signed by Nicholas Lose, MD on 03/29/2019   04/26/2019 -  Chemotherapy   Adj chemo with AC-T     CHIEF COMPLIANT: Cycle 1 Adriamycin and Cytoxan  INTERVAL HISTORY: Linda Spears is a 58 y.o. with above-mentioned history of left breast cancer who underwent a lumpectomy and is currently on adjuvant chemotherapy treatment with dose dense Adriamycin and Cytoxan. She presents to the clinic today for cycle 1.   REVIEW OF SYSTEMS:    Constitutional: Denies fevers, chills or abnormal weight loss Eyes: Denies blurriness of vision Ears, nose, mouth, throat, and face: Denies mucositis or sore throat Respiratory: Denies cough, dyspnea or wheezes Cardiovascular: Denies palpitation, chest discomfort Gastrointestinal: Denies nausea, heartburn or change in bowel habits Skin: Denies abnormal skin rashes Lymphatics: Denies new lymphadenopathy or easy bruising Neurological: Denies numbness, tingling or new weaknesses Behavioral/Psych: Mood is stable, no new changes  Extremities: No lower extremity edema Breast: denies any pain or lumps or nodules in either breasts All other systems were reviewed with the patient and are negative.  I have reviewed the past medical history, past surgical history, social history and family history with the patient and they are unchanged from previous note.  ALLERGIES:  has No Known Allergies.  MEDICATIONS:  Current Outpatient Medications  Medication Sig Dispense Refill  . acetaminophen (TYLENOL) 500 MG tablet Take 1,000 mg by mouth every 6 (six) hours as needed for moderate pain.    Marland Kitchen dexamethasone (DECADRON) 4 MG tablet Take 1 tablet day after chemo and 1 tablet 2 days after chemo with food 8 tablet 0  . HYDROcodone-acetaminophen (NORCO/VICODIN) 5-325 MG tablet Take 1 tablet by mouth every 6 (six) hours as needed for moderate pain. 10 tablet 0  . lidocaine-prilocaine (EMLA) cream Apply to affected area once 30 g 3  . LORazepam (ATIVAN) 0.5 MG tablet Take 1 tablet (0.5 mg total) by mouth at bedtime as needed for sleep. 30 tablet 0  . ondansetron (ZOFRAN) 8 MG tablet Take 1 tablet (8 mg total) by mouth 2 (two) times daily as  needed. Start on the third day after chemotherapy. 30 tablet 1  . prochlorperazine (COMPAZINE) 10 MG tablet Take 1 tablet (10 mg total) by mouth every 6 (six) hours as needed (Nausea or vomiting). 30 tablet 1   No current facility-administered medications for this visit.      PHYSICAL EXAMINATION: ECOG PERFORMANCE STATUS: 1 - Symptomatic but completely ambulatory  Vitals:   04/26/19 0807  BP: (!) 149/80  Pulse: (!) 56  Resp: 18  Temp: 98.2 F (36.8 C)  SpO2: 100%   Filed Weights   04/26/19 0807  Weight: 123 lb 4.8 oz (55.9 kg)    GENERAL: alert, no distress and comfortable SKIN: skin color, texture, turgor are normal, no rashes or significant lesions EYES: normal, Conjunctiva are pink and non-injected, sclera clear OROPHARYNX: no exudate, no erythema and lips, buccal mucosa, and tongue normal  NECK: supple, thyroid normal size, non-tender, without nodularity LYMPH: no palpable lymphadenopathy in the cervical, axillary or inguinal LUNGS: clear to auscultation and percussion with normal breathing effort HEART: regular rate & rhythm and no murmurs and no lower extremity edema ABDOMEN: abdomen soft, non-tender and normal bowel sounds MUSCULOSKELETAL: no cyanosis of digits and no clubbing  NEURO: alert & oriented x 3 with fluent speech, no focal motor/sensory deficits EXTREMITIES: No lower extremity edema  LABORATORY DATA:  I have reviewed the data as listed CMP Latest Ref Rng & Units 04/25/2019 03/17/2019 02/09/2019  Glucose 70 - 99 mg/dL 94 83 119(H)  BUN 6 - 20 mg/dL _0 Creatinine 0.44 - 1.00 mg/dL 0.86 0.76 0.94  Sodium 135 - 145 mmol/L 138 136 138  Potassium 3.5 - 5.1 mmol/L 4.1 3.9 4.1  Chloride 98 - 111 mmol/L 106 103 104  CO2 22 - 32 mmol/L _1 Calcium 8.9 - 10.3 mg/dL 9.4 9.0 9.4  Total Protein 6.5 - 8.1 g/dL 8.3(H) - 7.9  Total Bilirubin 0.3 - 1.2 mg/dL 0.2(L) - 0.4  Alkaline Phos 38 - 126 U/L 93 - 79  AST 15 - 41 U/L 21 - 25  ALT 0 - 44 U/L 20 - 18    Lab Results  Component Value Date   WBC 7.1 04/25/2019   HGB 13.3 04/25/2019   HCT 42.1 04/25/2019   MCV 86.4 04/25/2019   PLT 275 04/25/2019   NEUTROABS 3.3 04/25/2019    ASSESSMENT & PLAN:  Malignant neoplasm of upper-inner quadrant of left breast in female,  estrogen receptor positive ([name]) 03/14/2019:Left lumpectomy [name]): IDC, 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  Adjuvant chemotherapy with dose dense Adriamycin and Cytoxan x4 followed by Taxol x12 2.Followed by adjuvant radiation 3.Followed by adjuvant antiestrogen therapy No adverse effects due to participation in the upbeat clinical trial ----------------------------------------------------------- Current treatment: Cycle 1 day 1 dose dense Adriamycin and Cytoxan Echocardiogram 04/15/2019: EF 50 to 55%. Labs reviewed Chemo consent obtained Chemo education completed Antiemetics were reviewed   Return to clinic in 1 week for toxicity check and follow-up.   No orders of the defined types were placed in this encounter.  The patient has a good understanding of the overall plan. she agrees with it. she will call with any problems that may develop before the next visit here.  Nicholas Lose, MD 04/26/2019  Julious Oka Dorshimer am acting as Education administrator  for Dr. Nicholas Lose.  I have reviewed the above documentation for accuracy and completeness, and I agree with the above.

## 2019-04-26 ENCOUNTER — Inpatient Hospital Stay: Payer: Medicaid Other

## 2019-04-26 ENCOUNTER — Other Ambulatory Visit: Payer: Medicaid Other

## 2019-04-26 ENCOUNTER — Encounter: Payer: Self-pay | Admitting: *Deleted

## 2019-04-26 ENCOUNTER — Other Ambulatory Visit: Payer: Self-pay

## 2019-04-26 ENCOUNTER — Telehealth: Payer: Self-pay | Admitting: *Deleted

## 2019-04-26 ENCOUNTER — Inpatient Hospital Stay: Payer: Medicaid Other | Attending: Hematology and Oncology | Admitting: Hematology and Oncology

## 2019-04-26 DIAGNOSIS — Z006 Encounter for examination for normal comparison and control in clinical research program: Secondary | ICD-10-CM

## 2019-04-26 DIAGNOSIS — Z5189 Encounter for other specified aftercare: Secondary | ICD-10-CM | POA: Diagnosis not present

## 2019-04-26 DIAGNOSIS — C50212 Malignant neoplasm of upper-inner quadrant of left female breast: Secondary | ICD-10-CM

## 2019-04-26 DIAGNOSIS — Z5111 Encounter for antineoplastic chemotherapy: Secondary | ICD-10-CM | POA: Insufficient documentation

## 2019-04-26 DIAGNOSIS — Z170 Estrogen receptor positive status [ER+]: Secondary | ICD-10-CM | POA: Diagnosis not present

## 2019-04-26 MED ORDER — PEGFILGRASTIM 6 MG/0.6ML ~~LOC~~ PSKT
6.0000 mg | PREFILLED_SYRINGE | Freq: Once | SUBCUTANEOUS | Status: AC
  Administered 2019-04-26: 6 mg via SUBCUTANEOUS

## 2019-04-26 MED ORDER — PALONOSETRON HCL INJECTION 0.25 MG/5ML
0.2500 mg | Freq: Once | INTRAVENOUS | Status: AC
  Administered 2019-04-26: 09:00:00 0.25 mg via INTRAVENOUS

## 2019-04-26 MED ORDER — SODIUM CHLORIDE 0.9 % IV SOLN
Freq: Once | INTRAVENOUS | Status: AC
  Administered 2019-04-26: 09:00:00 via INTRAVENOUS
  Filled 2019-04-26: qty 250

## 2019-04-26 MED ORDER — PALONOSETRON HCL INJECTION 0.25 MG/5ML
INTRAVENOUS | Status: AC
  Filled 2019-04-26: qty 5

## 2019-04-26 MED ORDER — SODIUM CHLORIDE 0.9 % IV SOLN
600.0000 mg/m2 | Freq: Once | INTRAVENOUS | Status: AC
  Administered 2019-04-26: 940 mg via INTRAVENOUS
  Filled 2019-04-26: qty 47

## 2019-04-26 MED ORDER — DOXORUBICIN HCL CHEMO IV INJECTION 2 MG/ML
60.0000 mg/m2 | Freq: Once | INTRAVENOUS | Status: AC
  Administered 2019-04-26: 94 mg via INTRAVENOUS
  Filled 2019-04-26: qty 47

## 2019-04-26 MED ORDER — SODIUM CHLORIDE 0.9% FLUSH
10.0000 mL | INTRAVENOUS | Status: DC | PRN
  Administered 2019-04-26: 10 mL
  Filled 2019-04-26: qty 10

## 2019-04-26 MED ORDER — HEPARIN SOD (PORK) LOCK FLUSH 100 UNIT/ML IV SOLN
500.0000 [IU] | Freq: Once | INTRAVENOUS | Status: AC | PRN
  Administered 2019-04-26: 500 [IU]
  Filled 2019-04-26: qty 5

## 2019-04-26 MED ORDER — PEGFILGRASTIM 6 MG/0.6ML ~~LOC~~ PSKT
PREFILLED_SYRINGE | SUBCUTANEOUS | Status: AC
  Filled 2019-04-26: qty 0.6

## 2019-04-26 MED ORDER — SODIUM CHLORIDE 0.9 % IV SOLN
Freq: Once | INTRAVENOUS | Status: AC
  Administered 2019-04-26: 09:00:00 via INTRAVENOUS
  Filled 2019-04-26: qty 5

## 2019-04-26 NOTE — Research (Signed)
DCP-001 Use of a Clinical Trial Screening Tool to Address Cancer Health Disparities in the [facility] Program: Met with patient in infusion room for 20 minutes to review the DCP-001 study and offer her the opportunity to participate. Informed patient that participation is voluntary and involves a one time consent and collection of demographic variables, with the majority of data collected from her medical record. Noted that no patient identifiers are being reported to the study.  The consent and authorization forms were reviewed with the patient in their entirety. Explained the potential benefits and risks of participation in this study. All of her questions were answered and she agreed to participate. The consent form for [facility] 10/15/17, [place] Active 02/04/18 and authorization form dated 10/16/14 were signed and dated by the patient. Copies of the documents were given to the patient for her records.  Patient was then interviewed by this research nurse and answered the study questions without difficulty.  Thanked patient for her time and participation on this study.  Patient meets eligibility and will be enrolled in the DCP-001 study.  Worksheet and original consent given to research assistant to enroll patient on study. Foye Spurling, BSN, RN Clinical Research Nurse 04/26/2019 10:21 AM

## 2019-04-26 NOTE — Progress Notes (Signed)
Linda Spears, [place]; The following study activities were completed by patient on 04/25/19. Labs; Blood collected for research study per protocol. Confirmed patient had been fasting for at least 3 hours prior to appointment.  Cardiac MRI; Completed.  Gift Card; Given to patient by research assistant, Farris Has.  Foye Spurling, BSN, [employer]in [employer]

## 2019-04-26 NOTE — Telephone Encounter (Signed)
Spoke with patient on he 1st day of chemo.  She states it went well.  She had a lot of questions but she stated the her chemo nurse explained everything she was doing.  Encouraged her to call with any needs or concerns.

## 2019-04-26 NOTE — Patient Instructions (Signed)
[facility] Discharge Instructions for Patients Receiving Chemotherapy  Today you received the following chemotherapy agents: Adriamycin (Doxorubicin) and Cyclophosphamide (Cytoxan)  To help prevent nausea and vomiting after your treatment, we encourage you to take your nausea medication as directed. Received Aloxi during treatment today-->Take your Compazine prescription (not your Zofran) for the next 3 days as needed. Can add Zofran into your regimen starting Thursday if needed.   If you develop nausea and vomiting that is not controlled by your nausea medication, call the clinic.   BELOW ARE SYMPTOMS THAT SHOULD BE REPORTED IMMEDIATELY:  *FEVER GREATER THAN 100.5 F  *CHILLS WITH OR WITHOUT FEVER  NAUSEA AND VOMITING THAT IS NOT CONTROLLED WITH YOUR NAUSEA MEDICATION  *UNUSUAL SHORTNESS OF BREATH  *UNUSUAL BRUISING OR BLEEDING  TENDERNESS IN MOUTH AND THROAT WITH OR WITHOUT PRESENCE OF ULCERS  *URINARY PROBLEMS  *BOWEL PROBLEMS  UNUSUAL RASH Items with * indicate a potential emergency and should be followed up as soon as possible.  Feel free to call the clinic should you have any questions or concerns. The clinic phone number is [phone].  Please show the [facility] at check-in to the Emergency Department and triage nurse.  Doxorubicin injection What is this medicine? DOXORUBICIN (dox oh ROO bi sin) is a chemotherapy drug. It is used to treat many kinds of cancer like leukemia, lymphoma, neuroblastoma, sarcoma, and Wilms' tumor. It is also used to treat bladder cancer, breast cancer, lung cancer, ovarian cancer, stomach cancer, and thyroid cancer. This medicine may be used for other purposes; ask your health care provider or pharmacist if you have questions. COMMON BRAND NAME(S): Adriamycin, Adriamycin PFS, Adriamycin RDF, Rubex What should I tell my health care provider before I take this medicine? They need to know if you have any of these  conditions:  heart disease  history of low blood counts caused by a medicine  liver disease  recent or ongoing radiation therapy  an unusual or allergic reaction to doxorubicin, other chemotherapy agents, other medicines, foods, dyes, or preservatives  pregnant or trying to get pregnant  breast-feeding How should I use this medicine? This drug is given as an infusion into a vein. It is administered in a hospital or clinic by a specially trained health care professional. If you have pain, swelling, burning or any unusual feeling around the site of your injection, tell your health care professional right away. Talk to your pediatrician regarding the use of this medicine in children. Special care may be needed. Overdosage: If you think you have taken too much of this medicine contact a poison control center or emergency room at once. NOTE: This medicine is only for you. Do not share this medicine with others. What if I miss a dose? It is important not to miss your dose. Call your doctor or health care professional if you are unable to keep an appointment. What may interact with this medicine? This medicine may interact with the following medications:  6-mercaptopurine  paclitaxel  phenytoin  St. John's Wort  trastuzumab  verapamil This list may not describe all possible interactions. Give your health care provider a list of all the medicines, herbs, non-prescription drugs, or dietary supplements you use. Also tell them if you smoke, drink alcohol, or use illegal drugs. Some items may interact with your medicine. What should I watch for while using this medicine? This drug may make you feel generally unwell. This is not uncommon, as chemotherapy can affect healthy cells as  well as cancer cells. Report any side effects. Continue your course of treatment even though you feel ill unless your doctor tells you to stop. There is a maximum amount of this medicine you should receive  throughout your life. The amount depends on the medical condition being treated and your overall health. Your doctor will watch how much of this medicine you receive in your lifetime. Tell your doctor if you have taken this medicine before. You may need blood work done while you are taking this medicine. Your urine may turn red for a few days after your dose. This is not blood. If your urine is dark or brown, call your doctor. In some cases, you may be given additional medicines to help with side effects. Follow all directions for their use. Call your doctor or health care professional for advice if you get a fever, chills or sore throat, or other symptoms of a cold or flu. Do not treat yourself. This drug decreases your body's ability to fight infections. Try to avoid being around people who are sick. This medicine may increase your risk to bruise or bleed. Call your doctor or health care professional if you notice any unusual bleeding. Talk to your doctor about your risk of cancer. You may be more at risk for certain types of cancers if you take this medicine. Do not become pregnant while taking this medicine or for 6 months after stopping it. Women should inform their doctor if they wish to become pregnant or think they might be pregnant. Men should not father a child while taking this medicine and for 6 months after stopping it. There is a potential for serious side effects to an unborn child. Talk to your health care professional or pharmacist for more information. Do not breast-feed an infant while taking this medicine. This medicine has caused ovarian failure in some women and reduced sperm counts in some men This medicine may interfere with the ability to have a child. Talk with your doctor or health care professional if you are concerned about your fertility. This medicine may cause a decrease in Co-Enzyme Q-10. You should make sure that you get enough Co-Enzyme Q-10 while you are taking this  medicine. Discuss the foods you eat and the vitamins you take with your health care professional. What side effects may I notice from receiving this medicine? Side effects that you should report to your doctor or health care professional as soon as possible:  allergic reactions like skin rash, itching or hives, swelling of the face, lips, or tongue  breathing problems  chest pain  fast or irregular heartbeat  low blood counts - this medicine may decrease the number of white blood cells, red blood cells and platelets. You may be at increased risk for infections and bleeding.  pain, redness, or irritation at site where injected  signs of infection - fever or chills, cough, sore throat, pain or difficulty passing urine  signs of decreased platelets or bleeding - bruising, pinpoint red spots on the skin, black, tarry stools, blood in the urine  swelling of the ankles, feet, hands  tiredness  weakness Side effects that usually do not require medical attention (report to your doctor or health care professional if they continue or are bothersome):  diarrhea  hair loss  mouth sores  nail discoloration or damage  nausea  red colored urine  vomiting This list may not describe all possible side effects. Call your doctor for medical advice about side effects. You  may report side effects to FDA at 1-800-FDA-1088. Where should I keep my medicine? This drug is given in a hospital or clinic and will not be stored at home. NOTE: This sheet is a summary. It may not cover all possible information. If you have questions about this medicine, talk to your doctor, pharmacist, or health care provider.  2020 Elsevier/Gold Standard (2017-03-25 11:01:26)  Cyclophosphamide injection What is this medicine? CYCLOPHOSPHAMIDE (sye kloe FOSS fa mide) is a chemotherapy drug. It slows the growth of cancer cells. This medicine is used to treat many types of cancer like lymphoma, myeloma, leukemia,  breast cancer, and ovarian cancer, to name a few. This medicine may be used for other purposes; ask your health care provider or pharmacist if you have questions. COMMON BRAND NAME(S): Cytoxan, Neosar What should I tell my health care provider before I take this medicine? They need to know if you have any of these conditions:  blood disorders  history of other chemotherapy  infection  kidney disease  liver disease  recent or ongoing radiation therapy  tumors in the bone marrow  an unusual or allergic reaction to cyclophosphamide, other chemotherapy, other medicines, foods, dyes, or preservatives  pregnant or trying to get pregnant  breast-feeding How should I use this medicine? This drug is usually given as an injection into a vein or muscle or by infusion into a vein. It is administered in a hospital or clinic by a specially trained health care professional. Talk to your pediatrician regarding the use of this medicine in children. Special care may be needed. Overdosage: If you think you have taken too much of this medicine contact a poison control center or emergency room at once. NOTE: This medicine is only for you. Do not share this medicine with others. What if I miss a dose? It is important not to miss your dose. Call your doctor or health care professional if you are unable to keep an appointment. What may interact with this medicine? This medicine may interact with the following medications:  amiodarone  amphotericin B  azathioprine  certain antiviral medicines for HIV or AIDS such as protease inhibitors (e.g., indinavir, ritonavir) and zidovudine  certain blood pressure medications such as benazepril, captopril, enalapril, fosinopril, lisinopril, moexipril, monopril, perindopril, quinapril, ramipril, trandolapril  certain cancer medications such as anthracyclines (e.g., daunorubicin, doxorubicin), busulfan, cytarabine, paclitaxel, pentostatin, tamoxifen,  trastuzumab  certain diuretics such as chlorothiazide, chlorthalidone, hydrochlorothiazide, indapamide, metolazone  certain medicines that treat or prevent blood clots like warfarin  certain muscle relaxants such as succinylcholine  cyclosporine  etanercept  indomethacin  medicines to increase blood counts like filgrastim, pegfilgrastim, sargramostim  medicines used as general anesthesia  metronidazole  natalizumab This list may not describe all possible interactions. Give your health care provider a list of all the medicines, herbs, non-prescription drugs, or dietary supplements you use. Also tell them if you smoke, drink alcohol, or use illegal drugs. Some items may interact with your medicine. What should I watch for while using this medicine? Visit your doctor for checks on your progress. This drug may make you feel generally unwell. This is not uncommon, as chemotherapy can affect healthy cells as well as cancer cells. Report any side effects. Continue your course of treatment even though you feel ill unless your doctor tells you to stop. Drink water or other fluids as directed. Urinate often, even at night. In some cases, you may be given additional medicines to help with side effects. Follow all directions for  their use. Call your doctor or health care professional for advice if you get a fever, chills or sore throat, or other symptoms of a cold or flu. Do not treat yourself. This drug decreases your body's ability to fight infections. Try to avoid being around people who are sick. This medicine may increase your risk to bruise or bleed. Call your doctor or health care professional if you notice any unusual bleeding. Be careful brushing and flossing your teeth or using a toothpick because you may get an infection or bleed more easily. If you have any dental work done, tell your dentist you are receiving this medicine. You may get drowsy or dizzy. Do not drive, use machinery, or do  anything that needs mental alertness until you know how this medicine affects you. Do not become pregnant while taking this medicine or for 1 year after stopping it. Women should inform their doctor if they wish to become pregnant or think they might be pregnant. Men should not father a child while taking this medicine and for 4 months after stopping it. There is a potential for serious side effects to an unborn child. Talk to your health care professional or pharmacist for more information. Do not breast-feed an infant while taking this medicine. This medicine may interfere with the ability to have a child. This medicine has caused ovarian failure in some women. This medicine has caused reduced sperm counts in some men. You should talk with your doctor or health care professional if you are concerned about your fertility. If you are going to have surgery, tell your doctor or health care professional that you have taken this medicine. What side effects may I notice from receiving this medicine? Side effects that you should report to your doctor or health care professional as soon as possible:  allergic reactions like skin rash, itching or hives, swelling of the face, lips, or tongue  low blood counts - this medicine may decrease the number of white blood cells, red blood cells and platelets. You may be at increased risk for infections and bleeding.  signs of infection - fever or chills, cough, sore throat, pain or difficulty passing urine  signs of decreased platelets or bleeding - bruising, pinpoint red spots on the skin, black, tarry stools, blood in the urine  signs of decreased red blood cells - unusually weak or tired, fainting spells, lightheadedness  breathing problems  dark urine  dizziness  palpitations  swelling of the ankles, feet, hands  trouble passing urine or change in the amount of urine  weight gain  yellowing of the eyes or skin Side effects that usually do not  require medical attention (report to your doctor or health care professional if they continue or are bothersome):  changes in nail or skin color  hair loss  missed menstrual periods  mouth sores  nausea, vomiting This list may not describe all possible side effects. Call your doctor for medical advice about side effects. You may report side effects to FDA at 1-800-FDA-1088. Where should I keep my medicine? This drug is given in a hospital or clinic and will not be stored at home. NOTE: This sheet is a summary. It may not cover all possible information. If you have questions about this medicine, talk to your doctor, pharmacist, or health care provider.  2020 Elsevier/Gold Standard (2012-06-25 16:22:58)  Pegfilgrastim injection (Onpro Device) What is this medicine? PEGFILGRASTIM (PEG fil gra stim) is a long-acting granulocyte colony-stimulating factor that stimulates the growth  of neutrophils, a type of white blood cell important in the body's fight against infection. It is used to reduce the incidence of fever and infection in patients with certain types of cancer who are receiving chemotherapy that affects the bone marrow, and to increase survival after being exposed to high doses of radiation. This medicine may be used for other purposes; ask your health care provider or pharmacist if you have questions. COMMON BRAND NAME(S): Steve Rattler, Ziextenzo What should I tell my health care provider before I take this medicine? They need to know if you have any of these conditions:  kidney disease  latex allergy  ongoing radiation therapy  sickle cell disease  skin reactions to acrylic adhesives (On-Body Injector only)  an unusual or allergic reaction to pegfilgrastim, filgrastim, other medicines, foods, dyes, or preservatives  pregnant or trying to get pregnant  breast-feeding How should I use this medicine? This medicine is for injection under the skin. If you get  this medicine at home, you will be taught how to prepare and give the pre-filled syringe or how to use the On-body Injector. Refer to the patient Instructions for Use for detailed instructions. Use exactly as directed. Tell your healthcare provider immediately if you suspect that the On-body Injector may not have performed as intended or if you suspect the use of the On-body Injector resulted in a missed or partial dose. It is important that you put your used needles and syringes in a special sharps container. Do not put them in a trash can. If you do not have a sharps container, call your pharmacist or healthcare provider to get one. Talk to your pediatrician regarding the use of this medicine in children. While this drug may be prescribed for selected conditions, precautions do apply. Overdosage: If you think you have taken too much of this medicine contact a poison control center or emergency room at once. NOTE: This medicine is only for you. Do not share this medicine with others. What if I miss a dose? It is important not to miss your dose. Call your doctor or health care professional if you miss your dose. If you miss a dose due to an On-body Injector failure or leakage, a new dose should be administered as soon as possible using a single prefilled syringe for manual use. What may interact with this medicine? Interactions have not been studied. Give your health care provider a list of all the medicines, herbs, non-prescription drugs, or dietary supplements you use. Also tell them if you smoke, drink alcohol, or use illegal drugs. Some items may interact with your medicine. This list may not describe all possible interactions. Give your health care provider a list of all the medicines, herbs, non-prescription drugs, or dietary supplements you use. Also tell them if you smoke, drink alcohol, or use illegal drugs. Some items may interact with your medicine. What should I watch for while using this  medicine? You may need blood work done while you are taking this medicine. If you are going to need a MRI, CT scan, or other procedure, tell your doctor that you are using this medicine (On-Body Injector only). What side effects may I notice from receiving this medicine? Side effects that you should report to your doctor or health care professional as soon as possible:  allergic reactions like skin rash, itching or hives, swelling of the face, lips, or tongue  back pain  dizziness  fever  pain, redness, or irritation at site where injected  pinpoint red spots on the skin  red or dark-brown urine  shortness of breath or breathing problems  stomach or side pain, or pain at the shoulder  swelling  tiredness  trouble passing urine or change in the amount of urine Side effects that usually do not require medical attention (report to your doctor or health care professional if they continue or are bothersome):  bone pain  muscle pain This list may not describe all possible side effects. Call your doctor for medical advice about side effects. You may report side effects to FDA at 1-800-FDA-1088. Where should I keep my medicine? Keep out of the reach of children. If you are using this medicine at home, you will be instructed on how to store it. Throw away any unused medicine after the expiration date on the label. NOTE: This sheet is a summary. It may not cover all possible information. If you have questions about this medicine, talk to your doctor, pharmacist, or health care provider.  2020 Elsevier/Gold Standard (2017-11-16 16:57:08)

## 2019-04-27 ENCOUNTER — Telehealth: Payer: Self-pay | Admitting: *Deleted

## 2019-04-28 ENCOUNTER — Ambulatory Visit: Payer: Medicaid Other

## 2019-05-01 NOTE — Progress Notes (Signed)
Patient Care Team: Tawny Asal as PCP - General (Physician Assistant) Mauro Kaufmann, RN as Oncology Nurse Navigator Rockwell Germany, RN as Oncology Nurse Navigator Alphonsa Overall, MD as Consulting Physician (General Surgery) Nicholas Lose, MD as Consulting Physician (Hematology and Oncology) Gery Pray, MD as Consulting Physician (Radiation Oncology)  DIAGNOSIS:    ICD-10-CM   1. Malignant neoplasm of upper-inner quadrant of left breast in female, estrogen receptor positive ([place])  C50.212    Z17.0     SUMMARY OF ONCOLOGIC HISTORY: Oncology History  Malignant neoplasm of upper-inner quadrant of left breast in female, estrogen receptor positive ([place])  02/07/2019 Initial Diagnosis   Diagnostic mammogram following palpable lump in the left breast x6 months showed 3.0cm mass at the 9 oc'clock position 2cm from the nipple. Biopsy confirmed grade 3 IDC, HER2 negative by FISH, ER+ (100%), PR+ (80%), Ki67 20%.    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    03/29/2019 Cancer Staging   Staging form: Breast, AJCC 8th Edition - Pathologic stage from 03/29/2019: Stage IIA (pT2, pN1a, cM0, G3, ER+, PR+, HER2-) - Signed by Nicholas Lose, MD on 03/29/2019   04/26/2019 -  Chemotherapy   Adj chemo with AC-T     CHIEF COMPLIANT: Cycle 1 Day 8 Adriamycin and Cytoxan  INTERVAL HISTORY: Linda Spears is a 58 y.o. with above-mentioned history of left breast cancer who underwent a lumpectomy and is currently on adjuvant chemotherapy treatment with dose dense Adriamycin and Cytoxan. She presents to the clinic today for a toxicity check following cycle 1.    REVIEW OF SYSTEMS:   Constitutional: Denies fevers, chills or abnormal weight loss Eyes: Denies blurriness of vision Ears, nose, mouth, throat, and face: Denies mucositis or sore throat Respiratory: Denies cough, dyspnea or wheezes Cardiovascular: Denies palpitation, chest discomfort Gastrointestinal: Denies nausea, heartburn or change in bowel habits Skin: Denies abnormal skin rashes Lymphatics: Denies new lymphadenopathy or easy bruising Neurological: Denies numbness, tingling or new weaknesses Behavioral/Psych: Mood is stable, no new changes  Extremities: No lower extremity edema Breast: denies any pain or lumps or nodules in either breasts All other systems were reviewed with the patient and are negative.  I have reviewed the past medical history, past surgical history, social history and family history with the patient and they are unchanged from previous note.  ALLERGIES:  has No Known Allergies.  MEDICATIONS:  Current Outpatient Medications  Medication Sig Dispense Refill  . acetaminophen (TYLENOL) 500 MG tablet Take 1,000 mg by mouth every 6 (six) hours as needed for moderate pain.    Marland Kitchen dexamethasone (DECADRON) 4 MG tablet Take 1 tablet day after chemo and 1 tablet 2 days after chemo with food 8 tablet 0  . HYDROcodone-acetaminophen (NORCO/VICODIN) 5-325 MG tablet Take 1 tablet by mouth every 6 (six) hours as needed for moderate pain. 10 tablet 0  . lidocaine-prilocaine (EMLA) cream Apply to affected area once 30 g 3  . LORazepam (ATIVAN) 0.5 MG tablet Take 1 tablet (0.5 mg total) by mouth at bedtime as needed for sleep. 30 tablet 0  . ondansetron (ZOFRAN) 8 MG tablet Take 1 tablet (8 mg total) by  mouth 2 (two) times daily as needed. Start on the third day after chemotherapy. 30 tablet 1  . prochlorperazine (COMPAZINE) 10 MG tablet Take 1 tablet (10 mg total) by mouth every 6 (six) hours as needed (Nausea or vomiting). 30 tablet 1   No current facility-administered  medications for this visit.     PHYSICAL EXAMINATION: ECOG PERFORMANCE STATUS: 1 - Symptomatic but completely ambulatory  Vitals:   05/03/19 0851  BP: 130/71  Pulse: 82  Resp: 18  Temp: 98.5 F (36.9 C)  SpO2: 100%   Filed Weights   05/03/19 0851  Weight: 119 lb 11.2 oz (54.3 kg)    GENERAL: alert, no distress and comfortable SKIN: skin color, texture, turgor are normal, no rashes or significant lesions EYES: normal, Conjunctiva are pink and non-injected, sclera clear OROPHARYNX: no exudate, no erythema and lips, buccal mucosa, and tongue normal  NECK: supple, thyroid normal size, non-tender, without nodularity LYMPH: no palpable lymphadenopathy in the cervical, axillary or inguinal LUNGS: clear to auscultation and percussion with normal breathing effort HEART: regular rate & rhythm and no murmurs and no lower extremity edema ABDOMEN: abdomen soft, non-tender and normal bowel sounds MUSCULOSKELETAL: no cyanosis of digits and no clubbing  NEURO: alert & oriented x 3 with fluent speech, no focal motor/sensory deficits EXTREMITIES: No lower extremity edema  LABORATORY DATA:  I have reviewed the data as listed CMP Latest Ref Rng & Units 04/25/2019 03/17/2019 02/09/2019  Glucose 70 - 99 mg/dL 94 83 119(H)  BUN 6 - 20 mg/dL _0 Creatinine 0.44 - 1.00 mg/dL 0.86 0.76 0.94  Sodium 135 - 145 mmol/L 138 136 138  Potassium 3.5 - 5.1 mmol/L 4.1 3.9 4.1  Chloride 98 - 111 mmol/L 106 103 104  CO2 22 - 32 mmol/L _1 Calcium 8.9 - 10.3 mg/dL 9.4 9.0 9.4  Total Protein 6.5 - 8.1 g/dL 8.3(H) - 7.9  Total Bilirubin 0.3 - 1.2 mg/dL 0.2(L) - 0.4  Alkaline Phos 38 - 126 U/L 93 - 79  AST 15 - 41 U/L 21 - 25  ALT 0 - 44 U/L 20 - 18    Lab Results  Component Value Date   WBC 5.1 05/03/2019   HGB 12.2 05/03/2019   HCT 38.6 05/03/2019   MCV 86.9 05/03/2019   PLT 131 (L) 05/03/2019   NEUTROABS PENDING 05/03/2019    ASSESSMENT & PLAN:  Malignant neoplasm of upper-inner  quadrant of left breast in female, estrogen receptor positive ([place]) 03/14/2019:Left lumpectomy [name]): IDC, 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Adjuvant chemotherapy with dose dense Adriamycin and Cytoxan x4 followed by Taxol x12 2.Followed by adjuvant radiation 3.Followed by adjuvant antiestrogen therapy No adverse effects due to participation in the upbeat clinical trial ----------------------------------------------------------- Current treatment: Cycle 1 day 8 dose dense Adriamycin and Cytoxan Echocardiogram 04/15/2019: EF 50 to 55%. Labs reviewed Chemo toxicities: Mild constipation.  Resolved with Dulcolax. Denies any nausea or vomiting. Felt remarkably well.  Return to clinic in 1 week for cycle 2    No orders of the defined types were placed in this encounter.  The patient has a good understanding of the overall plan. she agrees with it. she will call with any problems that may develop before the next visit here.  Nicholas Lose, MD 05/03/2019  I, Cloyde Reams Dorshimer am acting as scribe for Dr. Nicholas Lose.  I have reviewed the above documentation for accuracy and completeness, and I agree with the above.

## 2019-05-03 ENCOUNTER — Inpatient Hospital Stay (HOSPITAL_BASED_OUTPATIENT_CLINIC_OR_DEPARTMENT_OTHER): Payer: Medicaid Other | Admitting: Hematology and Oncology

## 2019-05-03 ENCOUNTER — Inpatient Hospital Stay: Payer: Medicaid Other

## 2019-05-03 ENCOUNTER — Other Ambulatory Visit: Payer: Self-pay

## 2019-05-03 DIAGNOSIS — C50212 Malignant neoplasm of upper-inner quadrant of left female breast: Secondary | ICD-10-CM | POA: Diagnosis not present

## 2019-05-03 DIAGNOSIS — Z5111 Encounter for antineoplastic chemotherapy: Secondary | ICD-10-CM | POA: Diagnosis not present

## 2019-05-03 DIAGNOSIS — Z170 Estrogen receptor positive status [ER+]: Secondary | ICD-10-CM

## 2019-05-03 LAB — CBC WITH DIFFERENTIAL (CANCER CENTER ONLY)
Abs Immature Granulocytes: 0.1 10*3/uL — ABNORMAL HIGH (ref 0.00–0.07)
Basophils Absolute: 0.1 10*3/uL (ref 0.0–0.1)
Basophils Relative: 2 %
Eosinophils Absolute: 0.1 10*3/uL (ref 0.0–0.5)
Eosinophils Relative: 1 %
HCT: 38.6 % (ref 36.0–46.0)
Hemoglobin: 12.2 g/dL (ref 12.0–15.0)
Immature Granulocytes: 2 %
Lymphocytes Relative: 43 %
Lymphs Abs: 2.2 10*3/uL (ref 0.7–4.0)
MCH: 27.5 pg (ref 26.0–34.0)
MCHC: 31.6 g/dL (ref 30.0–36.0)
MCV: 86.9 fL (ref 80.0–100.0)
Monocytes Absolute: 0.6 10*3/uL (ref 0.1–1.0)
Monocytes Relative: 12 %
Neutro Abs: 2 10*3/uL (ref 1.7–7.7)
Neutrophils Relative %: 40 %
Platelet Count: 131 10*3/uL — ABNORMAL LOW (ref 150–400)
RBC: 4.44 MIL/uL (ref 3.87–5.11)
RDW: 13.6 % (ref 11.5–15.5)
WBC Count: 5.1 10*3/uL (ref 4.0–10.5)
nRBC: 0 % (ref 0.0–0.2)

## 2019-05-03 LAB — CMP (CANCER CENTER ONLY)
ALT: 15 U/L (ref 0–44)
AST: 15 U/L (ref 15–41)
Albumin: 4.1 g/dL (ref 3.5–5.0)
Alkaline Phosphatase: 101 U/L (ref 38–126)
Anion gap: 8 (ref 5–15)
BUN: 11 mg/dL (ref 6–20)
CO2: 27 mmol/L (ref 22–32)
Calcium: 9.4 mg/dL (ref 8.9–10.3)
Chloride: 102 mmol/L (ref 98–111)
Creatinine: 0.75 mg/dL (ref 0.44–1.00)
GFR, Est AFR Am: >60 mL/min (ref >60)
GFR, Estimated: >60 mL/min (ref >60)
Glucose, Bld: 95 mg/dL (ref 70–99)
Potassium: 4.7 mmol/L (ref 3.5–5.1)
Sodium: 137 mmol/L (ref 135–145)
Total Bilirubin: 0.3 mg/dL (ref 0.3–1.2)
Total Protein: 7.4 g/dL (ref 6.5–8.1)

## 2019-05-03 NOTE — Assessment & Plan Note (Signed)
03/14/2019:Left lumpectomy [name]): IDC, 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Adjuvant chemotherapy with dose dense Adriamycin and Cytoxan x4 followed by Taxol x12 2.Followed by adjuvant radiation 3.Followed by adjuvant antiestrogen therapy No adverse effects due to participation in the upbeat clinical trial ----------------------------------------------------------- Current treatment: Cycle 1 day 8 dose dense Adriamycin and Cytoxan Echocardiogram 04/15/2019: EF 50 to 55%. Labs reviewed Chemo toxicities:  Return to clinic in 1 week for cycle 2

## 2019-05-09 NOTE — Progress Notes (Signed)
Patient Care Team: Tawny Asal as PCP - General (Physician Assistant) Mauro Kaufmann, RN as Oncology Nurse Navigator Rockwell Germany, RN as Oncology Nurse Navigator Alphonsa Overall, MD as Consulting Physician (General Surgery) Nicholas Lose, MD as Consulting Physician (Hematology and Oncology) Gery Pray, MD as Consulting Physician (Radiation Oncology)  DIAGNOSIS:    ICD-10-CM   1. Malignant neoplasm of upper-inner quadrant of left breast in female, estrogen receptor positive ([place])  C50.212    Z17.0     SUMMARY OF ONCOLOGIC HISTORY: Oncology History  Malignant neoplasm of upper-inner quadrant of left breast in female, estrogen receptor positive ([place])  02/07/2019 Initial Diagnosis   Diagnostic mammogram following palpable lump in the left breast x6 months showed 3.0cm mass at the 9 oc'clock position 2cm from the nipple. Biopsy confirmed grade 3 IDC, HER2 negative by FISH, ER+ (100%), PR+ (80%), Ki67 20%.    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    03/29/2019 Cancer Staging   Staging form: Breast, AJCC 8th Edition - Pathologic stage from 03/29/2019: Stage IIA (pT2, pN1a, cM0, G3, ER+, PR+, HER2-) - Signed by Nicholas Lose, MD on 03/29/2019   04/26/2019 -  Chemotherapy   Adj chemo with AC-T     CHIEF COMPLIANT: Cycle 2 Adriamycin and Cytoxan  INTERVAL HISTORY: Linda Spears is a 58 y.o. with above-mentioned history of left breast cancer who underwent a lumpectomy and is currently on adjuvant chemotherapy treatment withdose dense Adriamycin and Cytoxan.She presents to the clinic todayfor cycle 2.  She has had no further problems  with constipation.  She feels remarkably well.  Denies any fevers or chills.  Denies any nausea or vomiting.  REVIEW OF SYSTEMS:   Constitutional: Denies fevers, chills or abnormal weight loss Eyes: Denies blurriness of vision Ears, nose, mouth, throat, and face: Denies mucositis or sore throat Respiratory: Denies cough, dyspnea or wheezes Cardiovascular: Denies palpitation, chest discomfort Gastrointestinal: Denies nausea, heartburn.  Constipation resolved. Skin: Denies abnormal skin rashes Lymphatics: Denies new lymphadenopathy or easy bruising Neurological: Denies numbness, tingling or new weaknesses Behavioral/Psych: Mood is stable, no new changes  Extremities: No lower extremity edema Breast: denies any pain or lumps or nodules in either breasts All other systems were reviewed with the patient and are negative.  I have reviewed the past medical history, past surgical history, social history and family history with the patient and they are unchanged from previous note.  ALLERGIES:  has No Known Allergies.  MEDICATIONS:  Current Outpatient Medications  Medication Sig Dispense Refill  . acetaminophen (TYLENOL) 500 MG tablet Take 1,000 mg by mouth every 6 (six) hours as needed for moderate pain.    Marland Kitchen dexamethasone (DECADRON) 4 MG tablet Take 1 tablet day after chemo and 1 tablet 2 days after chemo with food 8 tablet 0  . HYDROcodone-acetaminophen (NORCO/VICODIN) 5-325 MG tablet Take 1 tablet by mouth every 6 (six) hours as needed for moderate pain. 10 tablet 0  . lidocaine-prilocaine (EMLA) cream Apply to affected area once 30 g 3  . LORazepam (ATIVAN) 0.5 MG tablet Take 1 tablet (0.5 mg total) by mouth at bedtime as needed for sleep. 30 tablet 0  .  ondansetron (ZOFRAN) 8 MG tablet Take 1 tablet (8 mg total) by mouth 2 (two) times daily as needed. Start on the third day after chemotherapy. 30 tablet 1  . prochlorperazine (COMPAZINE) 10 MG tablet Take 1 tablet (10 mg total) by mouth  every 6 (six) hours as needed (Nausea or vomiting). 30 tablet 1   No current facility-administered medications for this visit.     PHYSICAL EXAMINATION: ECOG PERFORMANCE STATUS: 1 - Symptomatic but completely ambulatory  Vitals:   05/10/19 0916  BP: (!) 142/86  Pulse: 63  Resp: 20  Temp: 97.8 F (36.6 C)  SpO2: 100%   Filed Weights   05/10/19 0916  Weight: 124 lb 3.2 oz (56.3 kg)    GENERAL: alert, no distress and comfortable SKIN: skin color, texture, turgor are normal, no rashes or significant lesions EYES: normal, Conjunctiva are pink and non-injected, sclera clear OROPHARYNX: no exudate, no erythema and lips, buccal mucosa, and tongue normal  NECK: supple, thyroid normal size, non-tender, without nodularity LYMPH: no palpable lymphadenopathy in the cervical, axillary or inguinal LUNGS: clear to auscultation and percussion with normal breathing effort HEART: regular rate & rhythm and no murmurs and no lower extremity edema ABDOMEN: abdomen soft, non-tender and normal bowel sounds MUSCULOSKELETAL: no cyanosis of digits and no clubbing  NEURO: alert & oriented x 3 with fluent speech, no focal motor/sensory deficits EXTREMITIES: No lower extremity edema  LABORATORY DATA:  I have reviewed the data as listed CMP Latest Ref Rng & Units 05/03/2019 04/25/2019 03/17/2019  Glucose 70 - 99 mg/dL 95 94 83  BUN 6 - 20 mg/dL 11 12 12   Creatinine 0.44 - 1.00 mg/dL 0.75 0.86 0.76  Sodium 135 - 145 mmol/L 137 138 136  Potassium 3.5 - 5.1 mmol/L 4.7 4.1 3.9  Chloride 98 - 111 mmol/L 102 106 103  CO2 22 - 32 mmol/L 27 24 24   Calcium 8.9 - 10.3 mg/dL 9.4 9.4 9.0  Total Protein 6.5 - 8.1 g/dL 7.4 8.3(H) -  Total Bilirubin 0.3 - 1.2 mg/dL 0.3 0.2(L) -  Alkaline Phos 38 - 126 U/L 101 93 -  AST 15 - 41 U/L 15 21 -  ALT 0 - 44 U/L 15 20 -    Lab Results  Component Value Date   WBC 5.1 05/03/2019   HGB 12.2 05/03/2019   HCT 38.6 05/03/2019   MCV 86.9 05/03/2019   PLT 131 (L)  05/03/2019   NEUTROABS 2.0 05/03/2019    ASSESSMENT & PLAN:  Malignant neoplasm of upper-inner quadrant of left breast in female, estrogen receptor positive ([place]) 03/14/2019:Left lumpectomy [name]): IDC, 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Adjuvant chemotherapy with dose dense Adriamycin and Cytoxan x4 followed by Taxol x12 2.Followed by adjuvant radiation 3.Followed by adjuvant antiestrogen therapy No adverse effects due to participation in the upbeat clinical trial ----------------------------------------------------------- Current treatment: Cycle 2 dose dense Adriamycin and Cytoxan Echocardiogram 04/15/2019: EF 50 to 55%.  Chemo toxicities: Mild constipation.  Resolved with Dulcolax. Denies any nausea or vomiting. Felt remarkably well.  Return to clinic in 2 weeks for cycle 3    No orders of the defined types were placed in this encounter.  The patient has a good understanding of the overall plan. she agrees with it. she will call with any problems that may develop  before the next visit here.  Nicholas Lose, MD 05/10/2019  Julious Oka Dorshimer am acting as scribe for Dr. Nicholas Lose.  I have reviewed the above documentation for accuracy and completeness, and I agree with the above.

## 2019-05-10 ENCOUNTER — Inpatient Hospital Stay: Payer: Medicaid Other

## 2019-05-10 ENCOUNTER — Other Ambulatory Visit: Payer: Self-pay

## 2019-05-10 ENCOUNTER — Inpatient Hospital Stay (HOSPITAL_BASED_OUTPATIENT_CLINIC_OR_DEPARTMENT_OTHER): Payer: Medicaid Other | Admitting: Hematology and Oncology

## 2019-05-10 DIAGNOSIS — Z5111 Encounter for antineoplastic chemotherapy: Secondary | ICD-10-CM | POA: Diagnosis not present

## 2019-05-10 DIAGNOSIS — C50212 Malignant neoplasm of upper-inner quadrant of left female breast: Secondary | ICD-10-CM

## 2019-05-10 DIAGNOSIS — Z170 Estrogen receptor positive status [ER+]: Secondary | ICD-10-CM

## 2019-05-10 LAB — CMP (CANCER CENTER ONLY)
ALT: 16 U/L (ref 0–44)
AST: 17 U/L (ref 15–41)
Albumin: 4.1 g/dL (ref 3.5–5.0)
Alkaline Phosphatase: 112 U/L (ref 38–126)
Anion gap: 6 (ref 5–15)
BUN: 10 mg/dL (ref 6–20)
CO2: 26 mmol/L (ref 22–32)
Calcium: 9.2 mg/dL (ref 8.9–10.3)
Chloride: 107 mmol/L (ref 98–111)
Creatinine: 0.81 mg/dL (ref 0.44–1.00)
GFR, Est AFR Am: >60 mL/min (ref >60)
GFR, Estimated: >60 mL/min (ref >60)
Glucose, Bld: 109 mg/dL — ABNORMAL HIGH (ref 70–99)
Potassium: 4.4 mmol/L (ref 3.5–5.1)
Sodium: 139 mmol/L (ref 135–145)
Total Bilirubin: <0.2 mg/dL — ABNORMAL LOW (ref 0.3–1.2)
Total Protein: 7.4 g/dL (ref 6.5–8.1)

## 2019-05-10 LAB — CBC WITH DIFFERENTIAL (CANCER CENTER ONLY)
Abs Immature Granulocytes: 1.23 10*3/uL — ABNORMAL HIGH (ref 0.00–0.07)
Basophils Absolute: 0.1 10*3/uL (ref 0.0–0.1)
Basophils Relative: 1 %
Eosinophils Absolute: 0.1 10*3/uL (ref 0.0–0.5)
Eosinophils Relative: 0 %
HCT: 37.7 % (ref 36.0–46.0)
Hemoglobin: 11.7 g/dL — ABNORMAL LOW (ref 12.0–15.0)
Immature Granulocytes: 11 %
Lymphocytes Relative: 23 %
Lymphs Abs: 2.6 10*3/uL (ref 0.7–4.0)
MCH: 27.1 pg (ref 26.0–34.0)
MCHC: 31 g/dL (ref 30.0–36.0)
MCV: 87.5 fL (ref 80.0–100.0)
Monocytes Absolute: 0.9 10*3/uL (ref 0.1–1.0)
Monocytes Relative: 8 %
Neutro Abs: 6.6 10*3/uL (ref 1.7–7.7)
Neutrophils Relative %: 57 %
Platelet Count: 349 10*3/uL (ref 150–400)
RBC: 4.31 MIL/uL (ref 3.87–5.11)
RDW: 14.1 % (ref 11.5–15.5)
WBC Count: 11.5 10*3/uL — ABNORMAL HIGH (ref 4.0–10.5)
nRBC: 0 % (ref 0.0–0.2)

## 2019-05-10 MED ORDER — PALONOSETRON HCL INJECTION 0.25 MG/5ML
INTRAVENOUS | Status: AC
  Filled 2019-05-10: qty 5

## 2019-05-10 MED ORDER — HEPARIN SOD (PORK) LOCK FLUSH 100 UNIT/ML IV SOLN
500.0000 [IU] | Freq: Once | INTRAVENOUS | Status: AC | PRN
  Administered 2019-05-10: 500 [IU]
  Filled 2019-05-10: qty 5

## 2019-05-10 MED ORDER — SODIUM CHLORIDE 0.9% FLUSH
10.0000 mL | INTRAVENOUS | Status: DC | PRN
  Administered 2019-05-10: 10 mL via INTRAVENOUS
  Filled 2019-05-10: qty 10

## 2019-05-10 MED ORDER — SODIUM CHLORIDE 0.9 % IV SOLN
Freq: Once | INTRAVENOUS | Status: AC
  Administered 2019-05-10: 10:00:00 via INTRAVENOUS
  Filled 2019-05-10: qty 250

## 2019-05-10 MED ORDER — PEGFILGRASTIM 6 MG/0.6ML ~~LOC~~ PSKT
6.0000 mg | PREFILLED_SYRINGE | Freq: Once | SUBCUTANEOUS | Status: AC
  Administered 2019-05-10: 6 mg via SUBCUTANEOUS

## 2019-05-10 MED ORDER — DOXORUBICIN HCL CHEMO IV INJECTION 2 MG/ML
60.0000 mg/m2 | Freq: Once | INTRAVENOUS | Status: AC
  Administered 2019-05-10: 94 mg via INTRAVENOUS
  Filled 2019-05-10: qty 47

## 2019-05-10 MED ORDER — PEGFILGRASTIM 6 MG/0.6ML ~~LOC~~ PSKT
PREFILLED_SYRINGE | SUBCUTANEOUS | Status: AC
  Filled 2019-05-10: qty 0.6

## 2019-05-10 MED ORDER — SODIUM CHLORIDE 0.9% FLUSH
10.0000 mL | INTRAVENOUS | Status: DC | PRN
  Administered 2019-05-10: 10 mL
  Filled 2019-05-10: qty 10

## 2019-05-10 MED ORDER — PALONOSETRON HCL INJECTION 0.25 MG/5ML
0.2500 mg | Freq: Once | INTRAVENOUS | Status: AC
  Administered 2019-05-10: 0.25 mg via INTRAVENOUS

## 2019-05-10 MED ORDER — SODIUM CHLORIDE 0.9 % IV SOLN
600.0000 mg/m2 | Freq: Once | INTRAVENOUS | Status: AC
  Administered 2019-05-10: 940 mg via INTRAVENOUS
  Filled 2019-05-10: qty 47

## 2019-05-10 MED ORDER — SODIUM CHLORIDE 0.9 % IV SOLN
Freq: Once | INTRAVENOUS | Status: AC
  Administered 2019-05-10: 10:00:00 via INTRAVENOUS
  Filled 2019-05-10: qty 5

## 2019-05-10 NOTE — Patient Instructions (Signed)

## 2019-05-10 NOTE — Patient Instructions (Signed)
[facility] Discharge Instructions for Patients Receiving Chemotherapy  Today you received the following chemotherapy agents Adriamycin and Cytoxan  To help prevent nausea and vomiting after your treatment, we encourage you to take your nausea medication as directed.  If you develop nausea and vomiting that is not controlled by your nausea medication, call the clinic.   BELOW ARE SYMPTOMS THAT SHOULD BE REPORTED IMMEDIATELY:  *FEVER GREATER THAN 100.5 F  *CHILLS WITH OR WITHOUT FEVER  NAUSEA AND VOMITING THAT IS NOT CONTROLLED WITH YOUR NAUSEA MEDICATION  *UNUSUAL SHORTNESS OF BREATH  *UNUSUAL BRUISING OR BLEEDING  TENDERNESS IN MOUTH AND THROAT WITH OR WITHOUT PRESENCE OF ULCERS  *URINARY PROBLEMS  *BOWEL PROBLEMS  UNUSUAL RASH Items with * indicate a potential emergency and should be followed up as soon as possible.  Feel free to call the clinic should you have any questions or concerns. The clinic phone number is [phone].  Please show the CHEMO ALERT CARD at check-in to the Emergency Department and triage nurse.   

## 2019-05-10 NOTE — Assessment & Plan Note (Signed)
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Adjuvant chemotherapy with dose dense Adriamycin and Cytoxan x4 followed by Taxol x12 2.Followed by adjuvant radiation 3.Followed by adjuvant antiestrogen therapy No adverse effects due to participation in the upbeat clinical trial ----------------------------------------------------------- Current treatment: Cycle 2 dose dense Adriamycin and Cytoxan Echocardiogram 04/15/2019: EF 50 to 55%.  Chemo toxicities: Mild constipation.  Resolved with Dulcolax. Denies any nausea or vomiting. Felt remarkably well.  Return to clinic in 2 weeks for cycle 3

## 2019-05-12 ENCOUNTER — Ambulatory Visit: Payer: Medicaid Other

## 2019-05-20 ENCOUNTER — Encounter: Payer: Self-pay | Admitting: *Deleted

## 2019-05-23 NOTE — Progress Notes (Signed)
Patient Care Team: Tawny Asal as PCP - General (Physician Assistant) Mauro Kaufmann, RN as Oncology Nurse Navigator Rockwell Germany, RN as Oncology Nurse Navigator Alphonsa Overall, MD as Consulting Physician (General Surgery) Nicholas Lose, MD as Consulting Physician (Hematology and Oncology) Gery Pray, MD as Consulting Physician (Radiation Oncology)  DIAGNOSIS:    ICD-10-CM   1. Malignant neoplasm of upper-inner quadrant of left breast in female, estrogen receptor positive ([place])  C50.212    Z17.0     SUMMARY OF ONCOLOGIC HISTORY: Oncology History  Malignant neoplasm of upper-inner quadrant of left breast in female, estrogen receptor positive ([place])  02/07/2019 Initial Diagnosis   Diagnostic mammogram following palpable lump in the left breast x6 months showed 3.0cm mass at the 9 oc'clock position 2cm from the nipple. Biopsy confirmed grade 3 IDC, HER2 negative by FISH, ER+ (100%), PR+ (80%), Ki67 20%.    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    03/29/2019 Cancer Staging   Staging form: Breast, AJCC 8th Edition - Pathologic stage from 03/29/2019: Stage IIA (pT2, pN1a, cM0, G3, ER+, PR+, HER2-) - Signed by Nicholas Lose, MD on 03/29/2019   04/26/2019 -  Chemotherapy   Adj chemo with AC-T     CHIEF COMPLIANT: Cycle 3Adriamycin and Cytoxan  INTERVAL HISTORY: Linda Spears is a 58 y.o. with above-mentioned history of left breast cancer who underwent a lumpectomy and is currently on adjuvant chemotherapy treatment withdose dense Adriamycin and Cytoxan.She presents to the clinic todayforcycle 3.  She has experienced more fatigue  after the last cycle of chemo along with a decrease in appetite and taste.  The taste came back for the past week and she has been eating normally.  She had mild nausea but no vomiting.  REVIEW OF SYSTEMS:   Constitutional: Denies fevers, chills or abnormal weight loss Eyes: Denies blurriness of vision Ears, nose, mouth, throat, and face: Denies mucositis or sore throat Respiratory: Denies cough, dyspnea or wheezes Cardiovascular: Denies palpitation, chest discomfort Gastrointestinal: Mild nausea Skin: Denies abnormal skin rashes Lymphatics: Denies new lymphadenopathy or easy bruising Neurological: Denies numbness, tingling or new weaknesses Behavioral/Psych: Mood is stable, no new changes  Extremities: No lower extremity edema Breast: denies any pain or lumps or nodules in either breasts All other systems were reviewed with the patient and are negative.  I have reviewed the past medical history, past surgical history, social history and family history with the patient and they are unchanged from previous note.  ALLERGIES:  has No Known Allergies.  MEDICATIONS:  Current Outpatient Medications  Medication Sig Dispense Refill   acetaminophen (TYLENOL) 500 MG tablet Take 1,000 mg by mouth every 6 (six) hours as needed for moderate pain.     dexamethasone (DECADRON) 4 MG tablet Take 1 tablet day after chemo and 1 tablet 2 days after chemo with food 8 tablet 0   HYDROcodone-acetaminophen (NORCO/VICODIN) 5-325 MG tablet Take 1 tablet by mouth every 6 (six) hours as needed for moderate pain. 10 tablet 0   lidocaine-prilocaine (EMLA) cream Apply to affected area once 30 g 3   LORazepam (ATIVAN) 0.5 MG tablet Take 1 tablet (0.5 mg total)  by mouth at bedtime as needed for sleep. 30 tablet 0   ondansetron (ZOFRAN) 8 MG tablet Take 1 tablet (8 mg total) by mouth 2 (two) times daily as needed. Start on the third day after chemotherapy. 30 tablet 1   prochlorperazine (COMPAZINE) 10 MG tablet  Take 1 tablet (10 mg total) by mouth every 6 (six) hours as needed (Nausea or vomiting). 30 tablet 1   No current facility-administered medications for this visit.    Facility-Administered Medications Ordered in Other Visits  Medication Dose Route Frequency Provider Last Rate Last Dose   heparin lock flush 100 unit/mL  500 Units Intracatheter Once PRN Nicholas Lose, MD       sodium chloride flush (NS) 0.9 % injection 10 mL  10 mL Intracatheter PRN Nicholas Lose, MD        PHYSICAL EXAMINATION: ECOG PERFORMANCE STATUS: 1 - Symptomatic but completely ambulatory  Vitals:   05/24/19 0827  BP: 128/69  Pulse: 77  Resp: 18  Temp: 98.2 F (36.8 C)  SpO2: 100%   Filed Weights   05/24/19 0827  Weight: 124 lb (56.2 kg)    GENERAL: alert, no distress and comfortable SKIN: skin color, texture, turgor are normal, no rashes or significant lesions EYES: normal, Conjunctiva are pink and non-injected, sclera clear OROPHARYNX: no exudate, no erythema and lips, buccal mucosa, and tongue normal  NECK: supple, thyroid normal size, non-tender, without nodularity LYMPH: no palpable lymphadenopathy in the cervical, axillary or inguinal LUNGS: clear to auscultation and percussion with normal breathing effort HEART: regular rate & rhythm and no murmurs and no lower extremity edema ABDOMEN: abdomen soft, non-tender and normal bowel sounds MUSCULOSKELETAL: no cyanosis of digits and no clubbing  NEURO: alert & oriented x 3 with fluent speech, no focal motor/sensory deficits EXTREMITIES: No lower extremity edema  LABORATORY DATA:  I have reviewed the data as listed CMP Latest Ref Rng & Units 05/10/2019 05/03/2019 04/25/2019  Glucose 70 - 99 mg/dL 109(H) 95 94  BUN 6 - 20 mg/dL 10 11 12   Creatinine 0.44 - 1.00 mg/dL 0.81 0.75 0.86  Sodium 135 - 145 mmol/L 139 137 138  Potassium 3.5 - 5.1 mmol/L 4.4 4.7 4.1  Chloride 98 - 111 mmol/L 107 102 106  CO2 22 - 32 mmol/L 26 27 24   Calcium 8.9 - 10.3 mg/dL  9.2 9.4 9.4  Total Protein 6.5 - 8.1 g/dL 7.4 7.4 8.3(H)  Total Bilirubin 0.3 - 1.2 mg/dL <0.2(L) 0.3 0.2(L)  Alkaline Phos 38 - 126 U/L 112 101 93  AST 15 - 41 U/L 17 15 21   ALT 0 - 44 U/L 16 15 20     Lab Results  Component Value Date   WBC 11.3 (H) 05/24/2019   HGB 11.1 (L) 05/24/2019   HCT 34.9 (L) 05/24/2019   MCV 86.2 05/24/2019   PLT 278 05/24/2019   NEUTROABS PENDING 05/24/2019    ASSESSMENT & PLAN:  Malignant neoplasm of upper-inner quadrant of left breast in female, estrogen receptor positive ([name]) 03/14/2019:Left lumpectomy [name]): IDC, 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Adjuvant chemotherapy with dose dense Adriamycin and Cytoxan x4 followed by Taxol x12 2.Followed by adjuvant radiation 3.Followed by adjuvant antiestrogen therapy No adverse effects due to participation in the upbeat clinical trial ----------------------------------------------------------- Current treatment: Cycle 3dose dense Adriamycin and  Cytoxan Echocardiogram 04/15/2019: EF 50 to 55%.  Chemo toxicities: Mild constipation. Resolved with Dulcolax. Mild nausea Fatigue Alopecia Chemotherapy-induced anemia: Monitoring her hemoglobin  Return to clinic in 2 weeks for cycle 4    No orders of the defined types were placed in this encounter.  The patient has a good understanding of the overall plan. she agrees with it. she will call with any problems that may develop before the next visit here.  Nicholas Lose, MD 05/24/2019  Julious Oka Dorshimer am acting as scribe for Dr. Nicholas Lose.  I have reviewed the above documentation for accuracy and completeness, and I agree with the above.

## 2019-05-24 ENCOUNTER — Inpatient Hospital Stay: Payer: Medicaid Other

## 2019-05-24 ENCOUNTER — Other Ambulatory Visit: Payer: Self-pay

## 2019-05-24 ENCOUNTER — Inpatient Hospital Stay (HOSPITAL_BASED_OUTPATIENT_CLINIC_OR_DEPARTMENT_OTHER): Payer: Medicaid Other | Admitting: Hematology and Oncology

## 2019-05-24 DIAGNOSIS — C50212 Malignant neoplasm of upper-inner quadrant of left female breast: Secondary | ICD-10-CM

## 2019-05-24 DIAGNOSIS — Z95828 Presence of other vascular implants and grafts: Secondary | ICD-10-CM

## 2019-05-24 DIAGNOSIS — Z170 Estrogen receptor positive status [ER+]: Secondary | ICD-10-CM | POA: Diagnosis not present

## 2019-05-24 DIAGNOSIS — Z006 Encounter for examination for normal comparison and control in clinical research program: Secondary | ICD-10-CM

## 2019-05-24 DIAGNOSIS — Z5111 Encounter for antineoplastic chemotherapy: Secondary | ICD-10-CM | POA: Diagnosis not present

## 2019-05-24 LAB — CMP (CANCER CENTER ONLY)
ALT: 18 U/L (ref 0–44)
AST: 18 U/L (ref 15–41)
Albumin: 4.1 g/dL (ref 3.5–5.0)
Alkaline Phosphatase: 120 U/L (ref 38–126)
Anion gap: 8 (ref 5–15)
BUN: 8 mg/dL (ref 6–20)
CO2: 26 mmol/L (ref 22–32)
Calcium: 9.2 mg/dL (ref 8.9–10.3)
Chloride: 106 mmol/L (ref 98–111)
Creatinine: 0.8 mg/dL (ref 0.44–1.00)
GFR, Est AFR Am: >60 mL/min (ref >60)
GFR, Estimated: >60 mL/min (ref >60)
Glucose, Bld: 112 mg/dL — ABNORMAL HIGH (ref 70–99)
Potassium: 4.3 mmol/L (ref 3.5–5.1)
Sodium: 140 mmol/L (ref 135–145)
Total Bilirubin: <0.2 mg/dL — ABNORMAL LOW (ref 0.3–1.2)
Total Protein: 7.1 g/dL (ref 6.5–8.1)

## 2019-05-24 LAB — CBC WITH DIFFERENTIAL (CANCER CENTER ONLY)
Abs Immature Granulocytes: 0.85 10*3/uL — ABNORMAL HIGH (ref 0.00–0.07)
Basophils Absolute: 0.1 10*3/uL (ref 0.0–0.1)
Basophils Relative: 1 %
Eosinophils Absolute: 0 10*3/uL (ref 0.0–0.5)
Eosinophils Relative: 0 %
HCT: 34.9 % — ABNORMAL LOW (ref 36.0–46.0)
Hemoglobin: 11.1 g/dL — ABNORMAL LOW (ref 12.0–15.0)
Immature Granulocytes: 8 %
Lymphocytes Relative: 21 %
Lymphs Abs: 2.3 10*3/uL (ref 0.7–4.0)
MCH: 27.4 pg (ref 26.0–34.0)
MCHC: 31.8 g/dL (ref 30.0–36.0)
MCV: 86.2 fL (ref 80.0–100.0)
Monocytes Absolute: 1.1 10*3/uL — ABNORMAL HIGH (ref 0.1–1.0)
Monocytes Relative: 10 %
Neutro Abs: 6.9 10*3/uL (ref 1.7–7.7)
Neutrophils Relative %: 60 %
Platelet Count: 278 10*3/uL (ref 150–400)
RBC: 4.05 MIL/uL (ref 3.87–5.11)
RDW: 14.3 % (ref 11.5–15.5)
WBC Count: 11.3 10*3/uL — ABNORMAL HIGH (ref 4.0–10.5)
nRBC: 0 % (ref 0.0–0.2)

## 2019-05-24 LAB — RESEARCH LABS

## 2019-05-24 MED ORDER — DOXORUBICIN HCL CHEMO IV INJECTION 2 MG/ML
60.0000 mg/m2 | Freq: Once | INTRAVENOUS | Status: AC
  Administered 2019-05-24: 94 mg via INTRAVENOUS
  Filled 2019-05-24: qty 47

## 2019-05-24 MED ORDER — PEGFILGRASTIM 6 MG/0.6ML ~~LOC~~ PSKT
PREFILLED_SYRINGE | SUBCUTANEOUS | Status: AC
  Filled 2019-05-24: qty 0.6

## 2019-05-24 MED ORDER — SODIUM CHLORIDE 0.9 % IV SOLN
Freq: Once | INTRAVENOUS | Status: AC
  Administered 2019-05-24: 09:00:00 via INTRAVENOUS
  Filled 2019-05-24: qty 5

## 2019-05-24 MED ORDER — PALONOSETRON HCL INJECTION 0.25 MG/5ML
INTRAVENOUS | Status: AC
  Filled 2019-05-24: qty 5

## 2019-05-24 MED ORDER — SODIUM CHLORIDE 0.9 % IV SOLN
Freq: Once | INTRAVENOUS | Status: AC
  Administered 2019-05-24: 09:00:00 via INTRAVENOUS
  Filled 2019-05-24: qty 250

## 2019-05-24 MED ORDER — PEGFILGRASTIM 6 MG/0.6ML ~~LOC~~ PSKT
6.0000 mg | PREFILLED_SYRINGE | Freq: Once | SUBCUTANEOUS | Status: DC
  Administered 2019-05-24: 6 mg via SUBCUTANEOUS

## 2019-05-24 MED ORDER — SODIUM CHLORIDE 0.9 % IV SOLN
600.0000 mg/m2 | Freq: Once | INTRAVENOUS | Status: AC
  Administered 2019-05-24: 940 mg via INTRAVENOUS
  Filled 2019-05-24: qty 47

## 2019-05-24 MED ORDER — SODIUM CHLORIDE 0.9% FLUSH
10.0000 mL | INTRAVENOUS | Status: DC | PRN
  Administered 2019-05-24: 10 mL
  Filled 2019-05-24: qty 10

## 2019-05-24 MED ORDER — PALONOSETRON HCL INJECTION 0.25 MG/5ML
0.2500 mg | Freq: Once | INTRAVENOUS | Status: AC
  Administered 2019-05-24: 09:00:00 0.25 mg via INTRAVENOUS

## 2019-05-24 MED ORDER — SODIUM CHLORIDE 0.9% FLUSH
10.0000 mL | Freq: Once | INTRAVENOUS | Status: AC
  Administered 2019-05-24: 10 mL
  Filled 2019-05-24: qty 10

## 2019-05-24 MED ORDER — HEPARIN SOD (PORK) LOCK FLUSH 100 UNIT/ML IV SOLN
500.0000 [IU] | Freq: Once | INTRAVENOUS | Status: AC | PRN
  Administered 2019-05-24: 500 [IU]
  Filled 2019-05-24: qty 5

## 2019-05-24 NOTE — Assessment & Plan Note (Signed)
03/14/2019:Left lumpectomy [name]): IDC, 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Adjuvant chemotherapy with dose dense Adriamycin and Cytoxan x4 followed by Taxol x12 2.Followed by adjuvant radiation 3.Followed by adjuvant antiestrogen therapy No adverse effects due to participation in the upbeat clinical trial ----------------------------------------------------------- Current treatment: Cycle 3dose dense Adriamycin and Cytoxan Echocardiogram 04/15/2019: EF 50 to 55%.  Chemo toxicities: Mild constipation. Resolved with Dulcolax. Denies any nausea or vomiting. Felt remarkably well.  Return to clinic in 2 weeks for cycle 4

## 2019-05-24 NOTE — Patient Instructions (Signed)
[facility] Discharge Instructions for Patients Receiving Chemotherapy  Today you received the following chemotherapy agents Adriamycin and Cytoxan  To help prevent nausea and vomiting after your treatment, we encourage you to take your nausea medication as directed.  If you develop nausea and vomiting that is not controlled by your nausea medication, call the clinic.   BELOW ARE SYMPTOMS THAT SHOULD BE REPORTED IMMEDIATELY:  *FEVER GREATER THAN 100.5 F  *CHILLS WITH OR WITHOUT FEVER  NAUSEA AND VOMITING THAT IS NOT CONTROLLED WITH YOUR NAUSEA MEDICATION  *UNUSUAL SHORTNESS OF BREATH  *UNUSUAL BRUISING OR BLEEDING  TENDERNESS IN MOUTH AND THROAT WITH OR WITHOUT PRESENCE OF ULCERS  *URINARY PROBLEMS  *BOWEL PROBLEMS  UNUSUAL RASH Items with * indicate a potential emergency and should be followed up as soon as possible.  Feel free to call the clinic should you have any questions or concerns. The clinic phone number is [phone].  Please show the CHEMO ALERT CARD at check-in to the Emergency Department and triage nurse.   

## 2019-05-25 ENCOUNTER — Telehealth: Payer: Self-pay | Admitting: Hematology and Oncology

## 2019-05-25 ENCOUNTER — Telehealth: Payer: Self-pay | Admitting: *Deleted

## 2019-05-25 ENCOUNTER — Inpatient Hospital Stay: Payer: Medicaid Other

## 2019-05-25 ENCOUNTER — Other Ambulatory Visit: Payer: Self-pay | Admitting: Pharmacist

## 2019-05-25 VITALS — BP 129/77 | HR 80 | Temp 98.7°F | Resp 20

## 2019-05-25 DIAGNOSIS — C50212 Malignant neoplasm of upper-inner quadrant of left female breast: Secondary | ICD-10-CM

## 2019-05-25 DIAGNOSIS — Z170 Estrogen receptor positive status [ER+]: Secondary | ICD-10-CM

## 2019-05-25 DIAGNOSIS — Z5111 Encounter for antineoplastic chemotherapy: Secondary | ICD-10-CM | POA: Diagnosis not present

## 2019-05-25 MED ORDER — PEGFILGRASTIM INJECTION 6 MG/0.6ML ~~LOC~~
6.0000 mg | PREFILLED_SYRINGE | Freq: Once | SUBCUTANEOUS | Status: AC
  Administered 2019-05-25: 6 mg via SUBCUTANEOUS

## 2019-05-25 MED ORDER — PEGFILGRASTIM INJECTION 6 MG/0.6ML ~~LOC~~
PREFILLED_SYRINGE | SUBCUTANEOUS | Status: AC
  Filled 2019-05-25: qty 0.6

## 2019-05-25 MED ORDER — PEGFILGRASTIM INJECTION 6 MG/0.6ML ~~LOC~~
6.0000 mg | PREFILLED_SYRINGE | Freq: Once | SUBCUTANEOUS | Status: DC
  Filled 2019-05-25: qty 0.6

## 2019-05-25 NOTE — Telephone Encounter (Signed)
Verbal order received and read back from Dr. Lindi Adie  for Neulasta injection today. Scheduling order sent.  Patient notified transportation will arrive at 1601 and to bring On-Pro device.   "I am still wearing it.  I went to the bathroom, my undershirt under my pants was wet the size of a quarter.  It's still green but I did not receive all the medicine."

## 2019-05-25 NOTE — Telephone Encounter (Signed)
I talk with patient regarding schedule  

## 2019-05-26 ENCOUNTER — Ambulatory Visit: Payer: Medicaid Other

## 2019-06-06 NOTE — Progress Notes (Signed)
Patient Care Team: Tawny Asal as PCP - General (Physician Assistant) Mauro Kaufmann, RN as Oncology Nurse Navigator Rockwell Germany, RN as Oncology Nurse Navigator Alphonsa Overall, MD as Consulting Physician (General Surgery) Nicholas Lose, MD as Consulting Physician (Hematology and Oncology) Gery Pray, MD as Consulting Physician (Radiation Oncology)  DIAGNOSIS:    ICD-10-CM   1. Malignant neoplasm of upper-inner quadrant of left breast in female, estrogen receptor positive ([place])  C50.212    Z17.0     SUMMARY OF ONCOLOGIC HISTORY: Oncology History  Malignant neoplasm of upper-inner quadrant of left breast in female, estrogen receptor positive ([place])  02/07/2019 Initial Diagnosis   Diagnostic mammogram following palpable lump in the left breast x6 months showed 3.0cm mass at the 9 oc'clock position 2cm from the nipple. Biopsy confirmed grade 3 IDC, HER2 negative by FISH, ER+ (100%), PR+ (80%), Ki67 20%.    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    03/29/2019 Cancer Staging   Staging form: Breast, AJCC 8th Edition - Pathologic stage from 03/29/2019: Stage IIA (pT2, pN1a, cM0, G3, ER+, PR+, HER2-) - Signed by Nicholas Lose, MD on 03/29/2019   04/26/2019 -  Chemotherapy   Adj chemo with AC-T     CHIEF COMPLIANT: Cycle 4Adriamycin and Cytoxan  INTERVAL HISTORY: Linda Spears is a 58 y.o. with above-mentioned history of left breast cancer who underwent a lumpectomy and is currently on adjuvant chemotherapy treatment withdose dense Adriamycin and Cytoxan.She presents to the clinic todayforcycle 4.  Neulasta on pro leaked last time  and had to come in for a dose of Neulasta.  Otherwise she tolerated the treatment fairly well.  She had more days of fatigue.  Denied any nausea or vomiting.  REVIEW OF SYSTEMS:   Constitutional: Denies fevers, chills or abnormal weight loss, complains of fatigue Eyes: Denies blurriness of vision Ears, nose, mouth, throat, and face: Denies mucositis or sore throat Respiratory: Denies cough, dyspnea or wheezes Cardiovascular: Denies palpitation, chest discomfort Gastrointestinal: Denies nausea, heartburn or change in bowel habits Skin: Denies abnormal skin rashes Lymphatics: Denies new lymphadenopathy or easy bruising Neurological: Denies numbness, tingling or new weaknesses Behavioral/Psych: Mood is stable, no new changes  Extremities: No lower extremity edema Breast: denies any pain or lumps or nodules in either breasts All other systems were reviewed with the patient and are negative.  I have reviewed the past medical history, past surgical history, social history and family history with the patient and they are unchanged from previous note.  ALLERGIES:  has No Known Allergies.  MEDICATIONS:  Current Outpatient Medications  Medication Sig Dispense Refill  . acetaminophen (TYLENOL) 500 MG tablet Take 1,000 mg by mouth every 6 (six) hours as needed for moderate pain.    Marland Kitchen dexamethasone (DECADRON) 4 MG tablet Take 1 tablet day after chemo and 1 tablet 2 days after chemo with food 8 tablet 0  . HYDROcodone-acetaminophen (NORCO/VICODIN) 5-325 MG tablet Take 1 tablet by mouth every 6 (six) hours as needed for moderate pain. 10 tablet 0  . lidocaine-prilocaine (EMLA) cream Apply to affected area once 30 g 3  . LORazepam (ATIVAN) 0.5 MG tablet Take 1  tablet (0.5 mg total) by mouth at bedtime as needed for sleep. 30 tablet 0  . ondansetron (ZOFRAN) 8 MG tablet Take 1 tablet (8 mg total) by mouth 2 (two) times daily as needed. Start on the third day after chemotherapy. 30 tablet 1  .  prochlorperazine (COMPAZINE) 10 MG tablet Take 1 tablet (10 mg total) by mouth every 6 (six) hours as needed (Nausea or vomiting). 30 tablet 1   No current facility-administered medications for this visit.    Facility-Administered Medications Ordered in Other Visits  Medication Dose Route Frequency Provider Last Rate Last Dose  . pegfilgrastim (NEULASTA) injection 6 mg  6 mg Subcutaneous Once Nicholas Lose, MD        PHYSICAL EXAMINATION: ECOG PERFORMANCE STATUS: 1 - Symptomatic but completely ambulatory  Vitals:   06/07/19 0846  BP: 121/73  Pulse: (!) 57  Temp: 98.2 F (36.8 C)  SpO2: 100%   Filed Weights   06/07/19 0846  Weight: 119 lb 4.8 oz (54.1 kg)    GENERAL: alert, no distress and comfortable SKIN: skin color, texture, turgor are normal, no rashes or significant lesions EYES: normal, Conjunctiva are pink and non-injected, sclera clear OROPHARYNX: no exudate, no erythema and lips, buccal mucosa, and tongue normal  NECK: supple, thyroid normal size, non-tender, without nodularity LYMPH: no palpable lymphadenopathy in the cervical, axillary or inguinal LUNGS: clear to auscultation and percussion with normal breathing effort HEART: regular rate & rhythm and no murmurs and no lower extremity edema ABDOMEN: abdomen soft, non-tender and normal bowel sounds MUSCULOSKELETAL: no cyanosis of digits and no clubbing  NEURO: alert & oriented x 3 with fluent speech, no focal motor/sensory deficits EXTREMITIES: No lower extremity edema  LABORATORY DATA:  I have reviewed the data as listed CMP Latest Ref Rng & Units 05/24/2019 05/10/2019 05/03/2019  Glucose 70 - 99 mg/dL 112(H) 109(H) 95  BUN 6 - 20 mg/dL 8 10 11   Creatinine 0.44 - 1.00 mg/dL 0.80 0.81 0.75  Sodium 135 - 145 mmol/L 140 139 137  Potassium 3.5 - 5.1 mmol/L 4.3 4.4 4.7  Chloride 98 - 111 mmol/L 106 107 102  CO2 22 - 32 mmol/L 26 26 27   Calcium 8.9 - 10.3 mg/dL 9.2 9.2 9.4  Total Protein 6.5 - 8.1 g/dL 7.1 7.4 7.4   Total Bilirubin 0.3 - 1.2 mg/dL <0.2(L) <0.2(L) 0.3  Alkaline Phos 38 - 126 U/L 120 112 101  AST 15 - 41 U/L 18 17 15   ALT 0 - 44 U/L 18 16 15     Lab Results  Component Value Date   WBC 21.2 (H) 06/07/2019   HGB 10.1 (L) 06/07/2019   HCT 31.7 (L) 06/07/2019   MCV 86.1 06/07/2019   PLT 314 06/07/2019   NEUTROABS PENDING 06/07/2019    ASSESSMENT & PLAN:  Malignant neoplasm of upper-inner quadrant of left breast in female, estrogen receptor positive ([place]) 03/14/2019:Left lumpectomy [name]): IDC, 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Adjuvant chemotherapy with dose dense Adriamycin and Cytoxan x4 followed by Taxol x12 2.Followed by adjuvant radiation 3.Followed by adjuvant antiestrogen therapy No adverse effects due to participation in the upbeat clinical trial ----------------------------------------------------------- Current treatment: Cycle4dose dense Adriamycin and Cytoxan Echocardiogram 04/15/2019: EF 50 to 55%.  Chemo toxicities: Mild constipation. Resolved with Dulcolax. Mild nausea Fatigue Alopecia Chemotherapy-induced anemia:  Monitoring her hemoglobin Neulasta on pro leaked last time.  She had to receive a second dose.  Because of that she has leukocytosis today.  We will evaluate her for neuropathy clinical trial SWOG [id number] Return to clinic in2 weeks for cycle 1 Taxol    No orders of the defined types were placed in this encounter.  The patient has a good understanding of the overall plan. she agrees with it. she will call with any problems that may develop before the next visit here.  Nicholas Lose, MD 06/07/2019  Julious Oka Dorshimer am acting as scribe for Dr. Nicholas Lose.  I have reviewed the above documentation for accuracy and  completeness, and I agree with the above.

## 2019-06-07 ENCOUNTER — Encounter: Payer: Self-pay | Admitting: *Deleted

## 2019-06-07 ENCOUNTER — Telehealth: Payer: Self-pay | Admitting: *Deleted

## 2019-06-07 ENCOUNTER — Inpatient Hospital Stay: Payer: Medicaid Other

## 2019-06-07 ENCOUNTER — Inpatient Hospital Stay: Payer: Medicaid Other | Attending: Hematology and Oncology

## 2019-06-07 ENCOUNTER — Other Ambulatory Visit (HOSPITAL_COMMUNITY): Payer: Self-pay | Admitting: Hematology and Oncology

## 2019-06-07 ENCOUNTER — Inpatient Hospital Stay (HOSPITAL_BASED_OUTPATIENT_CLINIC_OR_DEPARTMENT_OTHER): Payer: Medicaid Other | Admitting: Hematology and Oncology

## 2019-06-07 ENCOUNTER — Other Ambulatory Visit: Payer: Self-pay

## 2019-06-07 ENCOUNTER — Other Ambulatory Visit: Payer: Self-pay | Admitting: *Deleted

## 2019-06-07 DIAGNOSIS — Z170 Estrogen receptor positive status [ER+]: Secondary | ICD-10-CM | POA: Diagnosis not present

## 2019-06-07 DIAGNOSIS — Z95828 Presence of other vascular implants and grafts: Secondary | ICD-10-CM

## 2019-06-07 DIAGNOSIS — C50212 Malignant neoplasm of upper-inner quadrant of left female breast: Secondary | ICD-10-CM

## 2019-06-07 DIAGNOSIS — Z5111 Encounter for antineoplastic chemotherapy: Secondary | ICD-10-CM | POA: Insufficient documentation

## 2019-06-07 DIAGNOSIS — Z5189 Encounter for other specified aftercare: Secondary | ICD-10-CM | POA: Insufficient documentation

## 2019-06-07 DIAGNOSIS — Z006 Encounter for examination for normal comparison and control in clinical research program: Secondary | ICD-10-CM

## 2019-06-07 LAB — CBC WITH DIFFERENTIAL (CANCER CENTER ONLY)
Abs Immature Granulocytes: 2.2 10*3/uL — ABNORMAL HIGH (ref 0.00–0.07)
Basophils Absolute: 0.1 10*3/uL (ref 0.0–0.1)
Basophils Relative: 0 %
Eosinophils Absolute: 0 10*3/uL (ref 0.0–0.5)
Eosinophils Relative: 0 %
HCT: 31.7 % — ABNORMAL LOW (ref 36.0–46.0)
Hemoglobin: 10.1 g/dL — ABNORMAL LOW (ref 12.0–15.0)
Immature Granulocytes: 10 %
Lymphocytes Relative: 12 %
Lymphs Abs: 2.4 10*3/uL (ref 0.7–4.0)
MCH: 27.4 pg (ref 26.0–34.0)
MCHC: 31.9 g/dL (ref 30.0–36.0)
MCV: 86.1 fL (ref 80.0–100.0)
Monocytes Absolute: 1.7 10*3/uL — ABNORMAL HIGH (ref 0.1–1.0)
Monocytes Relative: 8 %
Neutro Abs: 14.8 10*3/uL — ABNORMAL HIGH (ref 1.7–7.7)
Neutrophils Relative %: 70 %
Platelet Count: 314 10*3/uL (ref 150–400)
RBC: 3.68 MIL/uL — ABNORMAL LOW (ref 3.87–5.11)
RDW: 14.8 % (ref 11.5–15.5)
WBC Count: 21.2 10*3/uL — ABNORMAL HIGH (ref 4.0–10.5)
nRBC: 0.3 % — ABNORMAL HIGH (ref 0.0–0.2)

## 2019-06-07 LAB — CMP (CANCER CENTER ONLY)
ALT: 11 U/L (ref 0–44)
AST: 13 U/L — ABNORMAL LOW (ref 15–41)
Albumin: 3.8 g/dL (ref 3.5–5.0)
Alkaline Phosphatase: 124 U/L (ref 38–126)
Anion gap: 7 (ref 5–15)
BUN: 8 mg/dL (ref 6–20)
CO2: 26 mmol/L (ref 22–32)
Calcium: 8.8 mg/dL — ABNORMAL LOW (ref 8.9–10.3)
Chloride: 108 mmol/L (ref 98–111)
Creatinine: 0.79 mg/dL (ref 0.44–1.00)
GFR, Est AFR Am: >60 mL/min (ref >60)
GFR, Estimated: >60 mL/min (ref >60)
Glucose, Bld: 117 mg/dL — ABNORMAL HIGH (ref 70–99)
Potassium: 3.9 mmol/L (ref 3.5–5.1)
Sodium: 141 mmol/L (ref 135–145)
Total Bilirubin: <0.2 mg/dL — ABNORMAL LOW (ref 0.3–1.2)
Total Protein: 6.8 g/dL (ref 6.5–8.1)

## 2019-06-07 MED ORDER — SODIUM CHLORIDE 0.9% FLUSH
10.0000 mL | Freq: Once | INTRAVENOUS | Status: AC
  Administered 2019-06-07: 10 mL
  Filled 2019-06-07: qty 10

## 2019-06-07 MED ORDER — SODIUM CHLORIDE 0.9 % IV SOLN
Freq: Once | INTRAVENOUS | Status: AC
  Administered 2019-06-07: 10:00:00 via INTRAVENOUS
  Filled 2019-06-07: qty 5

## 2019-06-07 MED ORDER — PEGFILGRASTIM 6 MG/0.6ML ~~LOC~~ PSKT
PREFILLED_SYRINGE | SUBCUTANEOUS | Status: AC
  Filled 2019-06-07: qty 0.6

## 2019-06-07 MED ORDER — HEPARIN SOD (PORK) LOCK FLUSH 100 UNIT/ML IV SOLN
500.0000 [IU] | Freq: Once | INTRAVENOUS | Status: AC | PRN
  Administered 2019-06-07: 500 [IU]
  Filled 2019-06-07: qty 5

## 2019-06-07 MED ORDER — PALONOSETRON HCL INJECTION 0.25 MG/5ML
INTRAVENOUS | Status: AC
  Filled 2019-06-07: qty 5

## 2019-06-07 MED ORDER — PEGFILGRASTIM 6 MG/0.6ML ~~LOC~~ PSKT
6.0000 mg | PREFILLED_SYRINGE | Freq: Once | SUBCUTANEOUS | Status: AC
  Administered 2019-06-07: 6 mg via SUBCUTANEOUS

## 2019-06-07 MED ORDER — SODIUM CHLORIDE 0.9 % IV SOLN
Freq: Once | INTRAVENOUS | Status: AC
  Administered 2019-06-07: 10:00:00 via INTRAVENOUS
  Filled 2019-06-07: qty 250

## 2019-06-07 MED ORDER — SODIUM CHLORIDE 0.9% FLUSH
10.0000 mL | INTRAVENOUS | Status: DC | PRN
  Administered 2019-06-07: 10 mL
  Filled 2019-06-07: qty 10

## 2019-06-07 MED ORDER — SODIUM CHLORIDE 0.9 % IV SOLN
600.0000 mg/m2 | Freq: Once | INTRAVENOUS | Status: AC
  Administered 2019-06-07: 940 mg via INTRAVENOUS
  Filled 2019-06-07: qty 47

## 2019-06-07 MED ORDER — PALONOSETRON HCL INJECTION 0.25 MG/5ML
0.2500 mg | Freq: Once | INTRAVENOUS | Status: AC
  Administered 2019-06-07: 0.25 mg via INTRAVENOUS

## 2019-06-07 MED ORDER — DOXORUBICIN HCL CHEMO IV INJECTION 2 MG/ML
60.0000 mg/m2 | Freq: Once | INTRAVENOUS | Status: AC
  Administered 2019-06-07: 94 mg via INTRAVENOUS
  Filled 2019-06-07: qty 47

## 2019-06-07 NOTE — Research (Signed)
Upbeat [id number] Met with patient in infusion room to plan her 3 months Upbeat study visit which is due between 06/25/19 and 08/24/19.  Informed patient of study assessments including cardiac MRI, questionnaires, physical functions testing, neurocognitive testing, fasting labs, vital signs and medication review.  Patient agreed to schedule this visit after her chemotherapy appointment on 07/05/19.  Informed patient we will work on this schedule and will give her the appointment information when she is here again next week. Patient verbalized understanding. Thanked patient for her time today.  Cameo Windham, BSN, RN Clinical Research Nurse 06/07/2019 12:04 PM  

## 2019-06-07 NOTE — Research (Signed)
[id number], A PROSPECTIVE OBSERVATIONAL COHORT STUDY TO DEVELOP A PREDICTIVE MODEL OF TAXANE-INDUCED PERIPHERAL NEUROPATHY IN CANCER PATIENTS. CONSENT VISIT: Dr. Lindi Adie referred patient for the above study. Met with patient in infusion room for 45 minutes. Patient states Dr. Lindi Adie explained the study to her and she is interested in participating. Reviewed study consent and hippa forms with patient in their entirety. Explained the purpose of the study along with potential risks and benefits of participation.  Reviewed the study required assessments and timeline for completing these assessments. Informed patient that participation is completely voluntary and she may withdraw consent at any time. All patient's questions were answered and she agreed to participate in this study. Patient signed and dated the consent form for protocol version date 10/18/18, [place] Active Date 04/19/19 and hippa form dated 10/23/17. Patient did not agree to the optional blood samples for future research.  She did not agree to being contacted by the study for future research. Copies of signed/dated consent and hippa forms given to patient for her records.  1st Taxane infusion is scheduled to start in 2 weeks on 06/21/19.  Informed patient we will plan to have patient complete questionnaires prior to lab appointment, blood sample will be collected from [facility] during her regular lab appointment, research nurse will perform neuropathy assessments and collect other data for this baseline visit from patient prior to her infusion appointment. We will also arrange for research labs to be drawn during last 10 minutes of paclitaxel infusion. Research nurse to coordinate with infusion and lab. Patient verbalized understanding.  Thanked patient for agreeing to enroll this study today. Encouraged her to call clinic if any questions or concerns prior to next appointment. Gave patient my business card. She verbalized understanding.  Foye Spurling, BSN,  RN Clinical Research Nurse 06/07/2019 11:52 AM

## 2019-06-07 NOTE — Assessment & Plan Note (Signed)
03/14/2019:Left lumpectomy [name]): IDC, 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Adjuvant chemotherapy with dose dense Adriamycin and Cytoxan x4 followed by Taxol x12 2.Followed by adjuvant radiation 3.Followed by adjuvant antiestrogen therapy No adverse effects due to participation in the upbeat clinical trial ----------------------------------------------------------- Current treatment: Cycle4dose dense Adriamycin and Cytoxan Echocardiogram 04/15/2019: EF 50 to 55%.  Chemo toxicities: Mild constipation. Resolved with Dulcolax. Mild nausea Fatigue Alopecia Chemotherapy-induced anemia: Monitoring her hemoglobin  We will evaluate her for neuropathy clinical trial SWOG [id number] Return to clinic in2 weeks for cycle 1 Taxol

## 2019-06-07 NOTE — Telephone Encounter (Signed)
Spoke with patient during her 4th cycle A/C today.  She states she is doing well with no complaints. Encouraged her to call with any needs or concerns.

## 2019-06-07 NOTE — Patient Instructions (Signed)
[facility] Discharge Instructions for Patients Receiving Chemotherapy  Today you received the following chemotherapy agents Adriamycin and Cytoxan  To help prevent nausea and vomiting after your treatment, we encourage you to take your nausea medication as directed.  If you develop nausea and vomiting that is not controlled by your nausea medication, call the clinic.   BELOW ARE SYMPTOMS THAT SHOULD BE REPORTED IMMEDIATELY:  *FEVER GREATER THAN 100.5 F  *CHILLS WITH OR WITHOUT FEVER  NAUSEA AND VOMITING THAT IS NOT CONTROLLED WITH YOUR NAUSEA MEDICATION  *UNUSUAL SHORTNESS OF BREATH  *UNUSUAL BRUISING OR BLEEDING  TENDERNESS IN MOUTH AND THROAT WITH OR WITHOUT PRESENCE OF ULCERS  *URINARY PROBLEMS  *BOWEL PROBLEMS  UNUSUAL RASH Items with * indicate a potential emergency and should be followed up as soon as possible.  Feel free to call the clinic should you have any questions or concerns. The clinic phone number is [phone].  Please show the CHEMO ALERT CARD at check-in to the Emergency Department and triage nurse.   

## 2019-06-08 ENCOUNTER — Telehealth: Payer: Self-pay | Admitting: Hematology and Oncology

## 2019-06-08 NOTE — Telephone Encounter (Signed)
No los per 10/13 °

## 2019-06-09 ENCOUNTER — Ambulatory Visit: Payer: Medicaid Other

## 2019-06-15 ENCOUNTER — Telehealth: Payer: Self-pay | Admitting: *Deleted

## 2019-06-15 NOTE — Telephone Encounter (Signed)
Received call from pt stating she is experiencing swelling in her neck and chin on the same side her port a cath is on.  Pt states it is very tender to the touch.  Denies any redness, fever, or recent trama.  Per MD, pt to be seen in [facility].  High priority message sent to scheduling for pt to be seen in [facility] tomorrow AM. Pt verbalized understanding.

## 2019-06-15 NOTE — Telephone Encounter (Signed)
Upbeat Study; Patient called to reschedule her 3 months Upbeat visit. She decided she did not want to complete the MRI and other study activities after her treatment on Tuesday 07/05/19 as originally planned.  Patient would rather come in on a different day and prefers mornings. Patient rescheduled to 07/15/19 at 8 am for Research and 10 am for Cardiac MRI.  Informed patient that research nurse will give her updated schedule when she is in clinic next week. She verbalized understanding.  Foye Spurling, BSN, RN Clinical Research Nurse 06/15/2019 11:34 AM

## 2019-06-16 ENCOUNTER — Other Ambulatory Visit: Payer: Self-pay

## 2019-06-16 ENCOUNTER — Inpatient Hospital Stay (HOSPITAL_BASED_OUTPATIENT_CLINIC_OR_DEPARTMENT_OTHER): Payer: Medicaid Other | Admitting: Medical

## 2019-06-16 ENCOUNTER — Telehealth: Payer: Self-pay | Admitting: Medical

## 2019-06-16 VITALS — BP 106/70 | HR 62 | Temp 98.2°F | Resp 17 | Ht 62.0 in | Wt 119.7 lb

## 2019-06-16 DIAGNOSIS — Z170 Estrogen receptor positive status [ER+]: Secondary | ICD-10-CM | POA: Diagnosis not present

## 2019-06-16 DIAGNOSIS — Z5111 Encounter for antineoplastic chemotherapy: Secondary | ICD-10-CM | POA: Diagnosis not present

## 2019-06-16 DIAGNOSIS — C50212 Malignant neoplasm of upper-inner quadrant of left female breast: Secondary | ICD-10-CM

## 2019-06-16 DIAGNOSIS — H6121 Impacted cerumen, right ear: Secondary | ICD-10-CM

## 2019-06-16 NOTE — Progress Notes (Signed)
[facility]pt[facility] Clinic Progress Note   Linda Spears [id number] [birth year] 58 y.o.  Linda Spears is managed by Dr. Nicholas Lose  Actively treated with chemotherapy/immunotherapy/hormonal therapy: yes  Current therapy: Adriamycin and Cytoxan with Neulasta support  Last treated: 06/07/2019 (cycle 4, day 1)  Next scheduled appointment with provider: 06/21/2019  Assessment: Plan:    Malignant neoplasm of upper-inner quadrant of left breast in female, estrogen receptor positive ([place])  Impacted cerumen of right ear   ER positive malignant neoplasm of the left breast: The patient is status post cycle 4, day 1 of Adriamycin and Cytoxan with Neulasta support dosed on 06/07/2019 under the direction of Dr. Nicholas Lose.  She is scheduled to return on 06/21/2019.  Impacted cerumen of the right ear: The patient was told to obtain over-the-counter Debrox and use as directed.  Please see After Visit Summary for patient specific instructions.  Future Appointments  Date Time Provider [place]  06/21/2019  8:15 AM [facility] LAB 6 [facility] None  06/21/2019  8:30 AM [facility] [place] FLUSH [facility] None  06/21/2019  9:00 AM Nicholas Lose, MD [facility] None  06/21/2019  9:15 AM [place], Phonacelle C, RN [facility] None  06/21/2019  9:30 AM [facility] INFUSION [facility] None  06/28/2019  8:15 AM [facility] LAB 5 [facility] None  06/28/2019  8:30 AM [facility] [place] FLUSH [facility] None  06/28/2019  9:00 AM Nicholas Lose, MD [facility] None  06/28/2019 10:00 AM [facility] INFUSION [facility] None  07/05/2019  8:15 AM [facility] LAB 4 [facility] None  07/05/2019  8:45 AM [facility] [place] FLUSH [facility] None  07/05/2019  9:30 AM [facility] INFUSION [facility] None  07/15/2019  8:00 AM [place], Phonacelle C, RN [facility] None  07/15/2019 10:00 AM [facility] [place]    No orders of the defined types were placed in this encounter.      Subjective:    Patient ID:  Linda Spears is a 58 y.o. (DOB 1960/09/30) female.  Chief Complaint:  Chief Complaint  Patient presents with  . Neck Pain    HPI Linda Spears  Is a 58 y.o. female with a diagnosis of an ER positive malignant neoplasm of the left breast. She is status post cycle 4, day 1 of Adriamycin and Cytoxan with Neulasta support dosed on 06/07/2019 under the direction of Dr. Nicholas Lose.  She presents to the clinic today with report of swelling in her right lateral neck and chin over the past 1 to 2 days with a some mild sore throat.  She states that the swelling is better today.  The area and her right lateral neck was tender by her report.  She denies headaches, dysphagia, chest pain, shortness of breath, erythema, or increased warmth.  Medications: I have reviewed the patient's current medications.  Allergies: No Known Allergies  Past Medical History:  Diagnosis Date  . Cancer [facility])    left breast    Past Surgical History:  Procedure Laterality Date  . BREAST LUMPECTOMY WITH RADIOACTIVE SEED AND SENTINEL LYMPH NODE BIOPSY Left 03/22/2019   Procedure: LEFT BREAST LUMPECTOMY X2 WITH RADIOACTIVE SEED AND LEFT AXILLARY SENTINEL LYMPH NODE BIOPSY;  Surgeon: Alphonsa Overall, MD;  Location: [facility];  Service: General;  Laterality: Left;  . PORTACATH PLACEMENT N/A 04/19/2019   Procedure: INSERTION PORT-A-CATH WITH ULTRASOUND;  Surgeon: Alphonsa Overall, MD;  Location[facility];  Service: General;  Laterality: N/A;  . TONSILLECTOMY      Family History  Problem Relation Age of Onset  .  Hypertension Mother   . Migraines Mother   . Breast cancer Sister 76    Social History   Socioeconomic History  . Marital status: Legally Separated    Spouse name: Not on file  . Number of children: 3  . Years of education: Not on file  . Highest education level: Bachelor's degree (e.g., BA, AB, BS)  Occupational History  . Not on file  Social Needs  . Financial resource strain: Not on file  .  Food insecurity    Worry: Not on file    Inability: Not on file  . Transportation needs    Medical: Yes    Non-medical: Yes  Tobacco Use  . Smoking status: Current Every Day Smoker    Packs/day: 0.25    Types: Cigarettes  . Smokeless tobacco: Never Used  Substance and Sexual Activity  . Alcohol use: Yes    Comment: social  . Drug use: No  . Sexual activity: Not Currently  Lifestyle  . Physical activity    Days per week: Not on file    Minutes per session: Not on file  . Stress: Not on file  Relationships  . Social Herbalist on phone: Not on file    Gets together: Not on file    Attends religious service: Not on file    Active member of club or organization: Not on file    Attends meetings of clubs or organizations: Not on file    Relationship status: Not on file  . Intimate partner violence    Fear of current or ex partner: Not on file    Emotionally abused: Not on file    Physically abused: Not on file    Forced sexual activity: Not on file  Other Topics Concern  . Not on file  Social History Narrative  . Not on file    Past Medical History, Surgical history, Social history, and Family history were reviewed and updated as appropriate.   Please see review of systems for further details on the patient's review from today.   Review of Systems:  Review of Systems  Constitutional: Negative for activity change, appetite change, chills, diaphoresis, fatigue, fever and unexpected weight change.  HENT: Positive for sore throat. Negative for postnasal drip, rhinorrhea, sinus pressure, sinus pain and trouble swallowing.   Respiratory: Negative for cough, choking and shortness of breath.   Cardiovascular: Negative for chest pain.  Gastrointestinal: Negative for nausea and vomiting.  Musculoskeletal: Positive for neck pain.    Objective:   Physical Exam:  BP 106/70 (BP Location: Left Arm, Patient Position: Sitting)   Pulse 62   Temp 98.2 F (36.8 C)  (Temporal)   Resp 17   Ht 5\' 2"  (1.575 m)   Wt 119 lb 11.2 oz (54.3 kg)   SpO2 100%   BMI 21.89 kg/m  ECOG: 0  Physical Exam Constitutional:      General: She is not in acute distress.    Appearance: She is not diaphoretic.  HENT:     Head: Normocephalic and atraumatic.     Right Ear: External ear normal. There is impacted cerumen.     Left Ear: Tympanic membrane, ear canal and external ear normal. There is no impacted cerumen.     Mouth/Throat:     Mouth: Mucous membranes are dry.     Pharynx: Oropharynx is clear. No oropharyngeal exudate or posterior oropharyngeal erythema.  Eyes:     General: No scleral icterus.  Right eye: No discharge.        Left eye: No discharge.     Conjunctiva/sclera: Conjunctivae normal.  Neck:     Musculoskeletal: No muscular tenderness.  Cardiovascular:     Rate and Rhythm: Normal rate and regular rhythm.     Heart sounds: Normal heart sounds. No murmur. No friction rub. No gallop.   Pulmonary:     Effort: Pulmonary effort is normal. No respiratory distress.     Breath sounds: Normal breath sounds. No wheezing or rales.  Lymphadenopathy:     Cervical: No cervical adenopathy.  Skin:    General: Skin is warm and dry.     Findings: No erythema or rash.  Neurological:     Mental Status: She is alert.  Psychiatric:        Mood and Affect: Mood normal.        Behavior: Behavior normal.        Thought Content: Thought content normal.        Judgment: Judgment normal.     Lab Review:     Component Value Date/Time   NA 141 06/07/2019 0756   NA 141 11/13/2016 1621   K 3.9 06/07/2019 0756   CL 108 06/07/2019 0756   CO2 26 06/07/2019 0756   GLUCOSE 117 (H) 06/07/2019 0756   BUN 8 06/07/2019 0756   BUN 9 11/13/2016 1621   CREATININE 0.79 06/07/2019 0756   CALCIUM 8.8 (L) 06/07/2019 0756   PROT 6.8 06/07/2019 0756   PROT 8.2 11/13/2016 1621   ALBUMIN 3.8 06/07/2019 0756   ALBUMIN 4.6 11/13/2016 1621   AST 13 (L) 06/07/2019 0756    ALT 11 06/07/2019 0756   ALKPHOS 124 06/07/2019 0756   BILITOT <0.2 (L) 06/07/2019 0756   GFRNONAA >60 06/07/2019 0756   GFRAA >60 06/07/2019 0756       Component Value Date/Time   WBC 21.2 (H) 06/07/2019 0756   WBC 5.9 04/13/2019 0850   RBC 3.68 (L) 06/07/2019 0756   HGB 10.1 (L) 06/07/2019 0756   HGB 13.4 11/13/2016 1621   HCT 31.7 (L) 06/07/2019 0756   HCT 42.0 11/13/2016 1621   PLT 314 06/07/2019 0756   MCV 86.1 06/07/2019 0756   MCV 87 11/13/2016 1621   MCH 27.4 06/07/2019 0756   MCHC 31.9 06/07/2019 0756   RDW 14.8 06/07/2019 0756   RDW 15.0 11/13/2016 1621   LYMPHSABS 2.4 06/07/2019 0756   LYMPHSABS 2.8 11/13/2016 1621   MONOABS 1.7 (H) 06/07/2019 0756   EOSABS 0.0 06/07/2019 0756   EOSABS 0.3 11/13/2016 1621   BASOSABS 0.1 06/07/2019 0756   BASOSABS 0.0 11/13/2016 1621   -------------------------------  Imaging from last 24 hours (if applicable):  Radiology interpretation: No results found.

## 2019-06-16 NOTE — Patient Instructions (Signed)
Debrox, over-the-counter, use as directed per box

## 2019-06-16 NOTE — Telephone Encounter (Signed)
No los per 10/22. °

## 2019-06-20 NOTE — Progress Notes (Signed)
Patient Care Team: Tawny Asal as PCP - General (Physician Assistant) Mauro Kaufmann, RN as Oncology Nurse Navigator Rockwell Germany, RN as Oncology Nurse Navigator Alphonsa Overall, MD as Consulting Physician (General Surgery) Nicholas Lose, MD as Consulting Physician (Hematology and Oncology) Gery Pray, MD as Consulting Physician (Radiation Oncology)  DIAGNOSIS:    ICD-10-CM   1. Malignant neoplasm of upper-inner quadrant of left breast in female, estrogen receptor positive ([place])  C50.212    Z17.0     SUMMARY OF ONCOLOGIC HISTORY: Oncology History  Malignant neoplasm of upper-inner quadrant of left breast in female, estrogen receptor positive ([place])  02/07/2019 Initial Diagnosis   Diagnostic mammogram following palpable lump in the left breast x6 months showed 3.0cm mass at the 9 oc'clock position 2cm from the nipple. Biopsy confirmed grade 3 IDC, HER2 negative by FISH, ER+ (100%), PR+ (80%), Ki67 20%.    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    03/29/2019 Cancer Staging   Staging form: Breast, AJCC 8th Edition - Pathologic stage from 03/29/2019: Stage IIA (pT2, pN1a, cM0, G3, ER+, PR+, HER2-) - Signed by Nicholas Lose, MD on 03/29/2019   04/26/2019 -  Chemotherapy   Adj chemo with AC-T     CHIEF COMPLIANT: Cycle 1 Taxol  INTERVAL HISTORY: Linda Spears is a 58 y.o. with above-mentioned history of breast cancer who underwent a lumpectomy and is currently on adjuvant chemotherapy treatment. She completed 4 cycles of dose dense Adriamycin and Cytoxan and is currently receiving weekly Taxol treatments.She presents to the clinic  todayforcycle1.  Other than mild fatigue she is done very well from treatment.  REVIEW OF SYSTEMS:   Constitutional: Denies fevers, chills or abnormal weight loss Eyes: Denies blurriness of vision Ears, nose, mouth, throat, and face: Denies mucositis or sore throat Respiratory: Denies cough, dyspnea or wheezes Cardiovascular: Denies palpitation, chest discomfort Gastrointestinal: Denies nausea, heartburn or change in bowel habits Skin: Denies abnormal skin rashes Lymphatics: Denies new lymphadenopathy or easy bruising Neurological: Denies numbness, tingling or new weaknesses Behavioral/Psych: Mood is stable, no new changes  Extremities: No lower extremity edema Breast: denies any pain or lumps or nodules in either breasts All other systems were reviewed with the patient and are negative.  I have reviewed the past medical history, past surgical history, social history and family history with the patient and they are unchanged from previous note.  ALLERGIES:  has No Known Allergies.  MEDICATIONS:  Current Outpatient Medications  Medication Sig Dispense Refill  . acetaminophen (TYLENOL) 500 MG tablet Take 1,000 mg by mouth every 6 (six) hours as needed for moderate pain.    Marland Kitchen lidocaine-prilocaine (EMLA) cream Apply to affected area once 30 g 3  . LORazepam (ATIVAN) 0.5 MG tablet Take 1 tablet (0.5 mg total) by mouth at bedtime as needed for sleep. 30 tablet 0  . ondansetron (ZOFRAN) 8 MG tablet Take 1 tablet (8 mg total) by mouth 2 (two) times daily as needed. Start on the third day after chemotherapy. 30 tablet 1  . prochlorperazine (COMPAZINE) 10 MG tablet Take 1 tablet (10 mg total) by mouth every 6 (six) hours as needed (Nausea or  vomiting). 30 tablet 1   No current facility-administered medications for this visit.    Facility-Administered Medications Ordered in Other Visits  Medication Dose Route Frequency Provider Last Rate Last Dose  . 0.9 %  sodium chloride infusion    Intravenous Once Nicholas Lose, MD      . heparin lock flush 100 unit/mL  500 Units Intracatheter Once PRN Nicholas Lose, MD      . pegfilgrastim (NEULASTA) injection 6 mg  6 mg Subcutaneous Once Nicholas Lose, MD      . sodium chloride flush (NS) 0.9 % injection 10 mL  10 mL Intracatheter PRN Nicholas Lose, MD        PHYSICAL EXAMINATION: ECOG PERFORMANCE STATUS: 1 - Symptomatic but completely ambulatory  Vitals:   06/21/19 0855  BP: 112/64  Pulse: (!) 59  Resp: 16  Temp: 97.8 F (36.6 C)  SpO2: 100%   Filed Weights   06/21/19 0855  Weight: 120 lb 14.4 oz (54.8 kg)    GENERAL: alert, no distress and comfortable SKIN: skin color, texture, turgor are normal, no rashes or significant lesions EYES: normal, Conjunctiva are pink and non-injected, sclera clear OROPHARYNX: no exudate, no erythema and lips, buccal mucosa, and tongue normal  NECK: supple, thyroid normal size, non-tender, without nodularity LYMPH: no palpable lymphadenopathy in the cervical, axillary or inguinal LUNGS: clear to auscultation and percussion with normal breathing effort HEART: regular rate & rhythm and no murmurs and no lower extremity edema ABDOMEN: abdomen soft, non-tender and normal bowel sounds MUSCULOSKELETAL: no cyanosis of digits and no clubbing  NEURO: alert & oriented x 3 with fluent speech, no focal motor/sensory deficits EXTREMITIES: No lower extremity edema  LABORATORY DATA:  I have reviewed the data as listed CMP Latest Ref Rng & Units 06/21/2019 06/07/2019 05/24/2019  Glucose 70 - 99 mg/dL 104(H) 117(H) 112(H)  BUN 6 - 20 mg/dL 4(L) 8 8  Creatinine 0.44 - 1.00 mg/dL 0.71 0.79 0.80  Sodium 135 - 145 mmol/L 140 141 140  Potassium 3.5 - 5.1 mmol/L 3.8 3.9 4.3  Chloride 98 - 111 mmol/L 105 108 106  CO2 22 - 32 mmol/L 25 26 26   Calcium 8.9 - 10.3 mg/dL 9.0 8.8(L) 9.2  Total Protein 6.5 - 8.1 g/dL 7.1 6.8 7.1  Total Bilirubin 0.3 - 1.2 mg/dL <0.2(L) <0.2(L) <0.2(L)  Alkaline Phos 38 - 126  U/L 119 124 120  AST 15 - 41 U/L 12(L) 13(L) 18  ALT 0 - 44 U/L 9 11 18     Lab Results  Component Value Date   WBC 11.3 (H) 06/21/2019   HGB 8.9 (L) 06/21/2019   HCT 28.0 (L) 06/21/2019   MCV 85.6 06/21/2019   PLT 371 06/21/2019   NEUTROABS 7.1 06/21/2019    ASSESSMENT & PLAN:  Malignant neoplasm of upper-inner quadrant of left breast in female, estrogen receptor positive ([place]) 03/14/2019:Left lumpectomy [name]): IDC, 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Adjuvant chemotherapy with dose dense Adriamycin and Cytoxan x4 followed by Taxol x12 2.Followed by adjuvant radiation 3.Followed by adjuvant antiestrogen therapy No adverse effects due to participation in the upbeat clinical trial ----------------------------------------------------------- Current treatment: Completed 4 cycles ofdose dense Adriamycin and Cytoxan, today is cycle 1 Taxol Echocardiogram 04/15/2019: EF 50 to 55%.  Chemo toxicities: Mild constipation. Resolved with Dulcolax. Mild nausea  Fatigue Alopecia Chemotherapy-induced anemia: Monitoring her hemoglobin.  Today's hemoglobin is 8.9.    Patient consented to clinical trial SWOG 1714 to prospectively evaluate for peripheral neuropathy. Return to clinic weekly for Taxol.  We will see her next week to monitor for adverse effects.    No orders of the defined types were placed in this encounter.  The patient has a good understanding of the overall plan. she agrees with it. she will call with any problems that may develop before the next visit here.  Nicholas Lose, MD 06/21/2019  Julious Oka Dorshimer am acting as scribe for Dr. Nicholas Lose.  I have reviewed the above documentation for accuracy and completeness, and I agree with the  above.

## 2019-06-21 ENCOUNTER — Inpatient Hospital Stay: Payer: Medicaid Other

## 2019-06-21 ENCOUNTER — Inpatient Hospital Stay (HOSPITAL_BASED_OUTPATIENT_CLINIC_OR_DEPARTMENT_OTHER): Payer: Medicaid Other | Admitting: Hematology and Oncology

## 2019-06-21 ENCOUNTER — Inpatient Hospital Stay: Payer: Medicaid Other | Admitting: *Deleted

## 2019-06-21 ENCOUNTER — Other Ambulatory Visit: Payer: Self-pay

## 2019-06-21 VITALS — BP 109/64 | HR 61 | Temp 98.9°F | Resp 16

## 2019-06-21 DIAGNOSIS — Z95828 Presence of other vascular implants and grafts: Secondary | ICD-10-CM

## 2019-06-21 DIAGNOSIS — Z170 Estrogen receptor positive status [ER+]: Secondary | ICD-10-CM

## 2019-06-21 DIAGNOSIS — C50212 Malignant neoplasm of upper-inner quadrant of left female breast: Secondary | ICD-10-CM | POA: Diagnosis not present

## 2019-06-21 DIAGNOSIS — Z006 Encounter for examination for normal comparison and control in clinical research program: Secondary | ICD-10-CM

## 2019-06-21 DIAGNOSIS — Z5111 Encounter for antineoplastic chemotherapy: Secondary | ICD-10-CM | POA: Diagnosis not present

## 2019-06-21 LAB — CMP (CANCER CENTER ONLY)
ALT: 9 U/L (ref 0–44)
AST: 12 U/L — ABNORMAL LOW (ref 15–41)
Albumin: 3.6 g/dL (ref 3.5–5.0)
Alkaline Phosphatase: 119 U/L (ref 38–126)
Anion gap: 10 (ref 5–15)
BUN: 4 mg/dL — ABNORMAL LOW (ref 6–20)
CO2: 25 mmol/L (ref 22–32)
Calcium: 9 mg/dL (ref 8.9–10.3)
Chloride: 105 mmol/L (ref 98–111)
Creatinine: 0.71 mg/dL (ref 0.44–1.00)
GFR, Est AFR Am: >60 mL/min (ref >60)
GFR, Estimated: >60 mL/min (ref >60)
Glucose, Bld: 104 mg/dL — ABNORMAL HIGH (ref 70–99)
Potassium: 3.8 mmol/L (ref 3.5–5.1)
Sodium: 140 mmol/L (ref 135–145)
Total Bilirubin: <0.2 mg/dL — ABNORMAL LOW (ref 0.3–1.2)
Total Protein: 7.1 g/dL (ref 6.5–8.1)

## 2019-06-21 LAB — CBC WITH DIFFERENTIAL (CANCER CENTER ONLY)
Abs Immature Granulocytes: 0.5 10*3/uL — ABNORMAL HIGH (ref 0.00–0.07)
Basophils Absolute: 0.1 10*3/uL (ref 0.0–0.1)
Basophils Relative: 1 %
Eosinophils Absolute: 0 10*3/uL (ref 0.0–0.5)
Eosinophils Relative: 0 %
HCT: 28 % — ABNORMAL LOW (ref 36.0–46.0)
Hemoglobin: 8.9 g/dL — ABNORMAL LOW (ref 12.0–15.0)
Immature Granulocytes: 4 %
Lymphocytes Relative: 16 %
Lymphs Abs: 1.8 10*3/uL (ref 0.7–4.0)
MCH: 27.2 pg (ref 26.0–34.0)
MCHC: 31.8 g/dL (ref 30.0–36.0)
MCV: 85.6 fL (ref 80.0–100.0)
Monocytes Absolute: 1.8 10*3/uL — ABNORMAL HIGH (ref 0.1–1.0)
Monocytes Relative: 16 %
Neutro Abs: 7.1 10*3/uL (ref 1.7–7.7)
Neutrophils Relative %: 63 %
Platelet Count: 371 10*3/uL (ref 150–400)
RBC: 3.27 MIL/uL — ABNORMAL LOW (ref 3.87–5.11)
RDW: 15.9 % — ABNORMAL HIGH (ref 11.5–15.5)
WBC Count: 11.3 10*3/uL — ABNORMAL HIGH (ref 4.0–10.5)
nRBC: 0 % (ref 0.0–0.2)

## 2019-06-21 LAB — RESEARCH LABS

## 2019-06-21 MED ORDER — SODIUM CHLORIDE 0.9% FLUSH
10.0000 mL | Freq: Once | INTRAVENOUS | Status: AC
  Administered 2019-06-21: 10 mL
  Filled 2019-06-21: qty 10

## 2019-06-21 MED ORDER — FAMOTIDINE IN NACL 20-0.9 MG/50ML-% IV SOLN
20.0000 mg | Freq: Once | INTRAVENOUS | Status: AC
  Administered 2019-06-21: 20 mg via INTRAVENOUS

## 2019-06-21 MED ORDER — FAMOTIDINE IN NACL 20-0.9 MG/50ML-% IV SOLN
INTRAVENOUS | Status: AC
  Filled 2019-06-21: qty 50

## 2019-06-21 MED ORDER — SODIUM CHLORIDE 0.9% FLUSH
10.0000 mL | INTRAVENOUS | Status: DC | PRN
  Administered 2019-06-21: 10 mL
  Filled 2019-06-21: qty 10

## 2019-06-21 MED ORDER — HEPARIN SOD (PORK) LOCK FLUSH 100 UNIT/ML IV SOLN
500.0000 [IU] | Freq: Once | INTRAVENOUS | Status: AC | PRN
  Administered 2019-06-21: 500 [IU]
  Filled 2019-06-21: qty 5

## 2019-06-21 MED ORDER — DIPHENHYDRAMINE HCL 50 MG/ML IJ SOLN
50.0000 mg | Freq: Once | INTRAMUSCULAR | Status: AC
  Administered 2019-06-21: 50 mg via INTRAVENOUS

## 2019-06-21 MED ORDER — SODIUM CHLORIDE 0.9 % IV SOLN
Freq: Once | INTRAVENOUS | Status: AC
  Administered 2019-06-21: 10:00:00 via INTRAVENOUS
  Filled 2019-06-21: qty 250

## 2019-06-21 MED ORDER — SODIUM CHLORIDE 0.9 % IV SOLN
20.0000 mg | Freq: Once | INTRAVENOUS | Status: AC
  Administered 2019-06-21: 20 mg via INTRAVENOUS
  Filled 2019-06-21: qty 20

## 2019-06-21 MED ORDER — SODIUM CHLORIDE 0.9 % IV SOLN
80.0000 mg/m2 | Freq: Once | INTRAVENOUS | Status: AC
  Administered 2019-06-21: 126 mg via INTRAVENOUS
  Filled 2019-06-21: qty 21

## 2019-06-21 MED ORDER — DIPHENHYDRAMINE HCL 50 MG/ML IJ SOLN
INTRAMUSCULAR | Status: AC
  Filled 2019-06-21: qty 1

## 2019-06-21 NOTE — Patient Instructions (Signed)
[facility] Discharge Instructions for Patients Receiving Chemotherapy  Today you received the following chemotherapy agents :  Paclitaxel.  To help prevent nausea and vomiting after your treatment, we encourage you to take your nausea medication as prescribed , and as instructed by Dr. Jana Hakim.   If you develop nausea and vomiting that is not controlled by your nausea medication, call the clinic.   BELOW ARE SYMPTOMS THAT SHOULD BE REPORTED IMMEDIATELY:  *FEVER GREATER THAN 100.5 F  *CHILLS WITH OR WITHOUT FEVER  NAUSEA AND VOMITING THAT IS NOT CONTROLLED WITH YOUR NAUSEA MEDICATION  *UNUSUAL SHORTNESS OF BREATH  *UNUSUAL BRUISING OR BLEEDING  TENDERNESS IN MOUTH AND THROAT WITH OR WITHOUT PRESENCE OF ULCERS  *URINARY PROBLEMS  *BOWEL PROBLEMS  UNUSUAL RASH Items with * indicate a potential emergency and should be followed up as soon as possible.  Feel free to call the clinic should you have any questions or concerns. The clinic phone number is [phone].  Please show the [facility] at check-in to the Emergency Department and triage nurse.

## 2019-06-21 NOTE — Assessment & Plan Note (Signed)
03/14/2019:Left lumpectomy [name]): IDC, 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Adjuvant chemotherapy with dose dense Adriamycin and Cytoxan x4 followed by Taxol x12 2.Followed by adjuvant radiation 3.Followed by adjuvant antiestrogen therapy No adverse effects due to participation in the upbeat clinical trial ----------------------------------------------------------- Current treatment: Completed 4 cycles ofdose dense Adriamycin and Cytoxan, today is cycle 1 Taxol Echocardiogram 04/15/2019: EF 50 to 55%.  Chemo toxicities: Mild constipation. Resolved with Dulcolax. Mild nausea Fatigue Alopecia Chemotherapy-induced anemia: Monitoring her hemoglobin    Patient consented to clinical trial SWOG 1714 to prospectively evaluate for peripheral neuropathy. Return to clinic weekly for Taxol.  We will see her next week to monitor for adverse effects.

## 2019-06-21 NOTE — Research (Signed)
[id number], A PROSPECTIVE OBSERVATIONAL COHORT STUDY TO DEVELOP A PREDICTIVE MODEL OF TAXANE-INDUCED PERIPHERAL NEUROPATHY IN CANCER PATIENTS. BASELINE AND 1ST TAXANE VISIT: PROs; Questionnaires were given to patient to complete in clinic prior to registration. Collected questionnaires and checked for completeness and accuracy.  Registration; Patient meets all eligibility requirements to be enrolled on the S1714 study.  Eligibility also confirmed by another research RN, Jeral Fruit. Dr. Lindi Adie agrees patient meets eligiblity and patient was registered to the [phone] study via Open and assigned patient ID [id number]. Labs; Baseline mandatory and optional research labs were collected per protocol along with other standard of care labs ordered by provider, including serum creatinine level. History of Falls; Patient denied any falls in the past 6 months.  Medical and Smoking History; Reviewed with patient and CRFs completed.  Assessment for Interventions for CIPN; Reviewed with patient and CRFs completed. Neuropen Assessment; Completed per protocol by this certified research RN and assisted by Doristine Johns, RN. Marland Kitchen  [place] Assessment; Completed per protocol by this certified research RN and assisted by Doristine Johns, RN. .  Timed Get Up and Go Test; Completed per protocol by this trained research RN and assisted by Doristine Johns, RN. Marland Kitchen  Physician Assessments; CTCAE and Treatment [place]den forms completed and signed by Dr. Lindi Adie.  1st Taxane;  Mandatory and optional research labs drawn during last 10 minutes of pacitaxel infusion. Research nurse coordinated with nurses Dina Rich and Lenox Ponds for this blood draw.  Pacitaxel infusion start time; 11:55 am  Stop time;  1:31 pm Time of Blood Draw; 1:23 pm by Lenox Ponds, LPN. Blood sample was obtained peripherally from right arm.  Plan; Informed patient of next study assessments in 4 weeks and will be done on same day as treatment.  Plan for this visit on  07/19/19.  Thanked patient for participating in this study today. Encouraged her to call clinic if any questions or concerns prior to next appointment. She verbalized understanding.  Foye Spurling, BSN, RN Clinical Research Nurse 06/21/2019 2:06 PM

## 2019-06-23 ENCOUNTER — Telehealth: Payer: Self-pay | Admitting: *Deleted

## 2019-06-27 NOTE — Progress Notes (Signed)
Patient Care Team: Tawny Asal as PCP - General (Physician Assistant) Mauro Kaufmann, RN as Oncology Nurse Navigator Rockwell Germany, RN as Oncology Nurse Navigator Alphonsa Overall, MD as Consulting Physician (General Surgery) Nicholas Lose, MD as Consulting Physician (Hematology and Oncology) Gery Pray, MD as Consulting Physician (Radiation Oncology)  DIAGNOSIS:    ICD-10-CM   1. Malignant neoplasm of upper-inner quadrant of left breast in female, estrogen receptor positive ([place])  C50.212    Z17.0     SUMMARY OF ONCOLOGIC HISTORY: Oncology History  Malignant neoplasm of upper-inner quadrant of left breast in female, estrogen receptor positive ([place])  02/07/2019 Initial Diagnosis   Diagnostic mammogram following palpable lump in the left breast x6 months showed 3.0cm mass at the 9 oc'clock position 2cm from the nipple. Biopsy confirmed grade 3 IDC, HER2 negative by FISH, ER+ (100%), PR+ (80%), Ki67 20%.    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    03/29/2019 Cancer Staging   Staging form: Breast, AJCC 8th Edition - Pathologic stage from 03/29/2019: Stage IIA (pT2, pN1a, cM0, G3, ER+, PR+, HER2-) - Signed by Nicholas Lose, MD on 03/29/2019   04/26/2019 -  Chemotherapy   Adj chemo with AC-T     CHIEF COMPLIANT: Cycle2 Taxol  INTERVAL HISTORY: Linda Spears is a 58 y.o. with above-mentioned history of breast cancer who underwent a lumpectomy and is currently on adjuvant chemotherapy treatment. She completed 4 cycles of dose dense Adriamycin and Cytoxan and is currently receiving weekly Taxol treatments.She presents to the clinic  todayforcycle2 and a toxicity check.  She had mild nausea.  Appetite is returning back.  She is eating better.  Mild fatigue at the middle of the day.  REVIEW OF SYSTEMS:   Constitutional: Denies fevers, chills or abnormal weight loss Eyes: Denies blurriness of vision Ears, nose, mouth, throat, and face: Denies mucositis or sore throat Respiratory: Denies cough, dyspnea or wheezes Cardiovascular: Denies palpitation, chest discomfort Gastrointestinal: Denies nausea, heartburn or change in bowel habits Skin: Denies abnormal skin rashes Lymphatics: Denies new lymphadenopathy or easy bruising Neurological: Denies numbness, tingling or new weaknesses Behavioral/Psych: Mood is stable, no new changes  Extremities: No lower extremity edema Breast: denies any pain or lumps or nodules in either breasts All other systems were reviewed with the patient and are negative.  I have reviewed the past medical history, past surgical history, social history and family history with the patient and they are unchanged from previous note.  ALLERGIES:  has No Known Allergies.  MEDICATIONS:  Current Outpatient Medications  Medication Sig Dispense Refill   acetaminophen (TYLENOL) 500 MG tablet Take 1,000 mg by mouth every 6 (six) hours as needed for moderate pain.     lidocaine-prilocaine (EMLA) cream Apply to affected area once 30 g 3   LORazepam (ATIVAN) 0.5 MG tablet Take 1 tablet (0.5 mg total) by mouth at bedtime as needed for sleep. 30 tablet 0   ondansetron (ZOFRAN) 8 MG tablet Take 1 tablet (8 mg total) by mouth 2 (two) times daily as needed. Start on the third day after chemotherapy. 30 tablet 1   prochlorperazine (COMPAZINE) 10 MG tablet Take  1 tablet (10 mg total) by mouth every 6 (six) hours as needed (Nausea or vomiting). 30 tablet 1   No current facility-administered medications for this visit.    Facility-Administered Medications Ordered in Other Visits  Medication Dose Route Frequency  Provider Last Rate Last Dose   pegfilgrastim (NEULASTA) injection 6 mg  6 mg Subcutaneous Once Nicholas Lose, MD        PHYSICAL EXAMINATION: ECOG PERFORMANCE STATUS: 1 - Symptomatic but completely ambulatory  Vitals:   06/28/19 0830  BP: 113/69  Pulse: 74  Resp: 18  Temp: 98 F (36.7 C)  SpO2: 100%   Filed Weights   06/28/19 0830  Weight: 118 lb 9.6 oz (53.8 kg)    GENERAL: alert, no distress and comfortable SKIN: skin color, texture, turgor are normal, no rashes or significant lesions EYES: normal, Conjunctiva are pink and non-injected, sclera clear OROPHARYNX: no exudate, no erythema and lips, buccal mucosa, and tongue normal  NECK: supple, thyroid normal size, non-tender, without nodularity LYMPH: no palpable lymphadenopathy in the cervical, axillary or inguinal LUNGS: clear to auscultation and percussion with normal breathing effort HEART: regular rate & rhythm and no murmurs and no lower extremity edema ABDOMEN: abdomen soft, non-tender and normal bowel sounds MUSCULOSKELETAL: no cyanosis of digits and no clubbing  NEURO: alert & oriented x 3 with fluent speech, no focal motor/sensory deficits EXTREMITIES: No lower extremity edema  LABORATORY DATA:  I have reviewed the data as listed CMP Latest Ref Rng & Units 06/21/2019 06/07/2019 05/24/2019  Glucose 70 - 99 mg/dL 104(H) 117(H) 112(H)  BUN 6 - 20 mg/dL 4(L) 8 8  Creatinine 0.44 - 1.00 mg/dL 0.71 0.79 0.80  Sodium 135 - 145 mmol/L 140 141 140  Potassium 3.5 - 5.1 mmol/L 3.8 3.9 4.3  Chloride 98 - 111 mmol/L 105 108 106  CO2 22 - 32 mmol/L _0 Calcium 8.9 - 10.3 mg/dL 9.0 8.8(L) 9.2  Total Protein 6.5 - 8.1 g/dL 7.1 6.8 7.1  Total Bilirubin 0.3 - 1.2 mg/dL <0.2(L) <0.2(L) <0.2(L)  Alkaline Phos 38 - 126 U/L 119 124 120  AST 15 - 41 U/L 12(L) 13(L) 18  ALT 0 - 44 U/L _1 Lab Results  Component Value Date   WBC 6.8 06/28/2019   HGB 8.9 (L) 06/28/2019   HCT 27.8 (L) 06/28/2019   MCV 86.3  06/28/2019   PLT 519 (H) 06/28/2019   NEUTROABS 4.1 06/28/2019    ASSESSMENT & PLAN:  Malignant neoplasm of upper-inner quadrant of left breast in female, estrogen receptor positive ([place]) 03/14/2019:Left lumpectomy [name]): IDC, 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Adjuvant chemotherapy with dose dense Adriamycin and Cytoxan x4 followed by Taxol x12 2.Followed by adjuvant radiation 3.Followed by adjuvant antiestrogen therapy No adverse effects due to participation in the upbeat clinical trial ----------------------------------------------------------- Current treatment: Completed 4 cycles ofdose dense Adriamycin and Cytoxan, today is cycle 2 Taxol Echocardiogram 04/15/2019: EF 50 to 55%.  Chemo toxicities: Chemotherapy-induced anemia: Monitoring her hemoglobin.  Today's hemoglobin is 8.9. Monitoring closely for neuropathy.  Mild nausea  Enrolled in SWOG 1714 to prospectively evaluate for peripheral neuropathy. Return to clinic weekly for Taxol.    We will see her every other week    No orders of the defined types were placed  in this encounter.  The patient has a good understanding of the overall plan. she agrees with it. she will call with any problems that may develop before the next visit here.  Nicholas Lose, MD 06/28/2019  Julious Oka Dorshimer am acting as scribe for Dr. Nicholas Lose.  I have reviewed the above documentation for accuracy and completeness, and I agree with the above.

## 2019-06-28 ENCOUNTER — Inpatient Hospital Stay: Payer: Medicaid Other

## 2019-06-28 ENCOUNTER — Inpatient Hospital Stay (HOSPITAL_BASED_OUTPATIENT_CLINIC_OR_DEPARTMENT_OTHER): Payer: Medicaid Other | Admitting: Hematology and Oncology

## 2019-06-28 ENCOUNTER — Other Ambulatory Visit: Payer: Self-pay

## 2019-06-28 ENCOUNTER — Inpatient Hospital Stay: Payer: Medicaid Other | Attending: Hematology and Oncology

## 2019-06-28 DIAGNOSIS — Z5111 Encounter for antineoplastic chemotherapy: Secondary | ICD-10-CM | POA: Insufficient documentation

## 2019-06-28 DIAGNOSIS — Z170 Estrogen receptor positive status [ER+]: Secondary | ICD-10-CM

## 2019-06-28 DIAGNOSIS — C50212 Malignant neoplasm of upper-inner quadrant of left female breast: Secondary | ICD-10-CM | POA: Insufficient documentation

## 2019-06-28 LAB — CBC WITH DIFFERENTIAL (CANCER CENTER ONLY)
Abs Immature Granulocytes: 0.05 10*3/uL (ref 0.00–0.07)
Basophils Absolute: 0.1 10*3/uL (ref 0.0–0.1)
Basophils Relative: 1 %
Eosinophils Absolute: 0 10*3/uL (ref 0.0–0.5)
Eosinophils Relative: 1 %
HCT: 27.8 % — ABNORMAL LOW (ref 36.0–46.0)
Hemoglobin: 8.9 g/dL — ABNORMAL LOW (ref 12.0–15.0)
Immature Granulocytes: 1 %
Lymphocytes Relative: 21 %
Lymphs Abs: 1.5 10*3/uL (ref 0.7–4.0)
MCH: 27.6 pg (ref 26.0–34.0)
MCHC: 32 g/dL (ref 30.0–36.0)
MCV: 86.3 fL (ref 80.0–100.0)
Monocytes Absolute: 1 10*3/uL (ref 0.1–1.0)
Monocytes Relative: 15 %
Neutro Abs: 4.1 10*3/uL (ref 1.7–7.7)
Neutrophils Relative %: 61 %
Platelet Count: 519 10*3/uL — ABNORMAL HIGH (ref 150–400)
RBC: 3.22 MIL/uL — ABNORMAL LOW (ref 3.87–5.11)
RDW: 16.1 % — ABNORMAL HIGH (ref 11.5–15.5)
WBC Count: 6.8 10*3/uL (ref 4.0–10.5)
nRBC: 0 % (ref 0.0–0.2)

## 2019-06-28 LAB — CMP (CANCER CENTER ONLY)
ALT: 17 U/L (ref 0–44)
AST: 16 U/L (ref 15–41)
Albumin: 3.9 g/dL (ref 3.5–5.0)
Alkaline Phosphatase: 93 U/L (ref 38–126)
Anion gap: 8 (ref 5–15)
BUN: 7 mg/dL (ref 6–20)
CO2: 25 mmol/L (ref 22–32)
Calcium: 9.1 mg/dL (ref 8.9–10.3)
Chloride: 104 mmol/L (ref 98–111)
Creatinine: 0.74 mg/dL (ref 0.44–1.00)
GFR, Est AFR Am: >60 mL/min (ref >60)
GFR, Estimated: >60 mL/min (ref >60)
Glucose, Bld: 108 mg/dL — ABNORMAL HIGH (ref 70–99)
Potassium: 4 mmol/L (ref 3.5–5.1)
Sodium: 137 mmol/L (ref 135–145)
Total Bilirubin: <0.2 mg/dL — ABNORMAL LOW (ref 0.3–1.2)
Total Protein: 7.3 g/dL (ref 6.5–8.1)

## 2019-06-28 MED ORDER — FAMOTIDINE IN NACL 20-0.9 MG/50ML-% IV SOLN
20.0000 mg | Freq: Once | INTRAVENOUS | Status: AC
  Administered 2019-06-28: 10:00:00 20 mg via INTRAVENOUS

## 2019-06-28 MED ORDER — SODIUM CHLORIDE 0.9 % IV SOLN
Freq: Once | INTRAVENOUS | Status: AC
  Administered 2019-06-28: 09:00:00 via INTRAVENOUS
  Filled 2019-06-28: qty 250

## 2019-06-28 MED ORDER — DIPHENHYDRAMINE HCL 50 MG/ML IJ SOLN
50.0000 mg | Freq: Once | INTRAMUSCULAR | Status: AC
  Administered 2019-06-28: 50 mg via INTRAVENOUS

## 2019-06-28 MED ORDER — SODIUM CHLORIDE 0.9 % IV SOLN
20.0000 mg | Freq: Once | INTRAVENOUS | Status: AC
  Administered 2019-06-28: 10:00:00 20 mg via INTRAVENOUS
  Filled 2019-06-28: qty 20

## 2019-06-28 MED ORDER — SODIUM CHLORIDE 0.9% FLUSH
10.0000 mL | INTRAVENOUS | Status: DC | PRN
  Administered 2019-06-28: 12:00:00 10 mL
  Filled 2019-06-28: qty 10

## 2019-06-28 MED ORDER — LIDOCAINE-PRILOCAINE 2.5-2.5 % EX CREA: TOPICAL_CREAM | CUTANEOUS | 3 refills | Status: DC

## 2019-06-28 MED ORDER — FAMOTIDINE IN NACL 20-0.9 MG/50ML-% IV SOLN
INTRAVENOUS | Status: AC
  Filled 2019-06-28: qty 50

## 2019-06-28 MED ORDER — HEPARIN SOD (PORK) LOCK FLUSH 100 UNIT/ML IV SOLN
500.0000 [IU] | Freq: Once | INTRAVENOUS | Status: AC | PRN
  Administered 2019-06-28: 500 [IU]
  Filled 2019-06-28: qty 5

## 2019-06-28 MED ORDER — SODIUM CHLORIDE 0.9 % IV SOLN
80.0000 mg/m2 | Freq: Once | INTRAVENOUS | Status: AC
  Administered 2019-06-28: 11:00:00 126 mg via INTRAVENOUS
  Filled 2019-06-28: qty 21

## 2019-06-28 MED ORDER — DIPHENHYDRAMINE HCL 50 MG/ML IJ SOLN
INTRAMUSCULAR | Status: AC
  Filled 2019-06-28: qty 1

## 2019-06-28 NOTE — Assessment & Plan Note (Signed)
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Adjuvant chemotherapy with dose dense Adriamycin and Cytoxan x4 followed by Taxol x12 2.Followed by adjuvant radiation 3.Followed by adjuvant antiestrogen therapy No adverse effects due to participation in the upbeat clinical trial ----------------------------------------------------------- Current treatment: Completed 4 cycles ofdose dense Adriamycin and Cytoxan, today is cycle 2 Taxol Echocardiogram 04/15/2019: EF 50 to 55%.  Chemo toxicities: Chemotherapy-induced anemia: Monitoring her hemoglobin.  Today's hemoglobin is 8.9. Monitoring closely for neuropathy.   Enrolled in SWOG 1714 to prospectively evaluate for peripheral neuropathy. Return to clinic weekly for Taxol.    We will see her every other week

## 2019-06-28 NOTE — Patient Instructions (Signed)
[facility] Discharge Instructions for Patients Receiving Chemotherapy  Today you received the following chemotherapy agent: Paclitaxel  To help prevent nausea and vomiting after your treatment, we encourage you to take your nausea medication as directed by your MD.   If you develop nausea and vomiting that is not controlled by your nausea medication, call the clinic.   BELOW ARE SYMPTOMS THAT SHOULD BE REPORTED IMMEDIATELY:  *FEVER GREATER THAN 100.5 F  *CHILLS WITH OR WITHOUT FEVER  NAUSEA AND VOMITING THAT IS NOT CONTROLLED WITH YOUR NAUSEA MEDICATION  *UNUSUAL SHORTNESS OF BREATH  *UNUSUAL BRUISING OR BLEEDING  TENDERNESS IN MOUTH AND THROAT WITH OR WITHOUT PRESENCE OF ULCERS  *URINARY PROBLEMS  *BOWEL PROBLEMS  UNUSUAL RASH Items with * indicate a potential emergency and should be followed up as soon as possible.  Feel free to call the clinic should you have any questions or concerns. The clinic phone number is [phone].  Please show the [facility] at check-in to the Emergency Department and triage nurse. Coronavirus (COVID-19) Are you at risk?  Are you at risk for the Coronavirus (COVID-19)?  To be considered HIGH RISK for Coronavirus (COVID-19), you have to meet the following criteria:  . Traveled to [place], [place], [place], [place] or [place]; or in the [place] to [place], [place], [place], or [place]; and have fever, cough, and shortness of breath within the last 2 weeks of travel OR . Been in close contact with a person diagnosed with COVID-19 within the last 2 weeks and have fever, cough, and shortness of breath . IF YOU DO NOT MEET THESE CRITERIA, YOU ARE CONSIDERED LOW RISK FOR COVID-19.  What to do if you are HIGH RISK for COVID-19?  Marland Kitchen If you are having a medical emergency, call 911. . Seek medical care right away. Before you go to a doctor's office, urgent care or emergency department, call ahead and tell them about  your recent travel, contact with someone diagnosed with COVID-19, and your symptoms. You should receive instructions from your physician's office regarding next steps of care.  . When you arrive at healthcare provider, tell the healthcare staff immediately you have returned from visiting [place], [place], [place], [place] or [place]; or traveled in the [place] to [place], [place], [place], or [place]; in the last two weeks or you have been in close contact with a person diagnosed with COVID-19 in the last 2 weeks.   . Tell the health care staff about your symptoms: fever, cough and shortness of breath. . After you have been seen by a medical provider, you will be either: o Tested for (COVID-19) and discharged home on quarantine except to seek medical care if symptoms worsen, and asked to  - Stay home and avoid contact with others until you get your results (4-5 days)  - Avoid travel on public transportation if possible (such as bus, train, or airplane) or o Sent to the Emergency Department by EMS for evaluation, COVID-19 testing, and possible admission depending on your condition and test results.  What to do if you are LOW RISK for COVID-19?  Reduce your risk of any infection by using the same precautions used for avoiding the common cold or flu:  Marland Kitchen Wash your hands often with soap and warm water for at least 20 seconds.  If soap and water are not readily available, use an alcohol-based hand sanitizer with at least 60% alcohol.  . If coughing  or sneezing, cover your mouth and nose by coughing or sneezing into the elbow areas of your shirt or coat, into a tissue or into your sleeve (not your hands). . Avoid shaking hands with others and consider head nods or verbal greetings only. . Avoid touching your eyes, nose, or mouth with unwashed hands.  . Avoid close contact with people who are sick. . Avoid places or events with large numbers of people in one location, like concerts or sporting  events. . Carefully consider travel plans you have or are making. . If you are planning any travel outside or inside the [place], visit the CDC's Travelers' Health webpage for the latest health notices. . If you have some symptoms but not all symptoms, continue to monitor at home and seek medical attention if your symptoms worsen. . If you are having a medical emergency, call 911.   ADDITIONAL HEALTHCARE OPTIONS FOR PATIENTS  [facility] / e-Visit: [email or web address]         [facility]: [phone]  [facility]: [phone]                   [facility]: [phone]    

## 2019-07-05 ENCOUNTER — Inpatient Hospital Stay: Payer: Medicaid Other

## 2019-07-05 ENCOUNTER — Encounter: Payer: Medicaid Other | Admitting: *Deleted

## 2019-07-05 ENCOUNTER — Other Ambulatory Visit: Payer: Self-pay

## 2019-07-05 ENCOUNTER — Inpatient Hospital Stay (HOSPITAL_COMMUNITY): Admission: RE | Admit: 2019-07-05 | Payer: Medicaid Other | Source: Ambulatory Visit

## 2019-07-05 VITALS — BP 114/76 | HR 58 | Temp 98.5°F | Resp 18

## 2019-07-05 DIAGNOSIS — C50212 Malignant neoplasm of upper-inner quadrant of left female breast: Secondary | ICD-10-CM

## 2019-07-05 DIAGNOSIS — Z170 Estrogen receptor positive status [ER+]: Secondary | ICD-10-CM

## 2019-07-05 DIAGNOSIS — Z5111 Encounter for antineoplastic chemotherapy: Secondary | ICD-10-CM | POA: Diagnosis not present

## 2019-07-05 DIAGNOSIS — Z95828 Presence of other vascular implants and grafts: Secondary | ICD-10-CM

## 2019-07-05 LAB — CBC WITH DIFFERENTIAL (CANCER CENTER ONLY)
Abs Immature Granulocytes: 0.03 10*3/uL (ref 0.00–0.07)
Basophils Absolute: 0.1 10*3/uL (ref 0.0–0.1)
Basophils Relative: 2 %
Eosinophils Absolute: 0.1 10*3/uL (ref 0.0–0.5)
Eosinophils Relative: 2 %
HCT: 29.3 % — ABNORMAL LOW (ref 36.0–46.0)
Hemoglobin: 9.2 g/dL — ABNORMAL LOW (ref 12.0–15.0)
Immature Granulocytes: 1 %
Lymphocytes Relative: 29 %
Lymphs Abs: 1.4 10*3/uL (ref 0.7–4.0)
MCH: 27.9 pg (ref 26.0–34.0)
MCHC: 31.4 g/dL (ref 30.0–36.0)
MCV: 88.8 fL (ref 80.0–100.0)
Monocytes Absolute: 0.8 10*3/uL (ref 0.1–1.0)
Monocytes Relative: 15 %
Neutro Abs: 2.6 10*3/uL (ref 1.7–7.7)
Neutrophils Relative %: 51 %
Platelet Count: 410 10*3/uL — ABNORMAL HIGH (ref 150–400)
RBC: 3.3 MIL/uL — ABNORMAL LOW (ref 3.87–5.11)
RDW: 18 % — ABNORMAL HIGH (ref 11.5–15.5)
WBC Count: 4.9 10*3/uL (ref 4.0–10.5)
nRBC: 0 % (ref 0.0–0.2)

## 2019-07-05 LAB — CMP (CANCER CENTER ONLY)
ALT: 14 U/L (ref 0–44)
AST: 16 U/L (ref 15–41)
Albumin: 3.8 g/dL (ref 3.5–5.0)
Alkaline Phosphatase: 79 U/L (ref 38–126)
Anion gap: 9 (ref 5–15)
BUN: 7 mg/dL (ref 6–20)
CO2: 23 mmol/L (ref 22–32)
Calcium: 8.8 mg/dL — ABNORMAL LOW (ref 8.9–10.3)
Chloride: 106 mmol/L (ref 98–111)
Creatinine: 0.74 mg/dL (ref 0.44–1.00)
GFR, Est AFR Am: >60 mL/min (ref >60)
GFR, Estimated: >60 mL/min (ref >60)
Glucose, Bld: 123 mg/dL — ABNORMAL HIGH (ref 70–99)
Potassium: 3.6 mmol/L (ref 3.5–5.1)
Sodium: 138 mmol/L (ref 135–145)
Total Bilirubin: 0.2 mg/dL — ABNORMAL LOW (ref 0.3–1.2)
Total Protein: 6.8 g/dL (ref 6.5–8.1)

## 2019-07-05 MED ORDER — SODIUM CHLORIDE 0.9 % IV SOLN
20.0000 mg | Freq: Once | INTRAVENOUS | Status: AC
  Administered 2019-07-05: 20 mg via INTRAVENOUS
  Filled 2019-07-05: qty 20

## 2019-07-05 MED ORDER — SODIUM CHLORIDE 0.9 % IV SOLN
Freq: Once | INTRAVENOUS | Status: AC
  Administered 2019-07-05: 10:00:00 via INTRAVENOUS
  Filled 2019-07-05: qty 250

## 2019-07-05 MED ORDER — FAMOTIDINE IN NACL 20-0.9 MG/50ML-% IV SOLN
INTRAVENOUS | Status: AC
  Filled 2019-07-05: qty 50

## 2019-07-05 MED ORDER — HEPARIN SOD (PORK) LOCK FLUSH 100 UNIT/ML IV SOLN
500.0000 [IU] | Freq: Once | INTRAVENOUS | Status: AC | PRN
  Administered 2019-07-05: 500 [IU]
  Filled 2019-07-05: qty 5

## 2019-07-05 MED ORDER — FAMOTIDINE IN NACL 20-0.9 MG/50ML-% IV SOLN
20.0000 mg | Freq: Once | INTRAVENOUS | Status: AC
  Administered 2019-07-05: 20 mg via INTRAVENOUS

## 2019-07-05 MED ORDER — SODIUM CHLORIDE 0.9% FLUSH
10.0000 mL | INTRAVENOUS | Status: DC | PRN
  Administered 2019-07-05: 10 mL
  Filled 2019-07-05: qty 10

## 2019-07-05 MED ORDER — DIPHENHYDRAMINE HCL 50 MG/ML IJ SOLN
50.0000 mg | Freq: Once | INTRAMUSCULAR | Status: AC
  Administered 2019-07-05: 50 mg via INTRAVENOUS

## 2019-07-05 MED ORDER — DIPHENHYDRAMINE HCL 50 MG/ML IJ SOLN
INTRAMUSCULAR | Status: AC
  Filled 2019-07-05: qty 1

## 2019-07-05 MED ORDER — SODIUM CHLORIDE 0.9 % IV SOLN
80.0000 mg/m2 | Freq: Once | INTRAVENOUS | Status: AC
  Administered 2019-07-05: 126 mg via INTRAVENOUS
  Filled 2019-07-05: qty 21

## 2019-07-05 MED ORDER — SODIUM CHLORIDE 0.9% FLUSH
10.0000 mL | Freq: Once | INTRAVENOUS | Status: AC
  Administered 2019-07-05: 10 mL
  Filled 2019-07-05: qty 10

## 2019-07-05 NOTE — Patient Instructions (Signed)

## 2019-07-05 NOTE — Patient Instructions (Signed)
[facility] Discharge Instructions for Patients Receiving Chemotherapy  Today you received the following chemotherapy agents :  Taxol.  To help prevent nausea and vomiting after your treatment, we encourage you to take your nausea medication as prescribed.   If you develop nausea and vomiting that is not controlled by your nausea medication, call the clinic.   BELOW ARE SYMPTOMS THAT SHOULD BE REPORTED IMMEDIATELY:  *FEVER GREATER THAN 100.5 F  *CHILLS WITH OR WITHOUT FEVER  NAUSEA AND VOMITING THAT IS NOT CONTROLLED WITH YOUR NAUSEA MEDICATION  *UNUSUAL SHORTNESS OF BREATH  *UNUSUAL BRUISING OR BLEEDING  TENDERNESS IN MOUTH AND THROAT WITH OR WITHOUT PRESENCE OF ULCERS  *URINARY PROBLEMS  *BOWEL PROBLEMS  UNUSUAL RASH Items with * indicate a potential emergency and should be followed up as soon as possible.  Feel free to call the clinic should you have any questions or concerns. The clinic phone number is [phone].  Please show the CHEMO ALERT CARD at check-in to the Emergency Department and triage nurse.   

## 2019-07-11 ENCOUNTER — Telehealth: Payer: Self-pay | Admitting: *Deleted

## 2019-07-11 NOTE — Telephone Encounter (Signed)
Upbeat Study; LVM for patient reminding her to fast for at least 3 hours tomorrow morning before her lab appointment. Reminded patient we will be collecting research labs along with her regular lab work tomororow. Asked her to call research nurse back if any questions.  Foye Spurling, BSN, RN Clinical Research Nurse 07/11/2019 2:09 PM

## 2019-07-11 NOTE — Progress Notes (Signed)
Patient Care Team: Tawny Asal as PCP - General (Physician Assistant) Mauro Kaufmann, RN as Oncology Nurse Navigator Rockwell Germany, RN as Oncology Nurse Navigator Alphonsa Overall, MD as Consulting Physician (General Surgery) Nicholas Lose, MD as Consulting Physician (Hematology and Oncology) Gery Pray, MD as Consulting Physician (Radiation Oncology)  DIAGNOSIS:    ICD-10-CM   1. Malignant neoplasm of upper-inner quadrant of left breast in female, estrogen receptor positive ([place])  C50.212    Z17.0     SUMMARY OF ONCOLOGIC HISTORY: Oncology History  Malignant neoplasm of upper-inner quadrant of left breast in female, estrogen receptor positive ([place])  02/07/2019 Initial Diagnosis   Diagnostic mammogram following palpable lump in the left breast x6 months showed 3.0cm mass at the 9 oc'clock position 2cm from the nipple. Biopsy confirmed grade 3 IDC, HER2 negative by FISH, ER+ (100%), PR+ (80%), Ki67 20%.    02/09/2019 Cancer Staging   Staging form: Breast, AJCC 8th Edition - Clinical stage from 02/09/2019: Stage IIA (cT2, cN0, cM0, G3, ER+, PR+, HER2-) - Signed by Nicholas Lose, MD on 02/09/2019   03/22/2019 Surgery   Left lumpectomy Linda Spears): IDC, 2.9cm, grade 3, high grade DCIS, lymphovascular invasion present, clear margins and 1/3 lymph nodes positive for carcinoma measuring 0.7cm.    03/29/2019 Cancer Staging   Staging form: Breast, AJCC 8th Edition - Pathologic stage from 03/29/2019: Stage IIA (pT2, pN1a, cM0, G3, ER+, PR+, HER2-) - Signed by Nicholas Lose, MD on 03/29/2019   04/26/2019 -  Chemotherapy   Adj chemo with AC-T     CHIEF COMPLIANT: Cycle4 Taxol  INTERVAL HISTORY: Linda Spears is a 58 y.o. with above-mentioned history of breast cancer who underwent a lumpectomy and is currently on adjuvant chemotherapy treatment. She completed 4 cycles of dosedense Adriamycin and Cytoxanand is currently receiving weekly Taxol treatments.She is a participant in the  neuropathy clinical trial. She presents to the clinic todayforcycle4 and a toxicity check.  REVIEW OF SYSTEMS:   Constitutional: Denies fevers, chills or abnormal weight loss Eyes: Denies blurriness of vision Ears, nose, mouth, throat, and face: Denies mucositis or sore throat Respiratory: Denies cough, dyspnea or wheezes Cardiovascular: Denies palpitation, chest discomfort Gastrointestinal: Denies nausea, heartburn or change in bowel habits Skin: Denies abnormal skin rashes Lymphatics: Denies new lymphadenopathy or easy bruising Neurological: Denies numbness, tingling or new weaknesses Behavioral/Psych: Mood is stable, no new changes  Extremities: No lower extremity edema Breast: denies any pain or lumps or nodules in either breasts All other systems were reviewed with the patient and are negative.  I have reviewed the past medical history, past surgical history, social history and family history with the patient and they are unchanged from previous note.  ALLERGIES:  has No Known Allergies.  MEDICATIONS:  Current Outpatient Medications  Medication Sig Dispense Refill  . acetaminophen (TYLENOL) 500 MG tablet Take 1,000 mg by mouth every 6 (six) hours as needed for moderate pain.    Marland Kitchen lidocaine-prilocaine (EMLA) cream Apply to affected area once 30 g 3  . LORazepam (ATIVAN) 0.5 MG tablet Take 1 tablet (0.5 mg total) by mouth at bedtime as needed for sleep. 30 tablet 0  . ondansetron (ZOFRAN) 8 MG tablet Take 1 tablet (8 mg total) by mouth 2 (two) times daily as needed. Start on the third day after chemotherapy. 30 tablet 1  . prochlorperazine (COMPAZINE) 10 MG tablet Take 1 tablet (10 mg total) by mouth every 6 (six) hours as needed (Nausea or vomiting). [place]  tablet 1   No current facility-administered medications for this visit.    Facility-Administered Medications Ordered in Other Visits  Medication Dose Route Frequency Provider Last Rate Last Dose  . pegfilgrastim (NEULASTA)  injection 6 mg  6 mg Subcutaneous Once Nicholas Lose, MD        PHYSICAL EXAMINATION: ECOG PERFORMANCE STATUS: 1 - Symptomatic but completely ambulatory  There were no vitals filed for this visit. There were no vitals filed for this visit.  GENERAL: alert, no distress and comfortable SKIN: skin color, texture, turgor are normal, no rashes or significant lesions EYES: normal, Conjunctiva are pink and non-injected, sclera clear OROPHARYNX: no exudate, no erythema and lips, buccal mucosa, and tongue normal  NECK: supple, thyroid normal size, non-tender, without nodularity LYMPH: no palpable lymphadenopathy in the cervical, axillary or inguinal LUNGS: clear to auscultation and percussion with normal breathing effort HEART: regular rate & rhythm and no murmurs and no lower extremity edema ABDOMEN: abdomen soft, non-tender and normal bowel sounds MUSCULOSKELETAL: no cyanosis of digits and no clubbing  NEURO: alert & oriented x 3 with fluent speech, no focal motor/sensory deficits EXTREMITIES: No lower extremity edema  LABORATORY DATA:  I have reviewed the data as listed CMP Latest Ref Rng & Units 07/05/2019 06/28/2019 06/21/2019  Glucose 70 - 99 mg/dL 123(H) 108(H) 104(H)  BUN 6 - 20 mg/dL 7 7 4(L)  Creatinine 0.44 - 1.00 mg/dL 0.74 0.74 0.71  Sodium 135 - 145 mmol/L 138 137 140  Potassium 3.5 - 5.1 mmol/L 3.6 4.0 3.8  Chloride 98 - 111 mmol/L 106 104 105  CO2 22 - 32 mmol/L '23 25 25  '$ Calcium 8.9 - 10.3 mg/dL 8.8(L) 9.1 9.0  Total Protein 6.5 - 8.1 g/dL 6.8 7.3 7.1  Total Bilirubin 0.3 - 1.2 mg/dL 0.2(L) <0.2(L) <0.2(L)  Alkaline Phos 38 - 126 U/L 79 93 119  AST 15 - 41 U/L 16 16 12(L)  ALT 0 - 44 U/L '14 17 9    '$ Lab Results  Component Value Date   WBC 4.9 07/05/2019   HGB 9.2 (L) 07/05/2019   HCT 29.3 (L) 07/05/2019   MCV 88.8 07/05/2019   PLT 410 (H) 07/05/2019   NEUTROABS 2.6 07/05/2019    ASSESSMENT & PLAN:  Malignant neoplasm of upper-inner quadrant of left breast in  female, estrogen receptor positive ([place]) 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Adjuvant chemotherapy with dose dense Adriamycin and Cytoxan x4 followed by Taxol x12 2.Followed by adjuvant radiation 3.Followed by adjuvant antiestrogen therapy No adverse effects due to participation in the upbeat clinical trial ----------------------------------------------------------- Current treatment:Completed 4 cycles ofdose dense Adriamycin and Cytoxan, today is cycle 4 Taxol Echocardiogram 04/15/2019: EF 50 to 55%.  Chemo toxicities: Chemotherapy-induced anemia: Monitoring her hemoglobin. Today's hemoglobin is 8.9. Monitoring closely for neuropathy. Mild nausea  Enrolled in SWOG 1714to prospectively evaluate for peripheral neuropathy. Return to clinicweekly for Taxol.   We will see her every other week   No orders of the defined types were placed in this encounter.  The patient has a good understanding of the overall plan. she agrees with it. she will call with any problems that may develop before the next visit here.  Nicholas Lose, MD 07/12/2019  Julious Oka Dorshimer, am acting as scribe for Dr. Nicholas Lose.  I have reviewed the above documentation for accuracy and completeness, and I agree with the above.

## 2019-07-12 ENCOUNTER — Encounter: Payer: Self-pay | Admitting: *Deleted

## 2019-07-12 ENCOUNTER — Inpatient Hospital Stay (HOSPITAL_BASED_OUTPATIENT_CLINIC_OR_DEPARTMENT_OTHER): Payer: Medicaid Other | Admitting: Hematology and Oncology

## 2019-07-12 ENCOUNTER — Inpatient Hospital Stay: Payer: Medicaid Other

## 2019-07-12 ENCOUNTER — Other Ambulatory Visit: Payer: Self-pay

## 2019-07-12 DIAGNOSIS — Z5111 Encounter for antineoplastic chemotherapy: Secondary | ICD-10-CM | POA: Diagnosis not present

## 2019-07-12 DIAGNOSIS — C50212 Malignant neoplasm of upper-inner quadrant of left female breast: Secondary | ICD-10-CM

## 2019-07-12 DIAGNOSIS — Z170 Estrogen receptor positive status [ER+]: Secondary | ICD-10-CM | POA: Diagnosis not present

## 2019-07-12 DIAGNOSIS — Z95828 Presence of other vascular implants and grafts: Secondary | ICD-10-CM

## 2019-07-12 LAB — CBC WITH DIFFERENTIAL (CANCER CENTER ONLY)
Abs Immature Granulocytes: 0.11 10*3/uL — ABNORMAL HIGH (ref 0.00–0.07)
Basophils Absolute: 0.1 10*3/uL (ref 0.0–0.1)
Basophils Relative: 1 %
Eosinophils Absolute: 0.2 10*3/uL (ref 0.0–0.5)
Eosinophils Relative: 3 %
HCT: 31.1 % — ABNORMAL LOW (ref 36.0–46.0)
Hemoglobin: 9.8 g/dL — ABNORMAL LOW (ref 12.0–15.0)
Immature Granulocytes: 2 %
Lymphocytes Relative: 25 %
Lymphs Abs: 1.7 10*3/uL (ref 0.7–4.0)
MCH: 28.4 pg (ref 26.0–34.0)
MCHC: 31.5 g/dL (ref 30.0–36.0)
MCV: 90.1 fL (ref 80.0–100.0)
Monocytes Absolute: 0.8 10*3/uL (ref 0.1–1.0)
Monocytes Relative: 11 %
Neutro Abs: 3.9 10*3/uL (ref 1.7–7.7)
Neutrophils Relative %: 58 %
Platelet Count: 358 10*3/uL (ref 150–400)
RBC: 3.45 MIL/uL — ABNORMAL LOW (ref 3.87–5.11)
RDW: 18.8 % — ABNORMAL HIGH (ref 11.5–15.5)
WBC Count: 6.7 10*3/uL (ref 4.0–10.5)
nRBC: 0 % (ref 0.0–0.2)

## 2019-07-12 LAB — CMP (CANCER CENTER ONLY)
ALT: 22 U/L (ref 0–44)
AST: 18 U/L (ref 15–41)
Albumin: 4 g/dL (ref 3.5–5.0)
Alkaline Phosphatase: 78 U/L (ref 38–126)
Anion gap: 13 (ref 5–15)
BUN: 8 mg/dL (ref 6–20)
CO2: 20 mmol/L — ABNORMAL LOW (ref 22–32)
Calcium: 8.9 mg/dL (ref 8.9–10.3)
Chloride: 107 mmol/L (ref 98–111)
Creatinine: 0.81 mg/dL (ref 0.44–1.00)
GFR, Est AFR Am: >60 mL/min (ref >60)
GFR, Estimated: >60 mL/min (ref >60)
Glucose, Bld: 129 mg/dL — ABNORMAL HIGH (ref 70–99)
Potassium: 3.7 mmol/L (ref 3.5–5.1)
Sodium: 140 mmol/L (ref 135–145)
Total Bilirubin: <0.2 mg/dL — ABNORMAL LOW (ref 0.3–1.2)
Total Protein: 7 g/dL (ref 6.5–8.1)

## 2019-07-12 MED ORDER — FAMOTIDINE IN NACL 20-0.9 MG/50ML-% IV SOLN
INTRAVENOUS | Status: AC
  Filled 2019-07-12: qty 50

## 2019-07-12 MED ORDER — DIPHENHYDRAMINE HCL 50 MG/ML IJ SOLN
INTRAMUSCULAR | Status: AC
  Filled 2019-07-12: qty 1

## 2019-07-12 MED ORDER — DIPHENHYDRAMINE HCL 50 MG/ML IJ SOLN
50.0000 mg | Freq: Once | INTRAMUSCULAR | Status: AC
  Administered 2019-07-12: 11:00:00 50 mg via INTRAVENOUS

## 2019-07-12 MED ORDER — SODIUM CHLORIDE 0.9% FLUSH
10.0000 mL | INTRAVENOUS | Status: DC | PRN
  Administered 2019-07-12: 10 mL
  Filled 2019-07-12: qty 10

## 2019-07-12 MED ORDER — SODIUM CHLORIDE 0.9% FLUSH
10.0000 mL | Freq: Once | INTRAVENOUS | Status: AC
  Administered 2019-07-12: 09:00:00 10 mL
  Filled 2019-07-12: qty 10

## 2019-07-12 MED ORDER — HEPARIN SOD (PORK) LOCK FLUSH 100 UNIT/ML IV SOLN
500.0000 [IU] | Freq: Once | INTRAVENOUS | Status: AC | PRN
  Administered 2019-07-12: 14:00:00 500 [IU]
  Filled 2019-07-12: qty 5

## 2019-07-12 MED ORDER — SODIUM CHLORIDE 0.9 % IV SOLN
Freq: Once | INTRAVENOUS | Status: AC
  Administered 2019-07-12: 11:00:00 via INTRAVENOUS
  Filled 2019-07-12: qty 250

## 2019-07-12 MED ORDER — FAMOTIDINE IN NACL 20-0.9 MG/50ML-% IV SOLN
20.0000 mg | Freq: Once | INTRAVENOUS | Status: AC
  Administered 2019-07-12: 20 mg via INTRAVENOUS

## 2019-07-12 MED ORDER — SODIUM CHLORIDE 0.9 % IV SOLN
20.0000 mg | Freq: Once | INTRAVENOUS | Status: AC
  Administered 2019-07-12: 11:00:00 20 mg via INTRAVENOUS
  Filled 2019-07-12: qty 2

## 2019-07-12 MED ORDER — SODIUM CHLORIDE 0.9 % IV SOLN
80.0000 mg/m2 | Freq: Once | INTRAVENOUS | Status: AC
  Administered 2019-07-12: 126 mg via INTRAVENOUS
  Filled 2019-07-12: qty 21

## 2019-07-12 NOTE — Progress Notes (Signed)
error 

## 2019-07-12 NOTE — Assessment & Plan Note (Signed)
03/14/2019:Left lumpectomy ([place]): IDC, 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Adjuvant chemotherapy with dose dense Adriamycin and Cytoxan x4 followed by Taxol x12 2.Followed by adjuvant radiation 3.Followed by adjuvant antiestrogen therapy No adverse effects due to participation in the upbeat clinical trial ----------------------------------------------------------- Current treatment:Completed 4 cycles ofdose dense Adriamycin and Cytoxan, today is cycle 4 Taxol Echocardiogram 04/15/2019: EF 50 to 55%.  Chemo toxicities: Chemotherapy-induced anemia: Monitoring her hemoglobin. Today's hemoglobin is 8.9. Monitoring closely for neuropathy. Mild nausea  Enrolled in SWOG 1714to prospectively evaluate for peripheral neuropathy. Return to clinicweekly for Taxol.   We will see her every other week 

## 2019-07-12 NOTE — Research (Signed)
3 MONTHS ACTIVITIES FOR UPBEAT PY:[id number]) STUDY; Patient in clinic today for treatment and completed some of the 3 months study activities today.  Patient is scheduled to complete the remainder of study activities Friday 07/15/19. We completed the ones that could be done easily in infusion room today to save patient some time on her visit Friday, which patient appreciated.  PROs: Completed by patient in clinic and returned to Research nurse.  Reviewed for completeness and accuracy. Research nurse reviewed section E of the questionnaire booklet,Depressive Symptom Screen, and patient scored a "0", which does not requireany reporting orfollow up per protocol. Research Labs: Collected mandatory and optional specimens per protocol. Patient confirmed she had been fasting for at least 3 hours prior to this appointment.  VS: Blood pressures and heart rates obtained per protocol. See VS flow sheet. Ht & Wt: Will obtain on Friday's visit.  Concomitant Medication Review: Reviewed current medication list with patient.Updated CRFs accordingly.  Neurocognitive Testing: Scheduled for 07/15/19. Physical Functions Testing:Scheduled for 07/15/19. CardiacMRI:Scheduled for 07/15/19. Plan: Patient scheduled 07/15/19 at 8 am with research RN and MRI to complete this visit. Thanked patient again for her participation and encouraged her to call research nurse if any questions prior to Friday's visit.She verbalized understanding. Foye Spurling, BSN, RN Clinical Research Nurse 07/12/2019 12:51 PM

## 2019-07-12 NOTE — Patient Instructions (Signed)
[facility] Discharge Instructions for Patients Receiving Chemotherapy  Today you received the following chemotherapy agents :  Taxol.  To help prevent nausea and vomiting after your treatment, we encourage you to take your nausea medication as prescribed.   If you develop nausea and vomiting that is not controlled by your nausea medication, call the clinic.   BELOW ARE SYMPTOMS THAT SHOULD BE REPORTED IMMEDIATELY:  *FEVER GREATER THAN 100.5 F  *CHILLS WITH OR WITHOUT FEVER  NAUSEA AND VOMITING THAT IS NOT CONTROLLED WITH YOUR NAUSEA MEDICATION  *UNUSUAL SHORTNESS OF BREATH  *UNUSUAL BRUISING OR BLEEDING  TENDERNESS IN MOUTH AND THROAT WITH OR WITHOUT PRESENCE OF ULCERS  *URINARY PROBLEMS  *BOWEL PROBLEMS  UNUSUAL RASH Items with * indicate a potential emergency and should be followed up as soon as possible.  Feel free to call the clinic should you have any questions or concerns. The clinic phone number is [phone].  Please show the CHEMO ALERT CARD at check-in to the Emergency Department and triage nurse.   

## 2019-07-15 ENCOUNTER — Inpatient Hospital Stay: Payer: Medicaid Other | Admitting: *Deleted

## 2019-07-15 ENCOUNTER — Ambulatory Visit (HOSPITAL_COMMUNITY)
Admission: RE | Admit: 2019-07-15 | Discharge: 2019-07-15 | Disposition: A | Discharge status: Home / Self Care | Payer: Medicaid Other | Source: Ambulatory Visit | Attending: Hematology and Oncology | Admitting: Hematology and Oncology

## 2019-07-15 ENCOUNTER — Other Ambulatory Visit: Payer: Self-pay

## 2019-07-15 VITALS — Ht 62.0 in | Wt 120.5 lb

## 2019-07-15 DIAGNOSIS — Z006 Encounter for examination for normal comparison and control in clinical research program: Secondary | ICD-10-CM | POA: Insufficient documentation

## 2019-07-15 DIAGNOSIS — C50212 Malignant neoplasm of upper-inner quadrant of left female breast: Secondary | ICD-10-CM

## 2019-07-15 DIAGNOSIS — Z170 Estrogen receptor positive status [ER+]: Secondary | ICD-10-CM

## 2019-07-15 NOTE — Research (Signed)
3 MONTHS ACTIVITIES FOR UPBEAT PY:[id number]) STUDY; Patient in clinic today to complete the remainder of the 3 months study activities today.   PROs: See research encounter for 07/12/19.  Research Labs:  See research encounter for 07/12/19.   VS: See research encounter for 07/12/19.  Ht & Wt: Obtained today per CRF13 instructions.  See VS flowsheet Waist Measurement: 33 inches, obtained today per CRF 13 instructions.  Concomitant Medication Review:  See research encounter for 07/12/19.   Neurocognitive Testing: Completed today by Remer Macho, research specialist.  Physical Functions Testing: Patient completed today without difficulty.  CardiacMRI:Patient completed today.  Gift Card and Tote Bag: Given to patient today after she completed all the study activities for this time point.  Plan: Next study visit due for 12 months assessment on 04/20/20 (+/- 60 days). Informed patient that research nurse will be in touch to schedule this visit a few months in advance.  Thanked patient again for her participation and encouraged her to call research nurse if any questions prior to next study visit.She verbalized understanding. Foye Spurling, BSN, RN Clinical Research Nurse 07/15/2019 10:44 AM

## 2019-07-18 ENCOUNTER — Telehealth: Payer: Self-pay | Admitting: *Deleted

## 2019-07-18 NOTE — Telephone Encounter (Signed)
[id number]- Informed patient of 4 week research assessments for [phone] will be moved from tomorrow morning to next Tuesday 12/1. This is due to a scheduling conflict with research nurse having another research patient at the same time. The protocol allows a +/- 14 day window for this visit so we will still be within the study window for this visit next week.  Patient verbalized understanding. Thanked patient for her time and understanding with schedule change. Look forward to seeing her next week.  Foye Spurling, BSN, RN Clinical Research Nurse 07/18/2019 11:55 AM

## 2019-07-19 ENCOUNTER — Inpatient Hospital Stay: Payer: Medicaid Other

## 2019-07-19 ENCOUNTER — Other Ambulatory Visit: Payer: Self-pay

## 2019-07-19 ENCOUNTER — Inpatient Hospital Stay: Payer: Medicaid Other | Admitting: *Deleted

## 2019-07-19 ENCOUNTER — Other Ambulatory Visit: Payer: Self-pay | Admitting: Hematology and Oncology

## 2019-07-19 VITALS — BP 126/82 | HR 73 | Temp 98.2°F | Resp 18

## 2019-07-19 DIAGNOSIS — C50212 Malignant neoplasm of upper-inner quadrant of left female breast: Secondary | ICD-10-CM

## 2019-07-19 DIAGNOSIS — Z5111 Encounter for antineoplastic chemotherapy: Secondary | ICD-10-CM | POA: Diagnosis not present

## 2019-07-19 DIAGNOSIS — Z170 Estrogen receptor positive status [ER+]: Secondary | ICD-10-CM

## 2019-07-19 DIAGNOSIS — Z95828 Presence of other vascular implants and grafts: Secondary | ICD-10-CM

## 2019-07-19 LAB — CBC WITH DIFFERENTIAL (CANCER CENTER ONLY)
Abs Immature Granulocytes: 0.03 10*3/uL (ref 0.00–0.07)
Basophils Absolute: 0 10*3/uL (ref 0.0–0.1)
Basophils Relative: 1 %
Eosinophils Absolute: 0.1 10*3/uL (ref 0.0–0.5)
Eosinophils Relative: 2 %
HCT: 30.4 % — ABNORMAL LOW (ref 36.0–46.0)
Hemoglobin: 9.6 g/dL — ABNORMAL LOW (ref 12.0–15.0)
Immature Granulocytes: 1 %
Lymphocytes Relative: 26 %
Lymphs Abs: 1.4 10*3/uL (ref 0.7–4.0)
MCH: 28.1 pg (ref 26.0–34.0)
MCHC: 31.6 g/dL (ref 30.0–36.0)
MCV: 88.9 fL (ref 80.0–100.0)
Monocytes Absolute: 0.5 10*3/uL (ref 0.1–1.0)
Monocytes Relative: 9 %
Neutro Abs: 3.3 10*3/uL (ref 1.7–7.7)
Neutrophils Relative %: 61 %
Platelet Count: 261 10*3/uL (ref 150–400)
RBC: 3.42 MIL/uL — ABNORMAL LOW (ref 3.87–5.11)
RDW: 18.6 % — ABNORMAL HIGH (ref 11.5–15.5)
WBC Count: 5.4 10*3/uL (ref 4.0–10.5)
nRBC: 0 % (ref 0.0–0.2)

## 2019-07-19 LAB — CMP (CANCER CENTER ONLY)
ALT: 16 U/L (ref 0–44)
AST: 17 U/L (ref 15–41)
Albumin: 3.9 g/dL (ref 3.5–5.0)
Alkaline Phosphatase: 75 U/L (ref 38–126)
Anion gap: 9 (ref 5–15)
BUN: 8 mg/dL (ref 6–20)
CO2: 24 mmol/L (ref 22–32)
Calcium: 8.9 mg/dL (ref 8.9–10.3)
Chloride: 107 mmol/L (ref 98–111)
Creatinine: 0.75 mg/dL (ref 0.44–1.00)
GFR, Est AFR Am: >60 mL/min (ref >60)
GFR, Estimated: >60 mL/min (ref >60)
Glucose, Bld: 127 mg/dL — ABNORMAL HIGH (ref 70–99)
Potassium: 3.7 mmol/L (ref 3.5–5.1)
Sodium: 140 mmol/L (ref 135–145)
Total Bilirubin: 0.4 mg/dL (ref 0.3–1.2)
Total Protein: 6.9 g/dL (ref 6.5–8.1)

## 2019-07-19 MED ORDER — DIPHENHYDRAMINE HCL 50 MG/ML IJ SOLN
INTRAMUSCULAR | Status: AC
  Filled 2019-07-19: qty 1

## 2019-07-19 MED ORDER — FAMOTIDINE IN NACL 20-0.9 MG/50ML-% IV SOLN
20.0000 mg | Freq: Once | INTRAVENOUS | Status: AC
  Administered 2019-07-19: 20 mg via INTRAVENOUS

## 2019-07-19 MED ORDER — SODIUM CHLORIDE 0.9 % IV SOLN
20.0000 mg | Freq: Once | INTRAVENOUS | Status: AC
  Administered 2019-07-19: 20 mg via INTRAVENOUS
  Filled 2019-07-19: qty 2

## 2019-07-19 MED ORDER — FAMOTIDINE IN NACL 20-0.9 MG/50ML-% IV SOLN
INTRAVENOUS | Status: AC
  Filled 2019-07-19: qty 50

## 2019-07-19 MED ORDER — SODIUM CHLORIDE 0.9% FLUSH
10.0000 mL | INTRAVENOUS | Status: DC | PRN
  Administered 2019-07-19: 10 mL
  Filled 2019-07-19: qty 10

## 2019-07-19 MED ORDER — DIPHENHYDRAMINE HCL 50 MG/ML IJ SOLN
50.0000 mg | Freq: Once | INTRAMUSCULAR | Status: AC
  Administered 2019-07-19: 50 mg via INTRAVENOUS

## 2019-07-19 MED ORDER — SODIUM CHLORIDE 0.9 % IV SOLN
80.0000 mg/m2 | Freq: Once | INTRAVENOUS | Status: AC
  Administered 2019-07-19: 126 mg via INTRAVENOUS
  Filled 2019-07-19: qty 21

## 2019-07-19 MED ORDER — SODIUM CHLORIDE 0.9% FLUSH
10.0000 mL | Freq: Once | INTRAVENOUS | Status: AC
  Administered 2019-07-19: 10 mL
  Filled 2019-07-19: qty 10

## 2019-07-19 MED ORDER — SODIUM CHLORIDE 0.9 % IV SOLN
Freq: Once | INTRAVENOUS | Status: AC
  Administered 2019-07-19: 09:00:00 via INTRAVENOUS
  Filled 2019-07-19: qty 250

## 2019-07-19 MED ORDER — HEPARIN SOD (PORK) LOCK FLUSH 100 UNIT/ML IV SOLN
500.0000 [IU] | Freq: Once | INTRAVENOUS | Status: AC | PRN
  Administered 2019-07-19: 500 [IU]
  Filled 2019-07-19: qty 5

## 2019-07-19 NOTE — Patient Instructions (Signed)
[facility] Discharge Instructions for Patients Receiving Chemotherapy  Today you received the following chemotherapy agents :  Taxol.  To help prevent nausea and vomiting after your treatment, we encourage you to take your nausea medication as prescribed.   If you develop nausea and vomiting that is not controlled by your nausea medication, call the clinic.   BELOW ARE SYMPTOMS THAT SHOULD BE REPORTED IMMEDIATELY:  *FEVER GREATER THAN 100.5 F  *CHILLS WITH OR WITHOUT FEVER  NAUSEA AND VOMITING THAT IS NOT CONTROLLED WITH YOUR NAUSEA MEDICATION  *UNUSUAL SHORTNESS OF BREATH  *UNUSUAL BRUISING OR BLEEDING  TENDERNESS IN MOUTH AND THROAT WITH OR WITHOUT PRESENCE OF ULCERS  *URINARY PROBLEMS  *BOWEL PROBLEMS  UNUSUAL RASH Items with * indicate a potential emergency and should be followed up as soon as possible.  Feel free to call the clinic should you have any questions or concerns. The clinic phone number is [phone].  Please show the CHEMO ALERT CARD at check-in to the Emergency Department and triage nurse.   

## 2019-07-25 NOTE — Progress Notes (Signed)
Patient Care Team: Tawny Asal as PCP - General (Physician Assistant) Mauro Kaufmann, RN as Oncology Nurse Navigator Rockwell Germany, RN as Oncology Nurse Navigator Alphonsa Overall, MD as Consulting Physician (General Surgery) Nicholas Lose, MD as Consulting Physician (Hematology and Oncology) Gery Pray, MD as Consulting Physician (Radiation Oncology)  DIAGNOSIS:    ICD-10-CM   1. Malignant neoplasm of upper-inner quadrant of left breast in female, estrogen receptor positive ([place])  C50.212    Z17.0     SUMMARY OF ONCOLOGIC HISTORY: Oncology History  Malignant neoplasm of upper-inner quadrant of left breast in female, estrogen receptor positive ([place])  02/07/2019 Initial Diagnosis   Diagnostic mammogram following palpable lump in the left breast x6 months showed 3.0cm mass at the 9 oc'clock position 2cm from the nipple. Biopsy confirmed grade 3 IDC, HER2 negative by FISH, ER+ (100%), PR+ (80%), Ki67 20%.    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    03/29/2019 Cancer Staging   Staging form: Breast, AJCC 8th Edition - Pathologic stage from 03/29/2019: Stage IIA (pT2, pN1a, cM0, G3, ER+, PR+, HER2-) - Signed by Nicholas Lose, MD on 03/29/2019   04/26/2019 -  Chemotherapy   Adj chemo with AC-T     CHIEF COMPLIANT: Cycle6Taxol  INTERVAL HISTORY: Linda Spears is a 58 y.o. with above-mentioned history of breast cancer who underwent a lumpectomy and is currently on adjuvant chemotherapy treatment. She completed 4 cycles of dosedense Adriamycin and Cytoxanand is currently receiving weekly Taxol treatments.She is a participant in the  neuropathy clinical trial. She presents to the clinic todayforcycle6 and a toxicity check.  REVIEW OF SYSTEMS:   Constitutional: Denies fevers, chills or abnormal weight loss Eyes: Denies blurriness of vision Ears, nose, mouth, throat, and face: Denies mucositis or sore throat Respiratory: Denies cough, dyspnea or wheezes Cardiovascular: Denies palpitation, chest discomfort Gastrointestinal: Denies nausea, heartburn or change in bowel habits Skin: Denies abnormal skin rashes Lymphatics: Denies new lymphadenopathy or easy bruising Neurological: Denies numbness, tingling or new weaknesses Behavioral/Psych: Mood is stable, no new changes  Extremities: No lower extremity edema Breast: denies any pain or lumps or nodules in either breasts All other systems were reviewed with the patient and are negative.  I have reviewed the past medical history, past surgical history, social history and family history with the patient and they are unchanged from previous note.  ALLERGIES:  has No Known Allergies.  MEDICATIONS:  Current Outpatient Medications  Medication Sig Dispense Refill   acetaminophen (TYLENOL) 500 MG tablet Take 1,000 mg by mouth every 6 (six) hours as needed for moderate pain.     lidocaine-prilocaine (EMLA) cream Apply to affected area once 30 g 3   LORazepam (ATIVAN) 0.5 MG tablet Take 1 tablet (0.5 mg total) by mouth at bedtime as needed for sleep. 30 tablet 0   ondansetron (ZOFRAN) 8 MG tablet Take 1 tablet (8 mg total) by mouth 2 (two) times daily as needed. Start on the third day after chemotherapy. 30 tablet 1   prochlorperazine (COMPAZINE) 10 MG tablet Take 1 tablet (10 mg total) by mouth every 6 (six) hours as needed (Nausea or vomiting). 30 tablet  1   No current facility-administered medications for this visit.    Facility-Administered Medications Ordered in Other Visits  Medication Dose Route Frequency Provider Last Rate Last Dose   pegfilgrastim (NEULASTA)  injection 6 mg  6 mg Subcutaneous Once Nicholas Lose, MD        PHYSICAL EXAMINATION: ECOG PERFORMANCE STATUS: 1 - Symptomatic but completely ambulatory  Vitals:   07/26/19 1130  BP: (!) 153/84  Pulse: 69  Resp: 18  Temp: 98.2 F (36.8 C)  SpO2: 100%   Filed Weights   07/26/19 1130  Weight: 121 lb 9.6 oz (55.2 kg)    GENERAL: alert, no distress and comfortable SKIN: skin color, texture, turgor are normal, no rashes or significant lesions EYES: normal, Conjunctiva are pink and non-injected, sclera clear OROPHARYNX: no exudate, no erythema and lips, buccal mucosa, and tongue normal  NECK: supple, thyroid normal size, non-tender, without nodularity LYMPH: no palpable lymphadenopathy in the cervical, axillary or inguinal LUNGS: clear to auscultation and percussion with normal breathing effort HEART: regular rate & rhythm and no murmurs and no lower extremity edema ABDOMEN: abdomen soft, non-tender and normal bowel sounds MUSCULOSKELETAL: no cyanosis of digits and no clubbing  NEURO: alert & oriented x 3 with fluent speech, no focal motor/sensory deficits EXTREMITIES: No lower extremity edema  LABORATORY DATA:  I have reviewed the data as listed CMP Latest Ref Rng & Units 07/26/2019 07/19/2019 07/12/2019  Glucose 70 - 99 mg/dL 98 127(H) 129(H)  BUN 6 - 20 mg/dL 6 8 8   Creatinine 0.44 - 1.00 mg/dL 0.74 0.75 0.81  Sodium 135 - 145 mmol/L 138 140 140  Potassium 3.5 - 5.1 mmol/L 4.1 3.7 3.7  Chloride 98 - 111 mmol/L 107 107 107  CO2 22 - 32 mmol/L 25 24 20(L)  Calcium 8.9 - 10.3 mg/dL 9.1 8.9 8.9  Total Protein 6.5 - 8.1 g/dL 7.0 6.9 7.0  Total Bilirubin 0.3 - 1.2 mg/dL 0.2(L) 0.4 <0.2(L)  Alkaline Phos 38 - 126 U/L 77 75 78  AST 15 - 41 U/L 19 17 18   ALT 0 - 44 U/L 19 16 22     Lab Results  Component Value Date   WBC 5.8 07/26/2019   HGB 9.8 (L) 07/26/2019   HCT 31.2 (L) 07/26/2019   MCV 92.0 07/26/2019   PLT 298 07/26/2019   NEUTROABS 3.4 07/26/2019    ASSESSMENT &  PLAN:  Malignant neoplasm of upper-inner quadrant of left breast in female, estrogen receptor positive ([place]) 03/14/2019:Left lumpectomy [name]): IDC, 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Adjuvant chemotherapy with dose dense Adriamycin and Cytoxan x4 followed by Taxol x12 2.Followed by adjuvant radiation 3.Followed by adjuvant antiestrogen therapy No adverse effects due to participation in the upbeat clinical trial ----------------------------------------------------------- Current treatment:Completed 4 cycles ofdose dense Adriamycin and Cytoxan, today is cycle6Taxol Echocardiogram 04/15/2019: EF 50 to 55%.  Chemo toxicities: Chemotherapy-induced anemia: Monitoring her hemoglobin. Today's hemoglobin is 9.8. Monitoring closely for neuropathy. Extremely mild comes and goes of the tips of the fingers Mild nausea  Enrolled inSWOG 1714to prospectively evaluate for peripheral neuropathy. Return to clinicweekly for Taxol.We will see her every other week    No orders of the defined types were placed in this encounter.  The patient has a good understanding of the overall plan. she agrees with it. she  will call with any problems that may develop before the next visit here.  Nicholas Lose, MD 07/26/2019  Julious Oka Dorshimer, am acting as scribe for Dr. Nicholas Lose.  I have reviewed the above documentation for accuracy and completeness, and I agree with the above.

## 2019-07-26 ENCOUNTER — Inpatient Hospital Stay: Payer: Medicaid Other

## 2019-07-26 ENCOUNTER — Inpatient Hospital Stay: Payer: Medicaid Other | Admitting: *Deleted

## 2019-07-26 ENCOUNTER — Inpatient Hospital Stay: Payer: Medicaid Other | Attending: Hematology and Oncology

## 2019-07-26 ENCOUNTER — Other Ambulatory Visit: Payer: Self-pay

## 2019-07-26 ENCOUNTER — Inpatient Hospital Stay (HOSPITAL_BASED_OUTPATIENT_CLINIC_OR_DEPARTMENT_OTHER): Payer: Medicaid Other | Admitting: Hematology and Oncology

## 2019-07-26 DIAGNOSIS — Z170 Estrogen receptor positive status [ER+]: Secondary | ICD-10-CM

## 2019-07-26 DIAGNOSIS — D6481 Anemia due to antineoplastic chemotherapy: Secondary | ICD-10-CM | POA: Diagnosis not present

## 2019-07-26 DIAGNOSIS — Z5111 Encounter for antineoplastic chemotherapy: Secondary | ICD-10-CM | POA: Insufficient documentation

## 2019-07-26 DIAGNOSIS — C50212 Malignant neoplasm of upper-inner quadrant of left female breast: Secondary | ICD-10-CM

## 2019-07-26 DIAGNOSIS — T451X5A Adverse effect of antineoplastic and immunosuppressive drugs, initial encounter: Secondary | ICD-10-CM | POA: Insufficient documentation

## 2019-07-26 DIAGNOSIS — G620 Drug-induced polyneuropathy: Secondary | ICD-10-CM | POA: Diagnosis not present

## 2019-07-26 DIAGNOSIS — Z006 Encounter for examination for normal comparison and control in clinical research program: Secondary | ICD-10-CM | POA: Insufficient documentation

## 2019-07-26 DIAGNOSIS — Z95828 Presence of other vascular implants and grafts: Secondary | ICD-10-CM

## 2019-07-26 LAB — CBC WITH DIFFERENTIAL (CANCER CENTER ONLY)
Abs Immature Granulocytes: 0.06 10*3/uL (ref 0.00–0.07)
Basophils Absolute: 0 10*3/uL (ref 0.0–0.1)
Basophils Relative: 1 %
Eosinophils Absolute: 0.1 10*3/uL (ref 0.0–0.5)
Eosinophils Relative: 2 %
HCT: 31.2 % — ABNORMAL LOW (ref 36.0–46.0)
Hemoglobin: 9.8 g/dL — ABNORMAL LOW (ref 12.0–15.0)
Immature Granulocytes: 1 %
Lymphocytes Relative: 26 %
Lymphs Abs: 1.5 10*3/uL (ref 0.7–4.0)
MCH: 28.9 pg (ref 26.0–34.0)
MCHC: 31.4 g/dL (ref 30.0–36.0)
MCV: 92 fL (ref 80.0–100.0)
Monocytes Absolute: 0.7 10*3/uL (ref 0.1–1.0)
Monocytes Relative: 12 %
Neutro Abs: 3.4 10*3/uL (ref 1.7–7.7)
Neutrophils Relative %: 58 %
Platelet Count: 298 10*3/uL (ref 150–400)
RBC: 3.39 MIL/uL — ABNORMAL LOW (ref 3.87–5.11)
RDW: 19 % — ABNORMAL HIGH (ref 11.5–15.5)
WBC Count: 5.8 10*3/uL (ref 4.0–10.5)
nRBC: 0 % (ref 0.0–0.2)

## 2019-07-26 LAB — CMP (CANCER CENTER ONLY)
ALT: 19 U/L (ref 0–44)
AST: 19 U/L (ref 15–41)
Albumin: 4.1 g/dL (ref 3.5–5.0)
Alkaline Phosphatase: 77 U/L (ref 38–126)
Anion gap: 6 (ref 5–15)
BUN: 6 mg/dL (ref 6–20)
CO2: 25 mmol/L (ref 22–32)
Calcium: 9.1 mg/dL (ref 8.9–10.3)
Chloride: 107 mmol/L (ref 98–111)
Creatinine: 0.74 mg/dL (ref 0.44–1.00)
GFR, Est AFR Am: >60 mL/min (ref >60)
GFR, Estimated: >60 mL/min (ref >60)
Glucose, Bld: 98 mg/dL (ref 70–99)
Potassium: 4.1 mmol/L (ref 3.5–5.1)
Sodium: 138 mmol/L (ref 135–145)
Total Bilirubin: 0.2 mg/dL — ABNORMAL LOW (ref 0.3–1.2)
Total Protein: 7 g/dL (ref 6.5–8.1)

## 2019-07-26 MED ORDER — FAMOTIDINE IN NACL 20-0.9 MG/50ML-% IV SOLN
20.0000 mg | Freq: Once | INTRAVENOUS | Status: AC
  Administered 2019-07-26: 20 mg via INTRAVENOUS

## 2019-07-26 MED ORDER — SODIUM CHLORIDE 0.9 % IV SOLN
80.0000 mg/m2 | Freq: Once | INTRAVENOUS | Status: AC
  Administered 2019-07-26: 14:00:00 126 mg via INTRAVENOUS
  Filled 2019-07-26: qty 21

## 2019-07-26 MED ORDER — DIPHENHYDRAMINE HCL 50 MG/ML IJ SOLN
INTRAMUSCULAR | Status: AC
  Filled 2019-07-26: qty 1

## 2019-07-26 MED ORDER — SODIUM CHLORIDE 0.9 % IV SOLN
Freq: Once | INTRAVENOUS | Status: AC
  Administered 2019-07-26: 13:00:00 via INTRAVENOUS
  Filled 2019-07-26: qty 250

## 2019-07-26 MED ORDER — SODIUM CHLORIDE 0.9% FLUSH
10.0000 mL | Freq: Once | INTRAVENOUS | Status: AC
  Administered 2019-07-26: 10 mL
  Filled 2019-07-26: qty 10

## 2019-07-26 MED ORDER — SODIUM CHLORIDE 0.9% FLUSH
10.0000 mL | INTRAVENOUS | Status: DC | PRN
  Administered 2019-07-26: 10 mL
  Filled 2019-07-26: qty 10

## 2019-07-26 MED ORDER — FAMOTIDINE IN NACL 20-0.9 MG/50ML-% IV SOLN
INTRAVENOUS | Status: AC
  Filled 2019-07-26: qty 50

## 2019-07-26 MED ORDER — DIPHENHYDRAMINE HCL 50 MG/ML IJ SOLN
50.0000 mg | Freq: Once | INTRAMUSCULAR | Status: AC
  Administered 2019-07-26: 50 mg via INTRAVENOUS

## 2019-07-26 MED ORDER — HEPARIN SOD (PORK) LOCK FLUSH 100 UNIT/ML IV SOLN
500.0000 [IU] | Freq: Once | INTRAVENOUS | Status: AC | PRN
  Administered 2019-07-26: 500 [IU]
  Filled 2019-07-26: qty 5

## 2019-07-26 MED ORDER — SODIUM CHLORIDE 0.9 % IV SOLN
20.0000 mg | Freq: Once | INTRAVENOUS | Status: AC
  Administered 2019-07-26: 20 mg via INTRAVENOUS
  Filled 2019-07-26: qty 20

## 2019-07-26 NOTE — Research (Signed)
[id number], A PROSPECTIVE OBSERVATIONAL COHORT STUDY TO DEVELOP A PREDICTIVE MODEL OF TAXANE-INDUCED PERIPHERAL NEUROPATHY IN CANCER PATIENTS. 4 WEEKS VISIT: PROs; Questionnaires were given to patient to complete in clinic prior to lab appointment. Collected questionnaires and checked for completeness and accuracy.  History of Falls;  Not required this visit.  Solicited Neuropathy Events; Patient reports she started feeling intermittent numbness and tingling in fingertips and bottom of her feet about 1 week ago. It does not interfere with any functioning or ADLs.  Grade 1 Paresthesia reported on CRF and Dr. Lindi Adie agrees and states this is related to Taxol.  Treatment; Pacitaxel cycle 6 today without any dose adjustments.  Assessment for Interventions for CIPN; Reviewed with patient and CRFs completed. Patient is icing her hands and feet during Taxol infusion to help prevent neuropathy. Neuropen Assessment; Completed per protocol by this research RN with assistance timing and recording by Doristine Johns, Electrical engineer.  [place] Assessment; Completed per protocol by this research RN with assistance timing and recording by Doristine Johns, Electrical engineer.  Timed Get Up and Go Test; Not required this visit.   Physician Assessments; Treatment [place] form completed and signed by Dr. Lindi Adie today based on his assessment of seeing patient in clinic today. Plan; Informed patient of next study assessments planned for week 8 on 08/23/19 and will be done on same day as treatment. Patient verbalized understanding.  Thanked patient for participating in this study today. Encouraged her to call clinic if any questions or concerns prior to next appointment. She verbalized understanding. Foye Spurling, BSN, RN Clinical Research Nurse 07/26/2019 12:01 PM

## 2019-07-26 NOTE — Patient Instructions (Signed)
[facility] Discharge Instructions for Patients Receiving Chemotherapy  Today you received the following chemotherapy agents:  Taxol.  To help prevent nausea and vomiting after your treatment, we encourage you to take your nausea medication as directed.   If you develop nausea and vomiting that is not controlled by your nausea medication, call the clinic.   BELOW ARE SYMPTOMS THAT SHOULD BE REPORTED IMMEDIATELY:  *FEVER GREATER THAN 100.5 F  *CHILLS WITH OR WITHOUT FEVER  NAUSEA AND VOMITING THAT IS NOT CONTROLLED WITH YOUR NAUSEA MEDICATION  *UNUSUAL SHORTNESS OF BREATH  *UNUSUAL BRUISING OR BLEEDING  TENDERNESS IN MOUTH AND THROAT WITH OR WITHOUT PRESENCE OF ULCERS  *URINARY PROBLEMS  *BOWEL PROBLEMS  UNUSUAL RASH Items with * indicate a potential emergency and should be followed up as soon as possible.  Feel free to call the clinic should you have any questions or concerns. The clinic phone number is [phone].  Please show the CHEMO ALERT CARD at check-in to the Emergency Department and triage nurse.   

## 2019-07-26 NOTE — Assessment & Plan Note (Signed)
03/14/2019:Left lumpectomy [name]): IDC, 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Adjuvant chemotherapy with dose dense Adriamycin and Cytoxan x4 followed by Taxol x12 2.Followed by adjuvant radiation 3.Followed by adjuvant antiestrogen therapy No adverse effects due to participation in the upbeat clinical trial ----------------------------------------------------------- Current treatment:Completed 4 cycles ofdose dense Adriamycin and Cytoxan, today is cycle6Taxol Echocardiogram 04/15/2019: EF 50 to 55%.  Chemo toxicities: Chemotherapy-induced anemia: Monitoring her hemoglobin. Today's hemoglobin is 8.9. Monitoring closely for neuropathy. Mild nausea  Enrolled inSWOG 1714to prospectively evaluate for peripheral neuropathy. Return to clinicweekly for Taxol.We will see her every other week

## 2019-08-02 ENCOUNTER — Inpatient Hospital Stay: Payer: Medicaid Other

## 2019-08-02 ENCOUNTER — Other Ambulatory Visit: Payer: Self-pay

## 2019-08-02 VITALS — BP 138/78 | HR 54 | Temp 97.8°F | Resp 18

## 2019-08-02 DIAGNOSIS — Z95828 Presence of other vascular implants and grafts: Secondary | ICD-10-CM

## 2019-08-02 DIAGNOSIS — Z170 Estrogen receptor positive status [ER+]: Secondary | ICD-10-CM

## 2019-08-02 DIAGNOSIS — C50212 Malignant neoplasm of upper-inner quadrant of left female breast: Secondary | ICD-10-CM

## 2019-08-02 DIAGNOSIS — Z5111 Encounter for antineoplastic chemotherapy: Secondary | ICD-10-CM | POA: Diagnosis not present

## 2019-08-02 LAB — CBC WITH DIFFERENTIAL (CANCER CENTER ONLY)
Abs Immature Granulocytes: 0.07 10*3/uL (ref 0.00–0.07)
Basophils Absolute: 0.1 10*3/uL (ref 0.0–0.1)
Basophils Relative: 1 %
Eosinophils Absolute: 0.1 10*3/uL (ref 0.0–0.5)
Eosinophils Relative: 2 %
HCT: 31.5 % — ABNORMAL LOW (ref 36.0–46.0)
Hemoglobin: 9.8 g/dL — ABNORMAL LOW (ref 12.0–15.0)
Immature Granulocytes: 1 %
Lymphocytes Relative: 26 %
Lymphs Abs: 1.4 10*3/uL (ref 0.7–4.0)
MCH: 28.3 pg (ref 26.0–34.0)
MCHC: 31.1 g/dL (ref 30.0–36.0)
MCV: 91 fL (ref 80.0–100.0)
Monocytes Absolute: 0.5 10*3/uL (ref 0.1–1.0)
Monocytes Relative: 9 %
Neutro Abs: 3.3 10*3/uL (ref 1.7–7.7)
Neutrophils Relative %: 61 %
Platelet Count: 291 10*3/uL (ref 150–400)
RBC: 3.46 MIL/uL — ABNORMAL LOW (ref 3.87–5.11)
RDW: 17.8 % — ABNORMAL HIGH (ref 11.5–15.5)
WBC Count: 5.5 10*3/uL (ref 4.0–10.5)
nRBC: 0 % (ref 0.0–0.2)

## 2019-08-02 LAB — CMP (CANCER CENTER ONLY)
ALT: 16 U/L (ref 0–44)
AST: 19 U/L (ref 15–41)
Albumin: 4 g/dL (ref 3.5–5.0)
Alkaline Phosphatase: 75 U/L (ref 38–126)
Anion gap: 7 (ref 5–15)
BUN: 8 mg/dL (ref 6–20)
CO2: 26 mmol/L (ref 22–32)
Calcium: 9 mg/dL (ref 8.9–10.3)
Chloride: 107 mmol/L (ref 98–111)
Creatinine: 0.77 mg/dL (ref 0.44–1.00)
GFR, Est AFR Am: >60 mL/min (ref >60)
GFR, Estimated: >60 mL/min (ref >60)
Glucose, Bld: 99 mg/dL (ref 70–99)
Potassium: 4.3 mmol/L (ref 3.5–5.1)
Sodium: 140 mmol/L (ref 135–145)
Total Bilirubin: 0.3 mg/dL (ref 0.3–1.2)
Total Protein: 7 g/dL (ref 6.5–8.1)

## 2019-08-02 MED ORDER — SODIUM CHLORIDE 0.9 % IV SOLN
20.0000 mg | Freq: Once | INTRAVENOUS | Status: AC
  Administered 2019-08-02: 20 mg via INTRAVENOUS
  Filled 2019-08-02: qty 20

## 2019-08-02 MED ORDER — SODIUM CHLORIDE 0.9 % IV SOLN
80.0000 mg/m2 | Freq: Once | INTRAVENOUS | Status: AC
  Administered 2019-08-02: 126 mg via INTRAVENOUS
  Filled 2019-08-02: qty 21

## 2019-08-02 MED ORDER — DIPHENHYDRAMINE HCL 50 MG/ML IJ SOLN
50.0000 mg | Freq: Once | INTRAMUSCULAR | Status: AC
  Administered 2019-08-02: 50 mg via INTRAVENOUS

## 2019-08-02 MED ORDER — FAMOTIDINE IN NACL 20-0.9 MG/50ML-% IV SOLN
INTRAVENOUS | Status: AC
  Filled 2019-08-02: qty 50

## 2019-08-02 MED ORDER — SODIUM CHLORIDE 0.9% FLUSH
10.0000 mL | INTRAVENOUS | Status: DC | PRN
  Administered 2019-08-02: 10 mL
  Filled 2019-08-02: qty 10

## 2019-08-02 MED ORDER — SODIUM CHLORIDE 0.9% FLUSH
10.0000 mL | Freq: Once | INTRAVENOUS | Status: AC
  Administered 2019-08-02: 09:00:00 10 mL
  Filled 2019-08-02: qty 10

## 2019-08-02 MED ORDER — DIPHENHYDRAMINE HCL 50 MG/ML IJ SOLN
INTRAMUSCULAR | Status: AC
  Filled 2019-08-02: qty 1

## 2019-08-02 MED ORDER — HEPARIN SOD (PORK) LOCK FLUSH 100 UNIT/ML IV SOLN
500.0000 [IU] | Freq: Once | INTRAVENOUS | Status: AC | PRN
  Administered 2019-08-02: 500 [IU]
  Filled 2019-08-02: qty 5

## 2019-08-02 MED ORDER — FAMOTIDINE IN NACL 20-0.9 MG/50ML-% IV SOLN
20.0000 mg | Freq: Once | INTRAVENOUS | Status: AC
  Administered 2019-08-02: 20 mg via INTRAVENOUS

## 2019-08-02 MED ORDER — SODIUM CHLORIDE 0.9 % IV SOLN
Freq: Once | INTRAVENOUS | Status: AC
  Administered 2019-08-02: 09:00:00 via INTRAVENOUS
  Filled 2019-08-02: qty 250

## 2019-08-02 NOTE — Patient Instructions (Signed)
[facility] Discharge Instructions for Patients Receiving Chemotherapy  Today you received the following chemotherapy agents:  Taxol.  To help prevent nausea and vomiting after your treatment, we encourage you to take your nausea medication as directed.   If you develop nausea and vomiting that is not controlled by your nausea medication, call the clinic.   BELOW ARE SYMPTOMS THAT SHOULD BE REPORTED IMMEDIATELY:  *FEVER GREATER THAN 100.5 F  *CHILLS WITH OR WITHOUT FEVER  NAUSEA AND VOMITING THAT IS NOT CONTROLLED WITH YOUR NAUSEA MEDICATION  *UNUSUAL SHORTNESS OF BREATH  *UNUSUAL BRUISING OR BLEEDING  TENDERNESS IN MOUTH AND THROAT WITH OR WITHOUT PRESENCE OF ULCERS  *URINARY PROBLEMS  *BOWEL PROBLEMS  UNUSUAL RASH Items with * indicate a potential emergency and should be followed up as soon as possible.  Feel free to call the clinic should you have any questions or concerns. The clinic phone number is [phone].  Please show the CHEMO ALERT CARD at check-in to the Emergency Department and triage nurse.   

## 2019-08-08 NOTE — Progress Notes (Signed)
Patient Care Team: Tawny Asal as PCP - General (Physician Assistant) Mauro Kaufmann, RN as Oncology Nurse Navigator Rockwell Germany, RN as Oncology Nurse Navigator Alphonsa Overall, MD as Consulting Physician (General Surgery) Nicholas Lose, MD as Consulting Physician (Hematology and Oncology) Gery Pray, MD as Consulting Physician (Radiation Oncology)  DIAGNOSIS:    ICD-10-CM   1. Malignant neoplasm of upper-inner quadrant of left breast in female, estrogen receptor positive (Linda Spears)  C50.212    Z17.0     SUMMARY OF ONCOLOGIC HISTORY: Oncology History  Malignant neoplasm of upper-inner quadrant of left breast in female, estrogen receptor positive (Linda Spears)  02/07/2019 Initial Diagnosis   Diagnostic mammogram following palpable lump in the left breast x6 months showed 3.0cm mass at the 9 oc'clock position 2cm from the nipple. Biopsy confirmed grade 3 IDC, HER2 negative by FISH, ER+ (100%), PR+ (80%), Ki67 20%.    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    03/29/2019 Cancer Staging   Staging form: Breast, AJCC 8th Edition - Pathologic stage from 03/29/2019: Stage IIA (pT2, pN1a, cM0, G3, ER+, PR+, HER2-) - Signed by Nicholas Lose, MD on 03/29/2019   04/26/2019 -  Chemotherapy   Adj chemo with AC-T     CHIEF COMPLIANT: Cycle8Taxol  INTERVAL HISTORY: Linda Spears is a 58 y.o. with above-mentioned history of breast cancer who underwent a lumpectomy and is currently on adjuvant chemotherapy treatment. She completed 4 cycles of dosedense Adriamycin and Cytoxanand is currently receiving weekly Taxol treatments.She is a participant in the  neuropathy clinical trial.She presents to the clinic todayforcycle8and a toxicity check.  REVIEW OF SYSTEMS:   Constitutional: Denies fevers, chills or abnormal weight loss Eyes: Denies blurriness of vision Ears, nose, mouth, throat, and face: Denies mucositis or sore throat Respiratory: Denies cough, dyspnea or wheezes Cardiovascular: Denies palpitation, chest discomfort Gastrointestinal: Denies nausea, heartburn or change in bowel habits Skin: Denies abnormal skin rashes Lymphatics: Denies new lymphadenopathy or easy bruising Neurological: Denies numbness, tingling or new weaknesses Behavioral/Psych: Mood is stable, no new changes  Extremities: No lower extremity edema Breast: denies any pain or lumps or nodules in either breasts All other systems were reviewed with the patient and are negative.  I have reviewed the past medical history, past surgical history, social history and family history with the patient and they are unchanged from previous note.  ALLERGIES:  has No Known Allergies.  MEDICATIONS:  Current Outpatient Medications  Medication Sig Dispense Refill  . acetaminophen (TYLENOL) 500 MG tablet Take 1,000 mg by mouth every 6 (six) hours as needed for moderate pain.    Marland Kitchen lidocaine-prilocaine (EMLA) cream Apply to affected area once 30 g 3  . LORazepam (ATIVAN) 0.5 MG tablet Take 1 tablet (0.5 mg total) by mouth at bedtime as needed for sleep. 30 tablet 0  . ondansetron (ZOFRAN) 8 MG tablet Take 1 tablet (8 mg total) by mouth 2 (two) times daily as needed. Start on the third day after chemotherapy. 30 tablet 1  . prochlorperazine (COMPAZINE) 10 MG tablet Take 1 tablet (10 mg total) by mouth every 6 (six) hours as needed (Nausea or vomiting). 30 tablet 1  No current facility-administered medications for this visit.   Facility-Administered Medications Ordered in Other Visits  Medication Dose Route Frequency Provider Last Rate Last Admin  . pegfilgrastim (NEULASTA)  injection 6 mg  6 mg Subcutaneous Once Nicholas Lose, MD        PHYSICAL EXAMINATION: ECOG PERFORMANCE STATUS: 1 - Symptomatic but completely ambulatory  There were no vitals filed for this visit. There were no vitals filed for this visit.  GENERAL: alert, no distress and comfortable SKIN: skin color, texture, turgor are normal, no rashes or significant lesions EYES: normal, Conjunctiva are pink and non-injected, sclera clear OROPHARYNX: no exudate, no erythema and lips, buccal mucosa, and tongue normal  NECK: supple, thyroid normal size, non-tender, without nodularity LYMPH: no palpable lymphadenopathy in the cervical, axillary or inguinal LUNGS: clear to auscultation and percussion with normal breathing effort HEART: regular rate & rhythm and no murmurs and no lower extremity edema ABDOMEN: abdomen soft, non-tender and normal bowel sounds MUSCULOSKELETAL: no cyanosis of digits and no clubbing  NEURO: alert & oriented x 3 with fluent speech, no focal motor/sensory deficits EXTREMITIES: No lower extremity edema  LABORATORY DATA:  I have reviewed the data as listed CMP Latest Ref Rng & Units 08/02/2019 07/26/2019 07/19/2019  Glucose 70 - 99 mg/dL 99 98 127(H)  BUN 6 - 20 mg/dL 8 6 8   Creatinine 0.44 - 1.00 mg/dL 0.77 0.74 0.75  Sodium 135 - 145 mmol/L 140 138 140  Potassium 3.5 - 5.1 mmol/L 4.3 4.1 3.7  Chloride 98 - 111 mmol/L 107 107 107  CO2 22 - 32 mmol/L 26 25 24   Calcium 8.9 - 10.3 mg/dL 9.0 9.1 8.9  Total Protein 6.5 - 8.1 g/dL 7.0 7.0 6.9  Total Bilirubin 0.3 - 1.2 mg/dL 0.3 0.2(L) 0.4  Alkaline Phos 38 - 126 U/L 75 77 75  AST 15 - 41 U/L 19 19 17   ALT 0 - 44 U/L 16 19 16     Lab Results  Component Value Date   WBC 5.1 08/09/2019   HGB 10.4 (L) 08/09/2019   HCT 33.1 (L) 08/09/2019   MCV 93.5 08/09/2019   PLT 339 08/09/2019   NEUTROABS 3.2 08/09/2019    ASSESSMENT & PLAN:  Malignant neoplasm of upper-inner quadrant of left breast in female, estrogen receptor  positive ([name]) 03/14/2019:Left lumpectomy [name]): IDC, 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Adjuvant chemotherapy with dose dense Adriamycin and Cytoxan x4 followed by Taxol x12 2.Followed by adjuvant radiation 3.Followed by adjuvant antiestrogen therapy No adverse effects due to participation in the upbeat clinical trial ----------------------------------------------------------- Current treatment:Completed 4 cycles ofdose dense Adriamycin and Cytoxan, today is cycle8Taxol Echocardiogram 04/15/2019: EF 50 to 55%.  Chemo toxicities: Chemotherapy-induced anemia: Monitoring her hemoglobin. Today's hemoglobin is 9.8. Monitoring closely for neuropathy. Extremely mild comes and goes of the tips of the fingers especially in the left hand.  It is intermittent. Mild nausea  Enrolled inSWOG 1714to prospectively evaluate for peripheral neuropathy. Return to clinicweekly for Taxol.We will see her every other week   No orders of the defined types were placed in this encounter.  The patient has a good understanding of the overall plan. she agrees with it. she will call with any problems that may develop before the next visit here.  Nicholas Lose, MD 08/09/2019  Julious Oka Dorshimer,  am acting as scribe for Dr. Nicholas Lose.  I have reviewed the above document for accuracy and completeness, and I agree with the above.

## 2019-08-09 ENCOUNTER — Inpatient Hospital Stay: Payer: Medicaid Other

## 2019-08-09 ENCOUNTER — Other Ambulatory Visit: Payer: Self-pay

## 2019-08-09 ENCOUNTER — Inpatient Hospital Stay (HOSPITAL_BASED_OUTPATIENT_CLINIC_OR_DEPARTMENT_OTHER): Payer: Medicaid Other | Admitting: Hematology and Oncology

## 2019-08-09 DIAGNOSIS — Z5111 Encounter for antineoplastic chemotherapy: Secondary | ICD-10-CM | POA: Diagnosis not present

## 2019-08-09 DIAGNOSIS — Z95828 Presence of other vascular implants and grafts: Secondary | ICD-10-CM

## 2019-08-09 DIAGNOSIS — C50212 Malignant neoplasm of upper-inner quadrant of left female breast: Secondary | ICD-10-CM

## 2019-08-09 DIAGNOSIS — Z170 Estrogen receptor positive status [ER+]: Secondary | ICD-10-CM | POA: Diagnosis not present

## 2019-08-09 LAB — CBC WITH DIFFERENTIAL (CANCER CENTER ONLY)
Abs Immature Granulocytes: 0.06 10*3/uL (ref 0.00–0.07)
Basophils Absolute: 0 10*3/uL (ref 0.0–0.1)
Basophils Relative: 1 %
Eosinophils Absolute: 0.1 10*3/uL (ref 0.0–0.5)
Eosinophils Relative: 1 %
HCT: 33.1 % — ABNORMAL LOW (ref 36.0–46.0)
Hemoglobin: 10.4 g/dL — ABNORMAL LOW (ref 12.0–15.0)
Immature Granulocytes: 1 %
Lymphocytes Relative: 23 %
Lymphs Abs: 1.2 10*3/uL (ref 0.7–4.0)
MCH: 29.4 pg (ref 26.0–34.0)
MCHC: 31.4 g/dL (ref 30.0–36.0)
MCV: 93.5 fL (ref 80.0–100.0)
Monocytes Absolute: 0.5 10*3/uL (ref 0.1–1.0)
Monocytes Relative: 10 %
Neutro Abs: 3.2 10*3/uL (ref 1.7–7.7)
Neutrophils Relative %: 64 %
Platelet Count: 339 10*3/uL (ref 150–400)
RBC: 3.54 MIL/uL — ABNORMAL LOW (ref 3.87–5.11)
RDW: 17.6 % — ABNORMAL HIGH (ref 11.5–15.5)
WBC Count: 5.1 10*3/uL (ref 4.0–10.5)
nRBC: 0 % (ref 0.0–0.2)

## 2019-08-09 LAB — CMP (CANCER CENTER ONLY)
ALT: 21 U/L (ref 0–44)
AST: 23 U/L (ref 15–41)
Albumin: 4 g/dL (ref 3.5–5.0)
Alkaline Phosphatase: 72 U/L (ref 38–126)
Anion gap: 7 (ref 5–15)
BUN: 8 mg/dL (ref 6–20)
CO2: 26 mmol/L (ref 22–32)
Calcium: 9.1 mg/dL (ref 8.9–10.3)
Chloride: 106 mmol/L (ref 98–111)
Creatinine: 0.74 mg/dL (ref 0.44–1.00)
GFR, Est AFR Am: >60 mL/min (ref >60)
GFR, Estimated: >60 mL/min (ref >60)
Glucose, Bld: 107 mg/dL — ABNORMAL HIGH (ref 70–99)
Potassium: 4.1 mmol/L (ref 3.5–5.1)
Sodium: 139 mmol/L (ref 135–145)
Total Bilirubin: 0.3 mg/dL (ref 0.3–1.2)
Total Protein: 7 g/dL (ref 6.5–8.1)

## 2019-08-09 MED ORDER — HEPARIN SOD (PORK) LOCK FLUSH 100 UNIT/ML IV SOLN
500.0000 [IU] | Freq: Once | INTRAVENOUS | Status: AC | PRN
  Administered 2019-08-09: 500 [IU]
  Filled 2019-08-09: qty 5

## 2019-08-09 MED ORDER — SODIUM CHLORIDE 0.9 % IV SOLN
Freq: Once | INTRAVENOUS | Status: AC
  Filled 2019-08-09: qty 250

## 2019-08-09 MED ORDER — DIPHENHYDRAMINE HCL 50 MG/ML IJ SOLN
INTRAMUSCULAR | Status: AC
  Filled 2019-08-09: qty 1

## 2019-08-09 MED ORDER — SODIUM CHLORIDE 0.9% FLUSH
10.0000 mL | Freq: Once | INTRAVENOUS | Status: AC
  Administered 2019-08-09: 10 mL
  Filled 2019-08-09: qty 10

## 2019-08-09 MED ORDER — SODIUM CHLORIDE 0.9% FLUSH
10.0000 mL | INTRAVENOUS | Status: DC | PRN
  Administered 2019-08-09: 10 mL
  Filled 2019-08-09: qty 10

## 2019-08-09 MED ORDER — FAMOTIDINE IN NACL 20-0.9 MG/50ML-% IV SOLN
INTRAVENOUS | Status: AC
  Filled 2019-08-09: qty 50

## 2019-08-09 MED ORDER — DIPHENHYDRAMINE HCL 50 MG/ML IJ SOLN
50.0000 mg | Freq: Once | INTRAMUSCULAR | Status: AC
  Administered 2019-08-09: 50 mg via INTRAVENOUS

## 2019-08-09 MED ORDER — SODIUM CHLORIDE 0.9 % IV SOLN
80.0000 mg/m2 | Freq: Once | INTRAVENOUS | Status: AC
  Administered 2019-08-09: 126 mg via INTRAVENOUS
  Filled 2019-08-09: qty 21

## 2019-08-09 MED ORDER — FAMOTIDINE IN NACL 20-0.9 MG/50ML-% IV SOLN
20.0000 mg | Freq: Once | INTRAVENOUS | Status: AC
  Administered 2019-08-09: 20 mg via INTRAVENOUS

## 2019-08-09 MED ORDER — SODIUM CHLORIDE 0.9 % IV SOLN
20.0000 mg | Freq: Once | INTRAVENOUS | Status: AC
  Administered 2019-08-09: 20 mg via INTRAVENOUS
  Filled 2019-08-09: qty 20

## 2019-08-09 NOTE — Assessment & Plan Note (Signed)
03/14/2019:Left lumpectomy [name]): IDC, 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Adjuvant chemotherapy with dose dense Adriamycin and Cytoxan x4 followed by Taxol x12 2.Followed by adjuvant radiation 3.Followed by adjuvant antiestrogen therapy No adverse effects due to participation in the upbeat clinical trial ----------------------------------------------------------- Current treatment:Completed 4 cycles ofdose dense Adriamycin and Cytoxan, today is cycle8Taxol Echocardiogram 04/15/2019: EF 50 to 55%.  Chemo toxicities: Chemotherapy-induced anemia: Monitoring her hemoglobin. Today's hemoglobin is 9.8. Monitoring closely for neuropathy. Extremely mild comes and goes of the tips of the fingers Mild nausea  Enrolled inSWOG 1714to prospectively evaluate for peripheral neuropathy. Return to clinicweekly for Taxol.We will see her every other week

## 2019-08-09 NOTE — Patient Instructions (Signed)
[facility] Discharge Instructions for Patients Receiving Chemotherapy  Today you received the following chemotherapy agent: Paclitaxel (Taxol)  To help prevent nausea and vomiting after your treatment, we encourage you to take your nausea medication as directed by your MD.   If you develop nausea and vomiting that is not controlled by your nausea medication, call the clinic.   BELOW ARE SYMPTOMS THAT SHOULD BE REPORTED IMMEDIATELY:  *FEVER GREATER THAN 100.5 F  *CHILLS WITH OR WITHOUT FEVER  NAUSEA AND VOMITING THAT IS NOT CONTROLLED WITH YOUR NAUSEA MEDICATION  *UNUSUAL SHORTNESS OF BREATH  *UNUSUAL BRUISING OR BLEEDING  TENDERNESS IN MOUTH AND THROAT WITH OR WITHOUT PRESENCE OF ULCERS  *URINARY PROBLEMS  *BOWEL PROBLEMS  UNUSUAL RASH Items with * indicate a potential emergency and should be followed up as soon as possible.  Feel free to call the clinic should you have any questions or concerns. The clinic phone number is [phone].  Please show the [place] at check-in to the Emergency Department and triage nurse. Coronavirus (COVID-19) Are you at risk?  Are you at risk for the Coronavirus (COVID-19)?  To be considered HIGH RISK for Coronavirus (COVID-19), you have to meet the following criteria:  . Traveled to [place], [place], [place], [place] or [place]; or in the [place] to [place], [place], [place], or [place]; and have fever, cough, and shortness of breath within the last 2 weeks of travel OR . Been in close contact with a person diagnosed with COVID-19 within the last 2 weeks and have fever, cough, and shortness of breath . IF YOU DO NOT MEET THESE CRITERIA, YOU ARE CONSIDERED LOW RISK FOR COVID-19.  What to do if you are HIGH RISK for COVID-19?  Marland Kitchen If you are having a medical emergency, call 911. . Seek medical care right away. Before you go to a doctor's office, urgent care or emergency department, call ahead and tell  them about your recent travel, contact with someone diagnosed with COVID-19, and your symptoms. You should receive instructions from your physician's office regarding next steps of care.  . When you arrive at healthcare provider, tell the healthcare staff immediately you have returned from visiting [place], [place], [place], [place] or [place]; or traveled in the [place] to [place], [place], [place], or [place]; in the last two weeks or you have been in close contact with a person diagnosed with COVID-19 in the last 2 weeks.   . Tell the health care staff about your symptoms: fever, cough and shortness of breath. . After you have been seen by a medical provider, you will be either: o Tested for (COVID-19) and discharged home on quarantine except to seek medical care if symptoms worsen, and asked to  - Stay home and avoid contact with others until you get your results (4-5 days)  - Avoid travel on public transportation if possible (such as bus, train, or airplane) or o Sent to the Emergency Department by EMS for evaluation, COVID-19 testing, and possible admission depending on your condition and test results.  What to do if you are LOW RISK for COVID-19?  Reduce your risk of any infection by using the same precautions used for avoiding the common cold or flu:  Marland Kitchen Wash your hands often with soap and warm water for at least 20 seconds.  If soap and water are not readily available, use an alcohol-based hand sanitizer with at least 60% alcohol.  . If  coughing or sneezing, cover your mouth and nose by coughing or sneezing into the elbow areas of your shirt or coat, into a tissue or into your sleeve (not your hands). . Avoid shaking hands with others and consider head nods or verbal greetings only. . Avoid touching your eyes, nose, or mouth with unwashed hands.  . Avoid close contact with people who are sick. . Avoid places or events with large numbers of people in one location, like concerts or  sporting events. . Carefully consider travel plans you have or are making. . If you are planning any travel outside or inside the [place], visit the CDC's Travelers' Health webpage for the latest health notices. . If you have some symptoms but not all symptoms, continue to monitor at home and seek medical attention if your symptoms worsen. . If you are having a medical emergency, call 911.   [place] / e-Visit: [email or web address]         [facility]: [facility]: [phone]                   [facility]: [phone]

## 2019-08-09 NOTE — Patient Instructions (Signed)

## 2019-08-16 ENCOUNTER — Inpatient Hospital Stay: Payer: Medicaid Other

## 2019-08-16 ENCOUNTER — Other Ambulatory Visit: Payer: Self-pay

## 2019-08-16 VITALS — BP 140/76 | HR 60 | Temp 98.2°F | Resp 16

## 2019-08-16 DIAGNOSIS — C50212 Malignant neoplasm of upper-inner quadrant of left female breast: Secondary | ICD-10-CM

## 2019-08-16 DIAGNOSIS — Z170 Estrogen receptor positive status [ER+]: Secondary | ICD-10-CM

## 2019-08-16 DIAGNOSIS — Z95828 Presence of other vascular implants and grafts: Secondary | ICD-10-CM

## 2019-08-16 DIAGNOSIS — Z5111 Encounter for antineoplastic chemotherapy: Secondary | ICD-10-CM | POA: Diagnosis not present

## 2019-08-16 LAB — CBC WITH DIFFERENTIAL (CANCER CENTER ONLY)
Abs Immature Granulocytes: 0.05 10*3/uL (ref 0.00–0.07)
Basophils Absolute: 0 10*3/uL (ref 0.0–0.1)
Basophils Relative: 1 %
Eosinophils Absolute: 0.1 10*3/uL (ref 0.0–0.5)
Eosinophils Relative: 1 %
HCT: 32.6 % — ABNORMAL LOW (ref 36.0–46.0)
Hemoglobin: 10.3 g/dL — ABNORMAL LOW (ref 12.0–15.0)
Immature Granulocytes: 1 %
Lymphocytes Relative: 28 %
Lymphs Abs: 1.4 10*3/uL (ref 0.7–4.0)
MCH: 29.9 pg (ref 26.0–34.0)
MCHC: 31.6 g/dL (ref 30.0–36.0)
MCV: 94.8 fL (ref 80.0–100.0)
Monocytes Absolute: 0.6 10*3/uL (ref 0.1–1.0)
Monocytes Relative: 11 %
Neutro Abs: 2.9 10*3/uL (ref 1.7–7.7)
Neutrophils Relative %: 58 %
Platelet Count: 338 10*3/uL (ref 150–400)
RBC: 3.44 MIL/uL — ABNORMAL LOW (ref 3.87–5.11)
RDW: 16.7 % — ABNORMAL HIGH (ref 11.5–15.5)
WBC Count: 5 10*3/uL (ref 4.0–10.5)
nRBC: 0 % (ref 0.0–0.2)

## 2019-08-16 LAB — CMP (CANCER CENTER ONLY)
ALT: 17 U/L (ref 0–44)
AST: 18 U/L (ref 15–41)
Albumin: 4 g/dL (ref 3.5–5.0)
Alkaline Phosphatase: 72 U/L (ref 38–126)
Anion gap: 12 (ref 5–15)
BUN: 11 mg/dL (ref 6–20)
CO2: 21 mmol/L — ABNORMAL LOW (ref 22–32)
Calcium: 8.8 mg/dL — ABNORMAL LOW (ref 8.9–10.3)
Chloride: 107 mmol/L (ref 98–111)
Creatinine: 0.78 mg/dL (ref 0.44–1.00)
GFR, Est AFR Am: >60 mL/min (ref >60)
GFR, Estimated: >60 mL/min (ref >60)
Glucose, Bld: 120 mg/dL — ABNORMAL HIGH (ref 70–99)
Potassium: 4.2 mmol/L (ref 3.5–5.1)
Sodium: 140 mmol/L (ref 135–145)
Total Bilirubin: <0.2 mg/dL — ABNORMAL LOW (ref 0.3–1.2)
Total Protein: 6.9 g/dL (ref 6.5–8.1)

## 2019-08-16 MED ORDER — DEXAMETHASONE SODIUM PHOSPHATE 100 MG/10ML IJ SOLN
20.0000 mg | Freq: Once | INTRAMUSCULAR | Status: AC
Start: 2019-08-16 — End: 2019-08-16
  Administered 2019-08-16: 20 mg via INTRAVENOUS
  Filled 2019-08-16: qty 20

## 2019-08-16 MED ORDER — SODIUM CHLORIDE 0.9% FLUSH
10.0000 mL | Freq: Once | INTRAVENOUS | Status: AC
  Administered 2019-08-16: 10 mL
  Filled 2019-08-16: qty 10

## 2019-08-16 MED ORDER — FAMOTIDINE IN NACL 20-0.9 MG/50ML-% IV SOLN
20.0000 mg | Freq: Once | INTRAVENOUS | Status: AC
  Administered 2019-08-16: 20 mg via INTRAVENOUS

## 2019-08-16 MED ORDER — HEPARIN SOD (PORK) LOCK FLUSH 100 UNIT/ML IV SOLN
500.0000 [IU] | Freq: Once | INTRAVENOUS | Status: AC | PRN
  Administered 2019-08-16: 500 [IU]
  Filled 2019-08-16: qty 5

## 2019-08-16 MED ORDER — DIPHENHYDRAMINE HCL 50 MG/ML IJ SOLN
50.0000 mg | Freq: Once | INTRAMUSCULAR | Status: AC
  Administered 2019-08-16: 50 mg via INTRAVENOUS

## 2019-08-16 MED ORDER — SODIUM CHLORIDE 0.9 % IV SOLN
Freq: Once | INTRAVENOUS | Status: AC
Start: 2019-08-16 — End: 2019-08-16
  Filled 2019-08-16: qty 250

## 2019-08-16 MED ORDER — FAMOTIDINE IN NACL 20-0.9 MG/50ML-% IV SOLN
INTRAVENOUS | Status: AC
  Filled 2019-08-16: qty 50

## 2019-08-16 MED ORDER — SODIUM CHLORIDE 0.9 % IV SOLN
80.0000 mg/m2 | Freq: Once | INTRAVENOUS | Status: AC
  Administered 2019-08-16: 126 mg via INTRAVENOUS
  Filled 2019-08-16: qty 21

## 2019-08-16 MED ORDER — SODIUM CHLORIDE 0.9% FLUSH
10.0000 mL | INTRAVENOUS | Status: DC | PRN
  Administered 2019-08-16: 10 mL
  Filled 2019-08-16: qty 10

## 2019-08-16 MED ORDER — DIPHENHYDRAMINE HCL 50 MG/ML IJ SOLN
INTRAMUSCULAR | Status: AC
  Filled 2019-08-16: qty 1

## 2019-08-16 NOTE — Patient Instructions (Signed)
[facility] Discharge Instructions for Patients Receiving Chemotherapy  Today you received the following chemotherapy agents: paclitaxel.  To help prevent nausea and vomiting after your treatment, we encourage you to take your nausea medication as directed.   If you develop nausea and vomiting that is not controlled by your nausea medication, call the clinic.   BELOW ARE SYMPTOMS THAT SHOULD BE REPORTED IMMEDIATELY:  *FEVER GREATER THAN 100.5 F  *CHILLS WITH OR WITHOUT FEVER  NAUSEA AND VOMITING THAT IS NOT CONTROLLED WITH YOUR NAUSEA MEDICATION  *UNUSUAL SHORTNESS OF BREATH  *UNUSUAL BRUISING OR BLEEDING  TENDERNESS IN MOUTH AND THROAT WITH OR WITHOUT PRESENCE OF ULCERS  *URINARY PROBLEMS  *BOWEL PROBLEMS  UNUSUAL RASH Items with * indicate a potential emergency and should be followed up as soon as possible.  Feel free to call the clinic should you have any questions or concerns. The clinic phone number is [phone].  Please show the CHEMO ALERT CARD at check-in to the Emergency Department and triage nurse.   

## 2019-08-22 NOTE — Progress Notes (Signed)
Patient Care Team: Tawny Asal as PCP - General (Physician Assistant) Mauro Kaufmann, RN as Oncology Nurse Navigator Rockwell Germany, RN as Oncology Nurse Navigator Alphonsa Overall, MD as Consulting Physician (General Surgery) Nicholas Lose, MD as Consulting Physician (Hematology and Oncology) Gery Pray, MD as Consulting Physician (Radiation Oncology)  DIAGNOSIS:    ICD-10-CM   1. Malignant neoplasm of upper-inner quadrant of left breast in female, estrogen receptor positive ([place])  C50.212    Z17.0     SUMMARY OF ONCOLOGIC HISTORY: Oncology History  Malignant neoplasm of upper-inner quadrant of left breast in female, estrogen receptor positive ([place])  02/07/2019 Initial Diagnosis   Diagnostic mammogram following palpable lump in the left breast x6 months showed 3.0cm mass at the 9 oc'clock position 2cm from the nipple. Biopsy confirmed grade 3 IDC, HER2 negative by FISH, ER+ (100%), PR+ (80%), Ki67 20%.    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    03/29/2019 Cancer Staging   Staging form: Breast, AJCC 8th Edition - Pathologic stage from 03/29/2019: Stage IIA (pT2, pN1a, cM0, G3, ER+, PR+, HER2-) - Signed by Nicholas Lose, MD on 03/29/2019   04/26/2019 -  Chemotherapy   Adj chemo with AC-T     CHIEF COMPLIANT: Cycle10Taxol  INTERVAL HISTORY: Linda Spears is a 59 y.o. with above-mentioned history of breast cancer who underwent a lumpectomy and is currently on adjuvant chemotherapy treatment. She completed 4 cycles of dosedense Adriamycin and Cytoxanand is currently receiving weekly Taxol treatments.She is a participant in the  neuropathy clinical trial.She presents to the clinic todayforcycle10and a toxicity check.  REVIEW OF SYSTEMS:   Constitutional: Denies fevers, chills or abnormal weight loss Eyes: Denies blurriness of vision Ears, nose, mouth, throat, and face: Denies mucositis or sore throat Respiratory: Denies cough, dyspnea or wheezes Cardiovascular: Denies palpitation, chest discomfort Gastrointestinal: Denies nausea, heartburn or change in bowel habits Skin: Denies abnormal skin rashes Lymphatics: Denies new lymphadenopathy or easy bruising Neurological: Denies numbness, tingling or new weaknesses Behavioral/Psych: Mood is stable, no new changes  Extremities: No lower extremity edema Breast: denies any pain or lumps or nodules in either breasts All other systems were reviewed with the patient and are negative.  I have reviewed the past medical history, past surgical history, social history and family history with the patient and they are unchanged from previous note.  ALLERGIES:  has No Known Allergies.  MEDICATIONS:  Current Outpatient Medications  Medication Sig Dispense Refill  . acetaminophen (TYLENOL) 500 MG tablet Take 1,000 mg by mouth every 6 (six) hours as needed for moderate pain.    Marland Kitchen lidocaine-prilocaine (EMLA) cream Apply to affected area once 30 g 3   No current facility-administered medications for this visit.   Facility-Administered Medications Ordered in Other Visits  Medication Dose Route Frequency Provider Last Rate Last Admin  . pegfilgrastim (NEULASTA) injection 6 mg  6 mg Subcutaneous Once Nicholas Lose, MD        PHYSICAL EXAMINATION: ECOG PERFORMANCE STATUS: 1 - Symptomatic but completely ambulatory  Vitals:   08/23/19 0949  BP: 134/85  Pulse: 65  Resp: 18  Temp: 97.8 F (36.6 C)  SpO2:  100%   Filed Weights   08/23/19 0949  Weight: 124 lb 12.8 oz (56.6 kg)    GENERAL: alert, no distress and comfortable SKIN: skin color, texture, turgor are normal,  no rashes or significant lesions EYES: normal, Conjunctiva are pink and non-injected, sclera clear OROPHARYNX: no exudate, no erythema and lips, buccal mucosa, and tongue normal  NECK: supple, thyroid normal size, non-tender, without nodularity LYMPH: no palpable lymphadenopathy in the cervical, axillary or inguinal LUNGS: clear to auscultation and percussion with normal breathing effort HEART: regular rate & rhythm and no murmurs and no lower extremity edema ABDOMEN: abdomen soft, non-tender and normal bowel sounds MUSCULOSKELETAL: no cyanosis of digits and no clubbing  NEURO: alert & oriented x 3 with fluent speech, no focal motor/sensory deficits EXTREMITIES: No lower extremity edema  LABORATORY DATA:  I have reviewed the data as listed CMP Latest Ref Rng & Units 08/16/2019 08/09/2019 08/02/2019  Glucose 70 - 99 mg/dL 120(H) 107(H) 99  BUN 6 - 20 mg/dL _0 Creatinine 0.44 - 1.00 mg/dL 0.78 0.74 0.77  Sodium 135 - 145 mmol/L 140 139 140  Potassium 3.5 - 5.1 mmol/L 4.2 4.1 4.3  Chloride 98 - 111 mmol/L 107 106 107  CO2 22 - 32 mmol/L 21(L) 26 26  Calcium 8.9 - 10.3 mg/dL 8.8(L) 9.1 9.0  Total Protein 6.5 - 8.1 g/dL 6.9 7.0 7.0  Total Bilirubin 0.3 - 1.2 mg/dL <0.2(L) 0.3 0.3  Alkaline Phos 38 - 126 U/L 72 72 75  AST 15 - 41 U/L _1 ALT 0 - 44 U/L _2 Lab Results  Component Value Date   WBC 6.1 08/23/2019   HGB 10.3 (L) 08/23/2019   HCT 32.7 (L) 08/23/2019   MCV 95.1 08/23/2019   PLT 321 08/23/2019   NEUTROABS 3.7 08/23/2019    ASSESSMENT & PLAN:  Malignant neoplasm of upper-inner quadrant of left breast in female, estrogen receptor positive ([place]) 03/14/2019:Left lumpectomy [name]): IDC, 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Adjuvant  chemotherapy with dose dense Adriamycin and Cytoxan x4 followed by Taxol x12 2.Followed by adjuvant radiation 3.Followed by adjuvant antiestrogen therapy No adverse effects due to participation in the upbeat clinical trial ----------------------------------------------------------- Current treatment:Completed 4 cycles ofdose dense Adriamycin and Cytoxan, today is cycle10Taxol Echocardiogram 04/15/2019: EF 50 to 55%.  Chemo toxicities: Chemotherapy-induced anemia: Monitoring her hemoglobin. 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   Enrolled inSWOG 1714to prospectively evaluate for peripheral neuropathy. After today patient has only 2 more doses of Taxol.  This will conclude the adjuvant chemotherapy phase.  She will then receive adjuvant radiation. I sent a referral for radiation oncology consultation   No orders of the defined types were placed in this encounter.  The patient has a good understanding of the overall plan. she agrees with it. she will call with any problems that may develop before the next visit here.  Nicholas Lose, MD 08/23/2019  Julious Oka Dorshimer, am acting as scribe for Dr. Nicholas Lose.  I have reviewed the above document for accuracy and completeness, and I agree with the above.

## 2019-08-23 ENCOUNTER — Other Ambulatory Visit: Payer: Self-pay | Admitting: *Deleted

## 2019-08-23 ENCOUNTER — Inpatient Hospital Stay: Payer: Medicaid Other

## 2019-08-23 ENCOUNTER — Inpatient Hospital Stay: Payer: Medicaid Other | Admitting: *Deleted

## 2019-08-23 ENCOUNTER — Other Ambulatory Visit: Payer: Self-pay

## 2019-08-23 ENCOUNTER — Inpatient Hospital Stay (HOSPITAL_BASED_OUTPATIENT_CLINIC_OR_DEPARTMENT_OTHER): Payer: Medicaid Other | Admitting: Hematology and Oncology

## 2019-08-23 DIAGNOSIS — Z170 Estrogen receptor positive status [ER+]: Secondary | ICD-10-CM | POA: Diagnosis not present

## 2019-08-23 DIAGNOSIS — C50212 Malignant neoplasm of upper-inner quadrant of left female breast: Secondary | ICD-10-CM

## 2019-08-23 DIAGNOSIS — Z006 Encounter for examination for normal comparison and control in clinical research program: Secondary | ICD-10-CM

## 2019-08-23 DIAGNOSIS — G620 Drug-induced polyneuropathy: Secondary | ICD-10-CM

## 2019-08-23 DIAGNOSIS — D6481 Anemia due to antineoplastic chemotherapy: Secondary | ICD-10-CM | POA: Diagnosis not present

## 2019-08-23 DIAGNOSIS — Z95828 Presence of other vascular implants and grafts: Secondary | ICD-10-CM

## 2019-08-23 DIAGNOSIS — Z5111 Encounter for antineoplastic chemotherapy: Secondary | ICD-10-CM | POA: Diagnosis not present

## 2019-08-23 LAB — CMP (CANCER CENTER ONLY)
ALT: 17 U/L (ref 0–44)
AST: 18 U/L (ref 15–41)
Albumin: 4 g/dL (ref 3.5–5.0)
Alkaline Phosphatase: 67 U/L (ref 38–126)
Anion gap: 8 (ref 5–15)
BUN: 8 mg/dL (ref 6–20)
CO2: 24 mmol/L (ref 22–32)
Calcium: 8.6 mg/dL — ABNORMAL LOW (ref 8.9–10.3)
Chloride: 107 mmol/L (ref 98–111)
Creatinine: 0.74 mg/dL (ref 0.44–1.00)
GFR, Est AFR Am: >60 mL/min (ref >60)
GFR, Estimated: >60 mL/min (ref >60)
Glucose, Bld: 104 mg/dL — ABNORMAL HIGH (ref 70–99)
Potassium: 4.4 mmol/L (ref 3.5–5.1)
Sodium: 139 mmol/L (ref 135–145)
Total Bilirubin: <0.2 mg/dL — ABNORMAL LOW (ref 0.3–1.2)
Total Protein: 6.8 g/dL (ref 6.5–8.1)

## 2019-08-23 LAB — CBC WITH DIFFERENTIAL (CANCER CENTER ONLY)
Abs Immature Granulocytes: 0.1 10*3/uL — ABNORMAL HIGH (ref 0.00–0.07)
Basophils Absolute: 0 10*3/uL (ref 0.0–0.1)
Basophils Relative: 1 %
Eosinophils Absolute: 0.1 10*3/uL (ref 0.0–0.5)
Eosinophils Relative: 1 %
HCT: 32.7 % — ABNORMAL LOW (ref 36.0–46.0)
Hemoglobin: 10.3 g/dL — ABNORMAL LOW (ref 12.0–15.0)
Immature Granulocytes: 2 %
Lymphocytes Relative: 25 %
Lymphs Abs: 1.5 10*3/uL (ref 0.7–4.0)
MCH: 29.9 pg (ref 26.0–34.0)
MCHC: 31.5 g/dL (ref 30.0–36.0)
MCV: 95.1 fL (ref 80.0–100.0)
Monocytes Absolute: 0.7 10*3/uL (ref 0.1–1.0)
Monocytes Relative: 11 %
Neutro Abs: 3.7 10*3/uL (ref 1.7–7.7)
Neutrophils Relative %: 60 %
Platelet Count: 321 10*3/uL (ref 150–400)
RBC: 3.44 MIL/uL — ABNORMAL LOW (ref 3.87–5.11)
RDW: 16.3 % — ABNORMAL HIGH (ref 11.5–15.5)
WBC Count: 6.1 10*3/uL (ref 4.0–10.5)
nRBC: 0 % (ref 0.0–0.2)

## 2019-08-23 MED ORDER — SODIUM CHLORIDE 0.9 % IV SOLN
20.0000 mg | Freq: Once | INTRAVENOUS | Status: AC
  Administered 2019-08-23: 20 mg via INTRAVENOUS
  Filled 2019-08-23: qty 2

## 2019-08-23 MED ORDER — DIPHENHYDRAMINE HCL 50 MG/ML IJ SOLN
INTRAMUSCULAR | Status: AC
  Filled 2019-08-23: qty 1

## 2019-08-23 MED ORDER — SODIUM CHLORIDE 0.9 % IV SOLN
Freq: Once | INTRAVENOUS | Status: AC
  Filled 2019-08-23: qty 250

## 2019-08-23 MED ORDER — HEPARIN SOD (PORK) LOCK FLUSH 100 UNIT/ML IV SOLN
500.0000 [IU] | Freq: Once | INTRAVENOUS | Status: AC | PRN
  Administered 2019-08-23: 500 [IU]
  Filled 2019-08-23: qty 5

## 2019-08-23 MED ORDER — SODIUM CHLORIDE 0.9 % IV SOLN
80.0000 mg/m2 | Freq: Once | INTRAVENOUS | Status: AC
  Administered 2019-08-23: 126 mg via INTRAVENOUS
  Filled 2019-08-23: qty 21

## 2019-08-23 MED ORDER — SODIUM CHLORIDE 0.9% FLUSH
10.0000 mL | INTRAVENOUS | Status: DC | PRN
  Administered 2019-08-23: 10 mL
  Filled 2019-08-23: qty 10

## 2019-08-23 MED ORDER — DIPHENHYDRAMINE HCL 50 MG/ML IJ SOLN
50.0000 mg | Freq: Once | INTRAMUSCULAR | Status: AC
  Administered 2019-08-23: 50 mg via INTRAVENOUS

## 2019-08-23 MED ORDER — FAMOTIDINE IN NACL 20-0.9 MG/50ML-% IV SOLN
INTRAVENOUS | Status: AC
  Filled 2019-08-23: qty 50

## 2019-08-23 MED ORDER — FAMOTIDINE IN NACL 20-0.9 MG/50ML-% IV SOLN
20.0000 mg | Freq: Once | INTRAVENOUS | Status: AC
  Administered 2019-08-23: 20 mg via INTRAVENOUS

## 2019-08-23 MED ORDER — SODIUM CHLORIDE 0.9% FLUSH
10.0000 mL | Freq: Once | INTRAVENOUS | Status: AC
  Administered 2019-08-23: 10 mL
  Filled 2019-08-23: qty 10

## 2019-08-23 NOTE — Research (Signed)
[id number], A PROSPECTIVE OBSERVATIONAL COHORT STUDY TO DEVELOP A PREDICTIVE MODEL OF TAXANE-INDUCED PERIPHERAL NEUROPATHY IN CANCER PATIENTS. 8 WEEKS VISIT: PROs; Questionnaires were given to patient to complete in clinic prior to lab appointment. Collected questionnaires and checked for completeness and accuracy.  History of Falls;  Not required this visit.  Solicited Neuropathy Events; Patient reports continued feeling of numbness and tingling in fingertips. It does not interfere with any functioning or ADLs. It is unchanged since last visit.  She states has not noticed any numbness in her feet this past month Grade 1 Paresthesia reported on CRF and Dr. Lindi Adie agrees and states this is related to Taxol.  Treatment; Pacitaxel cycle 10 today without any dose adjustments.  Assessment for Interventions for CIPN; Reviewed with patient and CRFs completed. Patient is icing her hands and feet during Taxol infusion to help prevent neuropathy. She has also started using tea tree oil once a day to her hands and fingertips to help the neuropathy symptoms.  Neuropen Assessment; Completed per protocol by this research RN with assistance timing and recording by Otilio Miu, Electrical engineer.  [place] Assessment; Completed per protocol by this research RN with assistance timing and recording by Otilio Miu, Electrical engineer.  Timed Get Up and Go Test; Not required this visit.   Physician Assessments; Treatment [place] form completed and signed by Dr. Lindi Adie today based on his assessment of seeing patient in clinic today. Plan; Informed patient of next study assessments planned for week 12 on 09/06/19 and will be done on same day as her last treatment. Patient verbalized understanding.  Thanked patient for participating in this study today. Encouraged her to call clinic if any questions or concerns prior to next appointment. She verbalized understanding. Foye Spurling, BSN, RN Clinical Research Nurse 08/23/2019 10:51  AM

## 2019-08-23 NOTE — Assessment & Plan Note (Signed)
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Adjuvant chemotherapy with dose dense Adriamycin and Cytoxan x4 followed by Taxol x12 2.Followed by adjuvant radiation 3.Followed by adjuvant antiestrogen therapy No adverse effects due to participation in the upbeat clinical trial ----------------------------------------------------------- Current treatment:Completed 4 cycles ofdose dense Adriamycin and Cytoxan, today is cycle10Taxol Echocardiogram 04/15/2019: EF 50 to 55%.  Chemo toxicities: Chemotherapy-induced anemia: Monitoring her hemoglobin. Today's hemoglobin is9.8. Monitoring closely for neuropathy.Extremely mild comes and goes of the tips of the fingers especially in the left hand.  It is intermittent. Mild nausea  Enrolled inSWOG 1714to prospectively evaluate for peripheral neuropathy. After today patient has only 2 more doses of Taxol.  This will conclude the adjuvant chemotherapy phase.  She will then receive adjuvant radiation.

## 2019-08-23 NOTE — Patient Instructions (Signed)
[facility] Discharge Instructions for Patients Receiving Chemotherapy  Today you received the following chemotherapy agents:  Taxol.  To help prevent nausea and vomiting after your treatment, we encourage you to take your nausea medication as directed.   If you develop nausea and vomiting that is not controlled by your nausea medication, call the clinic.   BELOW ARE SYMPTOMS THAT SHOULD BE REPORTED IMMEDIATELY:  *FEVER GREATER THAN 100.5 F  *CHILLS WITH OR WITHOUT FEVER  NAUSEA AND VOMITING THAT IS NOT CONTROLLED WITH YOUR NAUSEA MEDICATION  *UNUSUAL SHORTNESS OF BREATH  *UNUSUAL BRUISING OR BLEEDING  TENDERNESS IN MOUTH AND THROAT WITH OR WITHOUT PRESENCE OF ULCERS  *URINARY PROBLEMS  *BOWEL PROBLEMS  UNUSUAL RASH Items with * indicate a potential emergency and should be followed up as soon as possible.  Feel free to call the clinic should you have any questions or concerns. The clinic phone number is [phone].  Please show the CHEMO ALERT CARD at check-in to the Emergency Department and triage nurse.   

## 2019-08-24 ENCOUNTER — Telehealth: Payer: Self-pay | Admitting: Hematology and Oncology

## 2019-08-24 NOTE — Telephone Encounter (Signed)
No new los during check out. 

## 2019-08-30 ENCOUNTER — Inpatient Hospital Stay: Payer: Medicaid Other

## 2019-08-30 ENCOUNTER — Other Ambulatory Visit: Payer: Self-pay

## 2019-08-30 ENCOUNTER — Inpatient Hospital Stay: Payer: Medicaid Other | Attending: Hematology and Oncology

## 2019-08-30 ENCOUNTER — Encounter: Payer: Self-pay | Admitting: *Deleted

## 2019-08-30 VITALS — BP 124/79 | HR 62 | Resp 17 | Ht 62.0 in | Wt 125.5 lb

## 2019-08-30 DIAGNOSIS — T451X5A Adverse effect of antineoplastic and immunosuppressive drugs, initial encounter: Secondary | ICD-10-CM | POA: Diagnosis not present

## 2019-08-30 DIAGNOSIS — Z5111 Encounter for antineoplastic chemotherapy: Secondary | ICD-10-CM | POA: Diagnosis not present

## 2019-08-30 DIAGNOSIS — G620 Drug-induced polyneuropathy: Secondary | ICD-10-CM | POA: Diagnosis not present

## 2019-08-30 DIAGNOSIS — D6481 Anemia due to antineoplastic chemotherapy: Secondary | ICD-10-CM | POA: Insufficient documentation

## 2019-08-30 DIAGNOSIS — C50212 Malignant neoplasm of upper-inner quadrant of left female breast: Secondary | ICD-10-CM

## 2019-08-30 DIAGNOSIS — Z95828 Presence of other vascular implants and grafts: Secondary | ICD-10-CM

## 2019-08-30 DIAGNOSIS — Z170 Estrogen receptor positive status [ER+]: Secondary | ICD-10-CM | POA: Insufficient documentation

## 2019-08-30 DIAGNOSIS — Z006 Encounter for examination for normal comparison and control in clinical research program: Secondary | ICD-10-CM | POA: Diagnosis present

## 2019-08-30 DIAGNOSIS — L608 Other nail disorders: Secondary | ICD-10-CM | POA: Insufficient documentation

## 2019-08-30 LAB — CMP (CANCER CENTER ONLY)
ALT: 20 U/L (ref 0–44)
AST: 20 U/L (ref 15–41)
Albumin: 3.9 g/dL (ref 3.5–5.0)
Alkaline Phosphatase: 54 U/L (ref 38–126)
Anion gap: 7 (ref 5–15)
BUN: 10 mg/dL (ref 6–20)
CO2: 25 mmol/L (ref 22–32)
Calcium: 8.9 mg/dL (ref 8.9–10.3)
Chloride: 106 mmol/L (ref 98–111)
Creatinine: 0.65 mg/dL (ref 0.44–1.00)
GFR, Est AFR Am: >60 mL/min (ref >60)
GFR, Estimated: >60 mL/min (ref >60)
Glucose, Bld: 108 mg/dL — ABNORMAL HIGH (ref 70–99)
Potassium: 3.9 mmol/L (ref 3.5–5.1)
Sodium: 138 mmol/L (ref 135–145)
Total Bilirubin: 0.8 mg/dL (ref 0.3–1.2)
Total Protein: 6.6 g/dL (ref 6.5–8.1)

## 2019-08-30 LAB — CBC WITH DIFFERENTIAL (CANCER CENTER ONLY)
Abs Immature Granulocytes: 0.07 10*3/uL (ref 0.00–0.07)
Basophils Absolute: 0.1 10*3/uL (ref 0.0–0.1)
Basophils Relative: 1 %
Eosinophils Absolute: 0.1 10*3/uL (ref 0.0–0.5)
Eosinophils Relative: 2 %
HCT: 33.6 % — ABNORMAL LOW (ref 36.0–46.0)
Hemoglobin: 10.6 g/dL — ABNORMAL LOW (ref 12.0–15.0)
Immature Granulocytes: 1 %
Lymphocytes Relative: 22 %
Lymphs Abs: 1.3 10*3/uL (ref 0.7–4.0)
MCH: 29.6 pg (ref 26.0–34.0)
MCHC: 31.5 g/dL (ref 30.0–36.0)
MCV: 93.9 fL (ref 80.0–100.0)
Monocytes Absolute: 0.7 10*3/uL (ref 0.1–1.0)
Monocytes Relative: 11 %
Neutro Abs: 3.8 10*3/uL (ref 1.7–7.7)
Neutrophils Relative %: 63 %
Platelet Count: 324 10*3/uL (ref 150–400)
RBC: 3.58 MIL/uL — ABNORMAL LOW (ref 3.87–5.11)
RDW: 15.2 % (ref 11.5–15.5)
WBC Count: 6 10*3/uL (ref 4.0–10.5)
nRBC: 0 % (ref 0.0–0.2)

## 2019-08-30 MED ORDER — FAMOTIDINE IN NACL 20-0.9 MG/50ML-% IV SOLN
20.0000 mg | Freq: Once | INTRAVENOUS | Status: AC
  Administered 2019-08-30: 20 mg via INTRAVENOUS

## 2019-08-30 MED ORDER — SODIUM CHLORIDE 0.9% FLUSH
10.0000 mL | Freq: Once | INTRAVENOUS | Status: AC
  Administered 2019-08-30: 10 mL
  Filled 2019-08-30: qty 10

## 2019-08-30 MED ORDER — HEPARIN SOD (PORK) LOCK FLUSH 100 UNIT/ML IV SOLN
500.0000 [IU] | Freq: Once | INTRAVENOUS | Status: AC | PRN
  Administered 2019-08-30: 500 [IU]
  Filled 2019-08-30: qty 5

## 2019-08-30 MED ORDER — DIPHENHYDRAMINE HCL 50 MG/ML IJ SOLN
50.0000 mg | Freq: Once | INTRAMUSCULAR | Status: AC
  Administered 2019-08-30: 10:00:00 50 mg via INTRAVENOUS

## 2019-08-30 MED ORDER — SODIUM CHLORIDE 0.9 % IV SOLN
80.0000 mg/m2 | Freq: Once | INTRAVENOUS | Status: AC
  Administered 2019-08-30: 126 mg via INTRAVENOUS
  Filled 2019-08-30: qty 21

## 2019-08-30 MED ORDER — SODIUM CHLORIDE 0.9 % IV SOLN
Freq: Once | INTRAVENOUS | Status: AC
  Filled 2019-08-30: qty 250

## 2019-08-30 MED ORDER — DIPHENHYDRAMINE HCL 50 MG/ML IJ SOLN
INTRAMUSCULAR | Status: AC
  Filled 2019-08-30: qty 1

## 2019-08-30 MED ORDER — SODIUM CHLORIDE 0.9% FLUSH
10.0000 mL | INTRAVENOUS | Status: DC | PRN
  Administered 2019-08-30: 10 mL
  Filled 2019-08-30: qty 10

## 2019-08-30 MED ORDER — FAMOTIDINE IN NACL 20-0.9 MG/50ML-% IV SOLN
INTRAVENOUS | Status: AC
  Filled 2019-08-30: qty 50

## 2019-08-30 MED ORDER — SODIUM CHLORIDE 0.9 % IV SOLN
20.0000 mg | Freq: Once | INTRAVENOUS | Status: AC
  Administered 2019-08-30: 20 mg via INTRAVENOUS
  Filled 2019-08-30: qty 20

## 2019-08-30 NOTE — Patient Instructions (Signed)
[facility] Discharge Instructions for Patients Receiving Chemotherapy  Today you received the following chemotherapy agents: Paclitaxel (Taxol)  To help prevent nausea and vomiting after your treatment, we encourage you to take your nausea medication as directed.    If you develop nausea and vomiting that is not controlled by your nausea medication, call the clinic.   BELOW ARE SYMPTOMS THAT SHOULD BE REPORTED IMMEDIATELY:  *FEVER GREATER THAN 100.5 F  *CHILLS WITH OR WITHOUT FEVER  NAUSEA AND VOMITING THAT IS NOT CONTROLLED WITH YOUR NAUSEA MEDICATION  *UNUSUAL SHORTNESS OF BREATH  *UNUSUAL BRUISING OR BLEEDING  TENDERNESS IN MOUTH AND THROAT WITH OR WITHOUT PRESENCE OF ULCERS  *URINARY PROBLEMS  *BOWEL PROBLEMS  UNUSUAL RASH Items with * indicate a potential emergency and should be followed up as soon as possible.  Feel free to call the clinic should you have any questions or concerns. The clinic phone number is [phone].  Please show the CHEMO ALERT CARD at check-in to the Emergency Department and triage nurse.  Coronavirus (COVID-19) Are you at risk?  Are you at risk for the Coronavirus (COVID-19)?  To be considered HIGH RISK for Coronavirus (COVID-19), you have to meet the following criteria:  . Traveled to [place], [place], [place], [place] or [place]; or in the [place] to [place], [place], [place], or [place]; and have fever, cough, and shortness of breath within the last 2 weeks of travel OR . Been in close contact with a person diagnosed with COVID-19 within the last 2 weeks and have fever, cough, and shortness of breath . IF YOU DO NOT MEET THESE CRITERIA, YOU ARE CONSIDERED LOW RISK FOR COVID-19.  What to do if you are HIGH RISK for COVID-19?  . If you are having a medical emergency, call 911. . Seek medical care right away. Before you go to a doctor's office, urgent care or emergency department, call ahead and tell them about  your recent travel, contact with someone diagnosed with COVID-19, and your symptoms. You should receive instructions from your physician's office regarding next steps of care.  . When you arrive at healthcare provider, tell the healthcare staff immediately you have returned from visiting [place], [place], [place], [place] or [place]; or traveled in the [place] to [place], [place], [place], or [place]; in the last two weeks or you have been in close contact with a person diagnosed with COVID-19 in the last 2 weeks.   . Tell the health care staff about your symptoms: fever, cough and shortness of breath. . After you have been seen by a medical provider, you will be either: o Tested for (COVID-19) and discharged home on quarantine except to seek medical care if symptoms worsen, and asked to  - Stay home and avoid contact with others until you get your results (4-5 days)  - Avoid travel on public transportation if possible (such as bus, train, or airplane) or o Sent to the Emergency Department by EMS for evaluation, COVID-19 testing, and possible admission depending on your condition and test results.  What to do if you are LOW RISK for COVID-19?  Reduce your risk of any infection by using the same precautions used for avoiding the common cold or flu:  . Wash your hands often with soap and warm water for at least 20 seconds.  If soap and water are not readily available, use an alcohol-based hand sanitizer with at least 60% alcohol.  . If coughing   or sneezing, cover your mouth and nose by coughing or sneezing into the elbow areas of your shirt or coat, into a tissue or into your sleeve (not your hands). . Avoid shaking hands with others and consider head nods or verbal greetings only. . Avoid touching your eyes, nose, or mouth with unwashed hands.  . Avoid close contact with people who are sick. . Avoid places or events with large numbers of people in one location, like concerts or sporting  events. . Carefully consider travel plans you have or are making. . If you are planning any travel outside or inside the [place], visit the CDC's Travelers' Health webpage for the latest health notices. . If you have some symptoms but not all symptoms, continue to monitor at home and seek medical attention if your symptoms worsen. . If you are having a medical emergency, call 911.   ADDITIONAL HEALTHCARE OPTIONS FOR PATIENTS  [place] [facility] / e-Visit: [email or web address]         [facility]: [phone]  [facility]: [phone]                   [facility]: [phone]   

## 2019-08-30 NOTE — Patient Instructions (Signed)

## 2019-09-05 NOTE — Progress Notes (Signed)
Patient Care Team: Tawny Asal as PCP - General (Physician Assistant) Mauro Kaufmann, RN as Oncology Nurse Navigator Rockwell Germany, RN as Oncology Nurse Navigator Alphonsa Overall, MD as Consulting Physician (General Surgery) Nicholas Lose, MD as Consulting Physician (Hematology and Oncology) Gery Pray, MD as Consulting Physician (Radiation Oncology)  DIAGNOSIS:    ICD-10-CM   1. Malignant neoplasm of upper-inner quadrant of left breast in female, estrogen receptor positive ([place])  C50.212    Z17.0     SUMMARY OF ONCOLOGIC HISTORY: Oncology History  Malignant neoplasm of upper-inner quadrant of left breast in female, estrogen receptor positive ([place])  02/07/2019 Initial Diagnosis   Diagnostic mammogram following palpable lump in the left breast x6 months showed 3.0cm mass at the 9 oc'clock position 2cm from the nipple. Biopsy confirmed grade 3 IDC, HER2 negative by FISH, ER+ (100%), PR+ (80%), Ki67 20%.    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    03/29/2019 Cancer Staging   Staging form: Breast, AJCC 8th Edition - Pathologic stage from 03/29/2019: Stage IIA (pT2, pN1a, cM0, G3, ER+, PR+, HER2-) - Signed by Nicholas Lose, MD on 03/29/2019   04/26/2019 -  Chemotherapy   Adj chemo with AC-T     CHIEF COMPLIANT: Cycle12Taxol  INTERVAL HISTORY: Linda Spears is a 59 y.o. with above-mentioned history of breast cancer who underwent a lumpectomy and is currently on adjuvant chemotherapy treatment. She completed 4 cycles of dosedense Adriamycin and Cytoxanand is currently receiving weekly Taxol treatments.She is a participant in the  neuropathy clinical trial.She presents to the clinic todayforcycle12and a toxicity check. She has occasional dizziness but it is very short lasting.  Occasional  ALLERGIES:  has No Known Allergies.  MEDICATIONS:  Current Outpatient Medications  Medication Sig Dispense Refill  . acetaminophen (TYLENOL) 500 MG tablet Take 1,000 mg by mouth every 6 (six) hours as needed for moderate pain.    Marland Kitchen lidocaine-prilocaine (EMLA) cream Apply to affected area once 30 g 3   No current facility-administered medications for this visit.   Facility-Administered Medications Ordered in Other Visits  Medication Dose Route Frequency Provider Last Rate Last Admin  . pegfilgrastim (NEULASTA) injection 6 mg  6 mg Subcutaneous Once Nicholas Lose, MD        PHYSICAL EXAMINATION: ECOG PERFORMANCE STATUS: 1 - Symptomatic but completely ambulatory  Vitals:   09/06/19 0823  BP: 131/77  Pulse: 80  Resp: 20  Temp: 98 F (36.7 C)  SpO2: 100%   Filed Weights   09/06/19 0823  Weight: 125 lb 9.6 oz (57 kg)    LABORATORY DATA:  I have reviewed the data as listed CMP Latest Ref Rng & Units 08/30/2019 08/23/2019 08/16/2019  Glucose 70 - 99 mg/dL 108(H) 104(H) 120(H)  BUN 6 - 20 mg/dL 10 8 11   Creatinine 0.44 - 1.00 mg/dL 0.65 0.74 0.78  Sodium 135 - 145 mmol/L 138 139 140  Potassium 3.5 - 5.1 mmol/L 3.9 4.4 4.2  Chloride 98 - 111 mmol/L 106 107 107  CO2 22 - 32 mmol/L 25 24 21(L)  Calcium 8.9 - 10.3 mg/dL 8.9 8.6(L) 8.8(L)  Total Protein 6.5 - 8.1 g/dL  6.6 6.8 6.9  Total Bilirubin 0.3 - 1.2 mg/dL 0.8 <0.2(L) <0.2(L)  Alkaline Phos 38 - 126 U/L 54 67 72  AST 15 - 41 U/L 20 18 18   ALT 0 - 44 U/L 20 17 17     Lab Results  Component Value Date   WBC 6.0 08/30/2019   HGB 10.6 (L) 08/30/2019   HCT 33.6 (L) 08/30/2019   MCV 93.9 08/30/2019   PLT 324 08/30/2019   NEUTROABS 3.8 08/30/2019    ASSESSMENT & PLAN:  Malignant neoplasm of upper-inner quadrant of left breast in female, estrogen receptor  positive ([place]) 03/14/2019:Left lumpectomy [name]): IDC, 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Adjuvant chemotherapy with dose dense Adriamycin and Cytoxan x4 followed by Taxol x12 2.Followed by adjuvant radiation 3.Followed by adjuvant antiestrogen therapy No adverse effects due to participation in the upbeat clinical trial ----------------------------------------------------------- Current treatment:Completed 4 cycles ofdose dense Adriamycin and Cytoxan, today is cycle12Taxol Echocardiogram 04/15/2019: EF 50 to 55%.  Chemo toxicities: 1.  Chemotherapy-induced anemia: Monitoring closely 2.  Chemo-induced peripheral neuropathy: Mild 3.  Nailbed discoloration as well as discoloration of the extremities  Patient has been enrolled in SWOG 1714 study for peripheral neuropathy. With today's treatment she will complete adjuvant chemo. Radiation oncology has been consulted.  Return to clinic at the end of radiation therapy.    No orders of the defined types were placed in this encounter.  The patient has a good understanding of the overall plan. she agrees with it. she will call with any problems that may develop before the next visit here.  Total time spent: 30 mins including face to face time and time spent for planning, charting and coordination of care  Nicholas Lose, MD 09/06/2019  I, Cloyde Reams Dorshimer, am acting as scribe for Dr. Nicholas Lose.  I have reviewed the above documentation for accuracy and completeness, and I agree with the above.

## 2019-09-06 ENCOUNTER — Inpatient Hospital Stay: Payer: Medicaid Other

## 2019-09-06 ENCOUNTER — Inpatient Hospital Stay (HOSPITAL_BASED_OUTPATIENT_CLINIC_OR_DEPARTMENT_OTHER): Payer: Medicaid Other | Admitting: Hematology and Oncology

## 2019-09-06 ENCOUNTER — Other Ambulatory Visit: Payer: Self-pay

## 2019-09-06 ENCOUNTER — Encounter: Payer: Self-pay | Admitting: *Deleted

## 2019-09-06 ENCOUNTER — Inpatient Hospital Stay: Payer: Medicaid Other | Admitting: *Deleted

## 2019-09-06 DIAGNOSIS — C50212 Malignant neoplasm of upper-inner quadrant of left female breast: Secondary | ICD-10-CM

## 2019-09-06 DIAGNOSIS — Z5111 Encounter for antineoplastic chemotherapy: Secondary | ICD-10-CM | POA: Diagnosis not present

## 2019-09-06 DIAGNOSIS — Z170 Estrogen receptor positive status [ER+]: Secondary | ICD-10-CM

## 2019-09-06 DIAGNOSIS — Z006 Encounter for examination for normal comparison and control in clinical research program: Secondary | ICD-10-CM | POA: Diagnosis not present

## 2019-09-06 DIAGNOSIS — Z95828 Presence of other vascular implants and grafts: Secondary | ICD-10-CM

## 2019-09-06 LAB — CBC WITH DIFFERENTIAL (CANCER CENTER ONLY)
Abs Immature Granulocytes: 0.08 10*3/uL — ABNORMAL HIGH (ref 0.00–0.07)
Basophils Absolute: 0.1 10*3/uL (ref 0.0–0.1)
Basophils Relative: 1 %
Eosinophils Absolute: 0.1 10*3/uL (ref 0.0–0.5)
Eosinophils Relative: 2 %
HCT: 33.6 % — ABNORMAL LOW (ref 36.0–46.0)
Hemoglobin: 10.7 g/dL — ABNORMAL LOW (ref 12.0–15.0)
Immature Granulocytes: 1 %
Lymphocytes Relative: 25 %
Lymphs Abs: 1.5 10*3/uL (ref 0.7–4.0)
MCH: 29.5 pg (ref 26.0–34.0)
MCHC: 31.8 g/dL (ref 30.0–36.0)
MCV: 92.6 fL (ref 80.0–100.0)
Monocytes Absolute: 0.8 10*3/uL (ref 0.1–1.0)
Monocytes Relative: 13 %
Neutro Abs: 3.6 10*3/uL (ref 1.7–7.7)
Neutrophils Relative %: 58 %
Platelet Count: 310 10*3/uL (ref 150–400)
RBC: 3.63 MIL/uL — ABNORMAL LOW (ref 3.87–5.11)
RDW: 15.2 % (ref 11.5–15.5)
WBC Count: 6.1 10*3/uL (ref 4.0–10.5)
nRBC: 0 % (ref 0.0–0.2)

## 2019-09-06 LAB — CMP (CANCER CENTER ONLY)
ALT: 16 U/L (ref 0–44)
AST: 16 U/L (ref 15–41)
Albumin: 3.9 g/dL (ref 3.5–5.0)
Alkaline Phosphatase: 64 U/L (ref 38–126)
Anion gap: 10 (ref 5–15)
BUN: 9 mg/dL (ref 6–20)
CO2: 24 mmol/L (ref 22–32)
Calcium: 8.9 mg/dL (ref 8.9–10.3)
Chloride: 106 mmol/L (ref 98–111)
Creatinine: 0.77 mg/dL (ref 0.44–1.00)
GFR, Est AFR Am: >60 mL/min (ref >60)
GFR, Estimated: >60 mL/min (ref >60)
Glucose, Bld: 107 mg/dL — ABNORMAL HIGH (ref 70–99)
Potassium: 4.1 mmol/L (ref 3.5–5.1)
Sodium: 140 mmol/L (ref 135–145)
Total Bilirubin: <0.2 mg/dL — ABNORMAL LOW (ref 0.3–1.2)
Total Protein: 6.8 g/dL (ref 6.5–8.1)

## 2019-09-06 MED ORDER — SODIUM CHLORIDE 0.9 % IV SOLN
Freq: Once | INTRAVENOUS | Status: AC
  Filled 2019-09-06: qty 250

## 2019-09-06 MED ORDER — FAMOTIDINE IN NACL 20-0.9 MG/50ML-% IV SOLN
INTRAVENOUS | Status: AC
  Filled 2019-09-06: qty 50

## 2019-09-06 MED ORDER — DIPHENHYDRAMINE HCL 50 MG/ML IJ SOLN
50.0000 mg | Freq: Once | INTRAMUSCULAR | Status: AC
  Administered 2019-09-06: 50 mg via INTRAVENOUS

## 2019-09-06 MED ORDER — SODIUM CHLORIDE 0.9% FLUSH
10.0000 mL | Freq: Once | INTRAVENOUS | Status: AC
  Administered 2019-09-06: 10 mL
  Filled 2019-09-06: qty 10

## 2019-09-06 MED ORDER — SODIUM CHLORIDE 0.9 % IV SOLN
20.0000 mg | Freq: Once | INTRAVENOUS | Status: AC
  Administered 2019-09-06: 20 mg via INTRAVENOUS
  Filled 2019-09-06: qty 20

## 2019-09-06 MED ORDER — FAMOTIDINE IN NACL 20-0.9 MG/50ML-% IV SOLN
20.0000 mg | Freq: Once | INTRAVENOUS | Status: AC
  Administered 2019-09-06: 20 mg via INTRAVENOUS

## 2019-09-06 MED ORDER — HEPARIN SOD (PORK) LOCK FLUSH 100 UNIT/ML IV SOLN
500.0000 [IU] | Freq: Once | INTRAVENOUS | Status: AC | PRN
  Administered 2019-09-06: 500 [IU]
  Filled 2019-09-06: qty 5

## 2019-09-06 MED ORDER — SODIUM CHLORIDE 0.9 % IV SOLN
80.0000 mg/m2 | Freq: Once | INTRAVENOUS | Status: AC
  Administered 2019-09-06: 126 mg via INTRAVENOUS
  Filled 2019-09-06: qty 21

## 2019-09-06 MED ORDER — SODIUM CHLORIDE 0.9% FLUSH
10.0000 mL | INTRAVENOUS | Status: DC | PRN
  Administered 2019-09-06: 10 mL
  Filled 2019-09-06: qty 10

## 2019-09-06 MED ORDER — DIPHENHYDRAMINE HCL 50 MG/ML IJ SOLN
INTRAMUSCULAR | Status: AC
  Filled 2019-09-06: qty 1

## 2019-09-06 NOTE — Research (Signed)
DX:[id number], A PROSPECTIVE OBSERVATIONAL COHORT STUDY TO DEVELOP A PREDICTIVE MODEL OF TAXANE-INDUCED PERIPHERAL NEUROPATHY IN CANCER PATIENTS. 12 WEEKS VISIT: PROs; Questionnaires were given to patient to complete in clinic prior to lab appointment. Collected questionnaires and checked for completeness and accuracy.  History of Falls;  Not required this visit.  Solicited Neuropathy Events; Patient reports continued feeling of numbness and tingling in fingertips. It does not interfere with any functioning or ADLs. It is unchanged since last visit.  She states has not noticed any numbness in her feet this past month Grade 1 Paresthesia reported on CRF and Dr. Lindi Adie agrees and states this is related to Taxol.  Treatment; Last infusion of Pacitaxel cycle 12 planned today without any dose adjustments.  Assessment for Interventions for CIPN; Reviewed with patient and CRFs completed. Patient was icing her hands and feet during Taxol infusion to help prevent neuropathy. She did not ice on her last treatment and does not plan on doing it today because she is not sure it helps beyond making her hands and feet feel numb from the ice during the treatment.  She has been using tea tree oil twice a day to her hands and fingertips to help the alleviate neuropathy symptoms. She says this helps.  Neuropen Assessment; Completed per protocol by this research RN with assistance timing and recording by Doristine Johns, Electrical engineer.  [place] Assessment; Completed per protocol by this research RN with assistance timing and recording by Doristine Johns, Electrical engineer.  Timed Get Up and Go Test; Not required this visit.   Physician Assessments; Treatment [place] form completed and signed by Dr. Lindi Adie today. Plan; Informed patient of next study assessments due for week 24 on 12/06/19 (+/- 28 days). Research nurse will contact patient at least one month in advance to arrange appointment. We will try to schedule same day as another clinic  visit if possible. Patient verbalized understanding.  Thanked patient for participating in this study today. Encouraged her to call clinic if any questions or concerns prior to next appointment. She verbalized understanding. Foye Spurling, BSN, RN Clinical Research Nurse 09/06/2019 9:00 AM

## 2019-09-06 NOTE — Assessment & Plan Note (Signed)
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Adjuvant chemotherapy with dose dense Adriamycin and Cytoxan x4 followed by Taxol x12 2.Followed by adjuvant radiation 3.Followed by adjuvant antiestrogen therapy No adverse effects due to participation in the upbeat clinical trial ----------------------------------------------------------- Current treatment:Completed 4 cycles ofdose dense Adriamycin and Cytoxan, today is cycle12Taxol Echocardiogram 04/15/2019: EF 50 to 55%.  Chemo toxicities: 1.  Chemotherapy-induced anemia: Monitoring closely 2.  Chemo-induced peripheral neuropathy: Mild 3.  Nailbed discoloration as well as discoloration of the extremities  Patient has been enrolled in SWOG 1714 study for peripheral neuropathy. With today's treatment she will complete adjuvant chemo. Radiation oncology has been consulted.  Return to clinic at the end of radiation therapy.

## 2019-09-06 NOTE — Patient Instructions (Signed)
[facility] Discharge Instructions for Patients Receiving Chemotherapy  Today you received the following chemotherapy agents: Paclitaxel (Taxol)  To help prevent nausea and vomiting after your treatment, we encourage you to take your nausea medication as directed.    If you develop nausea and vomiting that is not controlled by your nausea medication, call the clinic.   BELOW ARE SYMPTOMS THAT SHOULD BE REPORTED IMMEDIATELY:  *FEVER GREATER THAN 100.5 F  *CHILLS WITH OR WITHOUT FEVER  NAUSEA AND VOMITING THAT IS NOT CONTROLLED WITH YOUR NAUSEA MEDICATION  *UNUSUAL SHORTNESS OF BREATH  *UNUSUAL BRUISING OR BLEEDING  TENDERNESS IN MOUTH AND THROAT WITH OR WITHOUT PRESENCE OF ULCERS  *URINARY PROBLEMS  *BOWEL PROBLEMS  UNUSUAL RASH Items with * indicate a potential emergency and should be followed up as soon as possible.  Feel free to call the clinic should you have any questions or concerns. The clinic phone number is [phone].  Please show the CHEMO ALERT CARD at check-in to the Emergency Department and triage nurse.  Coronavirus (COVID-19) Are you at risk?  Are you at risk for the Coronavirus (COVID-19)?  To be considered HIGH RISK for Coronavirus (COVID-19), you have to meet the following criteria:  . Traveled to [place], [place], [place], [place] or [place]; or in the [place] to [place], [place], [place], or [place]; and have fever, cough, and shortness of breath within the last 2 weeks of travel OR . Been in close contact with a person diagnosed with COVID-19 within the last 2 weeks and have fever, cough, and shortness of breath . IF YOU DO NOT MEET THESE CRITERIA, YOU ARE CONSIDERED LOW RISK FOR COVID-19.  What to do if you are HIGH RISK for COVID-19?  . If you are having a medical emergency, call 911. . Seek medical care right away. Before you go to a doctor's office, urgent care or emergency department, call ahead and tell them about  your recent travel, contact with someone diagnosed with COVID-19, and your symptoms. You should receive instructions from your physician's office regarding next steps of care.  . When you arrive at healthcare provider, tell the healthcare staff immediately you have returned from visiting [place], [place], [place], [place] or [place]; or traveled in the [place] to [place], [place], [place], or [place]; in the last two weeks or you have been in close contact with a person diagnosed with COVID-19 in the last 2 weeks.   . Tell the health care staff about your symptoms: fever, cough and shortness of breath. . After you have been seen by a medical provider, you will be either: o Tested for (COVID-19) and discharged home on quarantine except to seek medical care if symptoms worsen, and asked to  - Stay home and avoid contact with others until you get your results (4-5 days)  - Avoid travel on public transportation if possible (such as bus, train, or airplane) or o Sent to the Emergency Department by EMS for evaluation, COVID-19 testing, and possible admission depending on your condition and test results.  What to do if you are LOW RISK for COVID-19?  Reduce your risk of any infection by using the same precautions used for avoiding the common cold or flu:  . Wash your hands often with soap and warm water for at least 20 seconds.  If soap and water are not readily available, use an alcohol-based hand sanitizer with at least 60% alcohol.  . If coughing   or sneezing, cover your mouth and nose by coughing or sneezing into the elbow areas of your shirt or coat, into a tissue or into your sleeve (not your hands). . Avoid shaking hands with others and consider head nods or verbal greetings only. . Avoid touching your eyes, nose, or mouth with unwashed hands.  . Avoid close contact with people who are sick. . Avoid places or events with large numbers of people in one location, like concerts or sporting  events. . Carefully consider travel plans you have or are making. . If you are planning any travel outside or inside the [place], visit the CDC's Travelers' Health webpage for the latest health notices. . If you have some symptoms but not all symptoms, continue to monitor at home and seek medical attention if your symptoms worsen. . If you are having a medical emergency, call 911.   ADDITIONAL HEALTHCARE OPTIONS FOR PATIENTS  [facility] / e-Visit: [email or web address]         [facility]: [phone]  [facility]: [phone]                   [facility]: [phone]   

## 2019-09-12 ENCOUNTER — Ambulatory Visit: Payer: Medicaid Other | Attending: Radiation Oncology | Admitting: Radiation Oncology

## 2019-09-12 ENCOUNTER — Ambulatory Visit: Payer: Medicaid Other | Admitting: Radiation Oncology

## 2019-09-12 ENCOUNTER — Ambulatory Visit: Payer: Medicaid Other

## 2019-09-16 NOTE — Progress Notes (Signed)
Location of Breast Cancer: Malignant neoplasm of upper-inner quadrant of left breast in female, estrogen receptor positive   Histology per Pathology Report: 03/22/19: 1. Breast, lumpectomy, Left w/seed - INVASIVE DUCTAL CARCINOMA, GRADE 3, 2.8 CM - DUCTAL CARCINOMA IN SITU, HIGH NUCLEAR GRADE - CARCINOMA IS 0.2 CM FROM ANTERIOR MARGIN AND 0.4 CM FROM POSTERIOR MARGIN - LYMPHOVASCULAR INVASION IS PRESENT; PERINEURAL INVASION IS NOT IDENTIFIED - SKIN IS NOT INVOLVED - BIOPSY SITE CHANGES - SEE ONCOLOGY TABLE 2. Medical device, removal, radioactive seed #2 - GROSS DIAGNOSIS ONLY: MEDIAL DEVICE CONSISTENT WITH LOCALIZATION SEED. 3. Breast, excision, Superior Posterior Margin - BENIGN BREAST PARENCHYMA, NEGATIVE FOR IN SITU OR INVASIVE CARCINOMA 4. Lymph node, sentinel, biopsy, Left Axillary - LYMPH NODE, NEGATIVE FOR CARCINOMA (0/1) 5. Lymph node, sentinel, biopsy, Left - LYMPH NODE, NEGATIVE FOR CARCINOMA (0/1) 6. Lymph node, sentinel, biopsy, Left - METASTATIC CARCINOMA TO ONE LYMPH NODE (1/1) - METASTATIC CARCINOMA MEASURES 0.7 CM - NO EVIDENCE OF EXTRANODAL EXTENSION   Receptor Status: ER(100%), PR (80%), Her2-neu (negative), Ki-(20%)  Did patient present with symptoms (if so, please note symptoms) or was this found on screening mammography?: She presented with a palpable left breast lump, which she first noticed around November 2019. She underwent bilateral diagnostic mammography with tomography and left breast ultrasonography at [facility] 01[facility] showing: highly suspicious left breast mass at 9 o'clock corresponding with the patient's palpable lump; no suspicious left axillary lymphadenopathy; no mammographic evidence of malignancy on the right.  Biopsy on 02/02/2019 showed: invasive ductal carcinoma, grade 3, with perineural invasion present. Prognostic indicators significant for: estrogen receptor, 100% positive with strong staining intensity and progesterone receptor, 80%  positive with moderate staining intensity. Proliferation marker Ki67 at 20%. HER2 equivocal by immunohistochemistry, and negative by FISH.  Past/Anticipated interventions by surgeon, if any: PROCEDURE:   Procedure(s):  LEFT BREAST LUMPECTOMY X2 WITH RADIOACTIVE SEED AND LEFT AXILLARY SENTINEL LYMPH NODE BIOPSY, Injection of peri areolar area of breast with methylene blue (1.0 cc), deep sentinel lymph node biopsy  SURGEON:   [facility], M.D.  Past/Anticipated interventions by medical oncology, if any: Chemotherapy 09/06/19 Per Dr. [facility]: ASSESSMENT & PLAN:  Malignant neoplasm of upper-inner quadrant of left breast in female, estrogen receptor positive ([facility]) 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Adjuvant chemotherapy with dose dense Adriamycin and Cytoxan x4 followed by Taxol x12 2.Followed by adjuvant radiation 3.Followed by adjuvant antiestrogen therapy No adverse effects due to participation in the upbeat clinical trial ----------------------------------------------------------- Current treatment:Completed 4 cycles ofdose dense Adriamycin and Cytoxan, today is cycle12Taxol Echocardiogram 04/15/2019: EF 50 to 55%.  Chemo toxicities: 1.  Chemotherapy-induced anemia: Monitoring closely 2.  Chemo-induced peripheral neuropathy: Mild 3.  Nailbed discoloration as well as discoloration of the extremities  Patient has been enrolled in SWOG 1714 study for peripheral neuropathy. With today's treatment she will complete adjuvant chemo. Radiation oncology has been consulted.  Lymphedema issues, if any: Pt reports bilateral axilla "get tight". Pt able to raise arm above head.  Pain issues, if any: Pt denies c/o pain.  SAFETY  ISSUES:  Prior radiation? no  Pacemaker/ICD? no  Possible current pregnancy?no  Is the patient on methotrexate? no  Current Complaints / other details:  Pt presents today for consult with Dr. Sondra [facility] for Radiation Oncology.  Pt is unaccompanied.  BP (!) 139/96 (BP Location: Left Arm, Patient Position: Sitting, Cuff Size: Normal)   Pulse 91   Temp 97.8 F (36.6 C)   Resp 20   Wt 128 lb (58.1 kg)   SpO2 100%   BMI 23.41 kg/m   Wt Readings from Last 3 Encounters:  09/19/19 128 lb (58.1 kg)  09/06/19 125 lb 9.6 oz (57 kg)  08/30/19 125 lb 8 oz (56.9 kg)       Linda Sousa, RN 09/19/2019,9:27 AM

## 2019-09-19 ENCOUNTER — Ambulatory Visit
Admission: RE | Admit: 2019-09-19 | Discharge: 2019-09-19 | Disposition: A | Discharge status: Home / Self Care | Payer: Medicaid Other | Source: Ambulatory Visit | Attending: Radiation Oncology | Admitting: Radiation Oncology

## 2019-09-19 ENCOUNTER — Other Ambulatory Visit: Payer: Self-pay

## 2019-09-19 ENCOUNTER — Ambulatory Visit: Payer: Medicaid Other | Admitting: Radiation Oncology

## 2019-09-19 ENCOUNTER — Encounter: Payer: Self-pay | Admitting: Radiation Oncology

## 2019-09-19 VITALS — BP 139/96 | HR 91 | Temp 97.8°F | Resp 20 | Wt 128.0 lb

## 2019-09-19 DIAGNOSIS — G629 Polyneuropathy, unspecified: Secondary | ICD-10-CM | POA: Diagnosis not present

## 2019-09-19 DIAGNOSIS — Z9221 Personal history of antineoplastic chemotherapy: Secondary | ICD-10-CM | POA: Diagnosis not present

## 2019-09-19 DIAGNOSIS — Z170 Estrogen receptor positive status [ER+]: Secondary | ICD-10-CM | POA: Diagnosis not present

## 2019-09-19 DIAGNOSIS — C50212 Malignant neoplasm of upper-inner quadrant of left female breast: Secondary | ICD-10-CM | POA: Diagnosis not present

## 2019-09-19 NOTE — Progress Notes (Signed)
Radiation Oncology         (336) [phone] ________________________________  Name: [facility] MRN: [id number]  Date: 09/19/2019  DOB: 05-03-1961  Re-Evaluation Note  CC: Clent Demark, PA-C  Clent Demark, PA-C    ICD-10-CM   1. Malignant neoplasm of upper-inner quadrant of left breast in female, estrogen receptor positive ([place])  C50.212    Z17.0     Diagnosis:  Stage IIA (pT2, pN1a, cM0) Left Breast UIQ, Invasive Ductal Carcinoma with DCIS, ER+ / PR+ / Her2-, Grade 3  Narrative: The patient returns today to discuss radiation treatment options. She was seen in the multidisciplinary breast clinic on 02/09/2019. At that time, the plan was for breast MRI, breast conserving surgery, adjuvant radiation therapy, and adjuvant antiestrogen therapy.  MRI of bilateral breasts on 02/16/2019 showed dominant biopsy-proven invasive carcinoma within the inner left breast, involving the lower inner and upper inner quadrants, the main/central component measuring 2.9 x 2.6 x 3.2 cm, but contiguous linear non-mass enhancement extended from the anterior margin of the mass across the midline to the lowe outer quadrant increasing the AP dimension to 3.8 cm. Additional contiguous linear non-mass enhancement extended posteriorly from the mass in the upper inner quadrant to posterior depth, measuring an additional 1.6 cm extent, increasing the overall dimension to approximately 5 cm. The biopsy-proven invasive carcinoma within the inner left breast abutted the overlying skin, and abnormal enhancement of the skin indicated associated skin involvement. Finally, it showed an additional small mass within the upper inner quadrant of the left breast, far superior, at posterior depth, measuring 5 mm, suspicious for additional multicentric disease. No evidence of malignancy within the right breast. No evidence of metastatic lymphadenopathy.  MRI guided biopsy of left breast on 03/04/2019 showed benign muscle and  adipose tissue of the left upper inner posterior breast and fibrocystic changes of the left retroareolar breast.  Patient opted to proceed with left breast lumpectomy x2 with left axillary sentinel lymph node biopsy on 03/22/2019. Pathology from the procedure revealed grade 3 invasive ductal carcinoma and nuclear high-grade ductal carcinoma in situ. Carcinoma is 0.2 cm from anterior margin and 0.4 cm from posterior margin. Lymphovascular invasion is present; perineural invasion not identified. Skin was not involved. Superior posterior margin was also biopsied and showed benign breast parenchyma and was negative for in situ or invasive carcinoma. Three sentinel lymph nodes were biopsied, one being axillary. Left axillary lymph node and one other lymph node were both negative for carcinoma. The third lymph node, however, was positive for metastatic carcinoma measuring 0.7 cm. No evidence of extranodal extension.   MammaPrint on 03/22/2019 showed high risk (94.6%) luminal type B with high chemotherapy benefit.  Echocardiogram on 04/15/2019 showed an EF of 50-55%.  She has been treated with adjuvant chemotherapy with dose dense Adriamycin and Cytoxan x 4 followed by Taxol x 12 beginning 04/26/2019 under the care of Dr. Lindi Adie. Patient had experienced chemotherapy-induced anemia, mild chemotherapy-induced peripheral neuropathy, and nailbed/extremity discoloration. She completed chemotherapy on 09/06/2019.  On review of systems, the patient reports issues with peripheral neuropathy.  This primarily involves her fingertips she denies any pain in the left breast nipple discharge or bleeding she denies any problems with swelling in her left arm or hand.   Allergies:  has No Known Allergies.  Meds: Current Outpatient Medications  Medication Sig Dispense Refill  . acetaminophen (TYLENOL) 500 MG tablet Take 1,000 mg by mouth every 6 (six) hours as needed for moderate pain.    Marland Kitchen  lidocaine-prilocaine (EMLA)  cream Apply to affected area once 30 g 3   No current facility-administered medications for this encounter.   Facility-Administered Medications Ordered in Other Encounters  Medication Dose Route Frequency Provider Last Rate Last Admin  . pegfilgrastim (NEULASTA) injection 6 mg  6 mg Subcutaneous Once Nicholas Lose, MD        Physical Findings: The patient is in no acute distress. Patient is alert and oriented.  weight is 128 lb (58.1 kg). Her temperature is 97.8 F (36.6 C). Her blood pressure is 139/96 (abnormal) and her pulse is 91. Her respiration is 20 and oxygen saturation is 100%.  No significant changes. Lungs are clear to auscultation bilaterally. Heart has regular rate and rhythm. No palpable cervical, supraclavicular, or axillary adenopathy. Abdomen soft, non-tender, normal bowel sounds. Right breast: no palpable mass, nipple discharge or bleeding. Left breast:  areolar scar noted in the medial aspect of the left breast.  No dominant mass appreciated in the breast nipple discharge or bleeding  Lab Findings: Lab Results  Component Value Date   WBC 6.1 09/06/2019   HGB 10.7 (L) 09/06/2019   HCT 33.6 (L) 09/06/2019   MCV 92.6 09/06/2019   PLT 310 09/06/2019    Radiographic Findings: No results found.  Impression: Stage IIA (pT2, pN1a, cM0) Left Breast UIQ, Invasive Ductal Carcinoma with DCIS, ER+ / PR+ / Her2-, Grade 3  Patient will be a good candidate for radiation therapy as breast conserving treatment.  Given the findings of axillary involvement would also recommend elective coverage of the axillary region.  I discussed the general course of treatment side effects and potential long-term toxicities of radiation therapy in the situation with the patient.  She appears to understand and wishes to proceed with planned course of treatment  Plan:  Patient is scheduled for CT simulation later today.  Anticipate 6-1/2 weeks of radiation therapy.  Will use cardiac sparing  techniques if necessary.  -----------------------------------  Blair Promise, PhD, MD  This document serves as a record of services personally performed by Gery Pray, MD. It was created on his behalf by Clerance Lav, a trained medical scribe. The creation of this record is based on the scribe's personal observations and the provider's statements to them. This document has been checked and approved by the attending provider.

## 2019-09-19 NOTE — Progress Notes (Signed)
Radiation Oncology         [phone]) [phone] ________________________________  Name: [facility] MRN: [id number]  Date: 09/19/2019  DOB: 03-28-1961  SIMULATION AND TREATMENT PLANNING NOTE    ICD-10-CM   1. Malignant neoplasm of upper-inner quadrant of left breast in female, estrogen receptor positive ([place])  C50.212    Z17.0     DIAGNOSIS: Stage IIA (pT2, pN1a, cM0) Left Breast UIQ, Invasive Ductal Carcinoma with DCIS, ER+ / PR+ / Her2-, Grade 3  NARRATIVE:  The patient was brought to the [facility].  Identity was confirmed.  All relevant records and images related to the planned course of therapy were reviewed.  The patient freely provided informed written consent to proceed with treatment after reviewing the details related to the planned course of therapy. The consent form was witnessed and verified by the simulation staff.  Then, the patient was set-up in a stable reproducible  supine position for radiation therapy.  CT images were obtained.  Surface markings were placed.  The CT images were loaded into the planning software.  Then the target and avoidance structures were contoured.  Treatment planning then occurred.  The radiation prescription was entered and confirmed.  Then, I designed and supervised the construction of a total of 7 medically necessary complex treatment devices.  I have requested : 3D Simulation  I have requested a DVH of the following structures: Lumpectomy cavity, heart, lungs.  I have ordered:CBC  PLAN:  The patient will receive 50.4 Gy in 28 fractions directed at the left breast and axillary area.  The patient will then proceed with a boost to the lumpectomy cavity of 12 Gy in 6 fractions for a cumulative dose of 62.4 Gray.   Optical Surface Tracking Plan:  Since intensity modulated radiotherapy (IMRT) and 3D conformal radiation treatment methods are predicated on accurate and precise positioning for treatment, intrafraction motion monitoring is  medically necessary to ensure accurate and safe treatment delivery.  The ability to quantify intrafraction motion without excessive ionizing radiation dose can only be performed with optical surface tracking. Accordingly, surface imaging offers the opportunity to obtain 3D measurements of patient position throughout IMRT and 3D treatments without excessive radiation exposure.  I am ordering optical surface tracking for this patient's upcoming course of radiotherapy.  Special treatment procedure was performed today due to the extra time and effort required by myself to plan and prepare this patient for deep inspiration breath hold technique.  I have determined cardiac sparing to be of benefit to this patient to prevent long term cardiac damage due to radiation of the heart.  Bellows were placed on the patient's abdomen. To facilitate cardiac sparing, the patient was coached by the radiation therapists on breath hold techniques and breathing practice was performed. Practice waveforms were obtained. The patient was then scanned while maintaining breath hold in the treatment position.  This image was then transferred over to the imaging specialist. The imaging specialist then created a fusion of the free breathing and breath hold scans using the chest wall as the stable structure. I personally reviewed the fusion in axial, coronal and sagittal image planes.  Excellent cardiac sparing was obtained.  I felt the patient is an appropriate candidate for breath hold and the patient will be treated as such.  The image fusion was then reviewed with the patient to reinforce the necessity of reproducible breath hold. ________________________________    Blair Promise, PhD, MD  This document serves as a record  of services personally performed by Gery Pray, MD. It was created on his behalf by Clerance Lav, a trained medical scribe. The creation of this record is based on the scribe's personal observations and the  provider's statements to them. This document has been checked and approved by the attending provider.

## 2019-09-19 NOTE — Patient Instructions (Signed)
Coronavirus (COVID-19) Are you at risk?  Are you at risk for the Coronavirus (COVID-19)?  To be considered HIGH RISK for Coronavirus (COVID-19), you have to meet the following criteria:  . Traveled to [place], [place], [place], [place] or [place]; or in the [place] to [place], [place], [place], or [place]; and have fever, cough, and shortness of breath within the last 2 weeks of travel OR . Been in close contact with a person diagnosed with COVID-19 within the last 2 weeks and have fever, cough, and shortness of breath . IF YOU DO NOT MEET THESE CRITERIA, YOU ARE CONSIDERED LOW RISK FOR COVID-19.  What to do if you are HIGH RISK for COVID-19?  . If you are having a medical emergency, call 911. . Seek medical care right away. Before you go to a doctor's office, urgent care or emergency department, call ahead and tell them about your recent travel, contact with someone diagnosed with COVID-19, and your symptoms. You should receive instructions from your physician's office regarding next steps of care.  . When you arrive at healthcare provider, tell the healthcare staff immediately you have returned from visiting [place], [place], [place], [place] or [place]; or traveled in the [place] to [place], [place], [place], or [place]; in the last two weeks or you have been in close contact with a person diagnosed with COVID-19 in the last 2 weeks.   . Tell the health care staff about your symptoms: fever, cough and shortness of breath. . After you have been seen by a medical provider, you will be either: o Tested for (COVID-19) and discharged home on quarantine except to seek medical care if symptoms worsen, and asked to  - Stay home and avoid contact with others until you get your results (4-5 days)  - Avoid travel on public transportation if possible (such as bus, train, or airplane) or o Sent to the Emergency Department by EMS for evaluation, COVID-19 testing, and possible  admission depending on your condition and test results.  What to do if you are LOW RISK for COVID-19?  Reduce your risk of any infection by using the same precautions used for avoiding the common cold or flu:  . Wash your hands often with soap and warm water for at least 20 seconds.  If soap and water are not readily available, use an alcohol-based hand sanitizer with at least 60% alcohol.  . If coughing or sneezing, cover your mouth and nose by coughing or sneezing into the elbow areas of your shirt or coat, into a tissue or into your sleeve (not your hands). . Avoid shaking hands with others and consider head nods or verbal greetings only. . Avoid touching your eyes, nose, or mouth with unwashed hands.  . Avoid close contact with people who are sick. . Avoid places or events with large numbers of people in one location, like concerts or sporting events. . Carefully consider travel plans you have or are making. . If you are planning any travel outside or inside the [place], visit the CDC's Travelers' Health webpage for the latest health notices. . If you have some symptoms but not all symptoms, continue to monitor at home and seek medical attention if your symptoms worsen. . If you are having a medical emergency, call 911.   ADDITIONAL HEALTHCARE OPTIONS FOR PATIENTS  [facility] / e-Visit: [email or web address]         [facility]: [phone]  [facility]: [phone]                   [facility]: [phone]   

## 2019-09-21 ENCOUNTER — Encounter: Payer: Self-pay | Admitting: *Deleted

## 2019-09-26 ENCOUNTER — Telehealth: Payer: Self-pay | Admitting: Hematology and Oncology

## 2019-09-26 DIAGNOSIS — C50212 Malignant neoplasm of upper-inner quadrant of left female breast: Secondary | ICD-10-CM | POA: Insufficient documentation

## 2019-09-26 DIAGNOSIS — Z170 Estrogen receptor positive status [ER+]: Secondary | ICD-10-CM | POA: Diagnosis present

## 2019-09-26 NOTE — Telephone Encounter (Signed)
Scheduled appt per 1/247 sch message - mailed reminder letter with appt date and time

## 2019-09-26 NOTE — Progress Notes (Signed)
  Radiation Oncology         (336) [phone] ________________________________  Name: [facility] MRN: [id number]  Date: 09/27/2019  DOB: Jul 07, 1961  Simulation Verification Note    ICD-10-CM   1. Malignant neoplasm of upper-inner quadrant of left breast in female, estrogen receptor positive ([place])  C50.212    Z17.0     NARRATIVE: The patient was brought to the treatment unit and placed in the planned treatment position. The clinical setup was verified. Then port films were obtained and uploaded to the radiation oncology medical record software.   -----------------------------------  Blair Promise, PhD, MD  This document serves as a record of services personally performed by Gery Pray, MD. It was created on his behalf by Clerance Lav, a trained medical scribe. The creation of this record is based on the scribe's personal observations and the provider's statements to them. This document has been checked and approved by the attending provider.

## 2019-09-27 ENCOUNTER — Other Ambulatory Visit: Payer: Self-pay

## 2019-09-27 ENCOUNTER — Ambulatory Visit
Admission: RE | Admit: 2019-09-27 | Discharge: 2019-09-27 | Disposition: A | Discharge status: Home / Self Care | Payer: Medicaid Other | Source: Ambulatory Visit | Attending: Radiation Oncology | Admitting: Radiation Oncology

## 2019-09-27 DIAGNOSIS — C50212 Malignant neoplasm of upper-inner quadrant of left female breast: Secondary | ICD-10-CM | POA: Diagnosis not present

## 2019-09-28 ENCOUNTER — Ambulatory Visit
Admission: RE | Admit: 2019-09-28 | Discharge: 2019-09-28 | Disposition: A | Discharge status: Home / Self Care | Payer: Medicaid Other | Source: Ambulatory Visit | Attending: Radiation Oncology | Admitting: Radiation Oncology

## 2019-09-28 ENCOUNTER — Other Ambulatory Visit: Payer: Self-pay

## 2019-09-28 DIAGNOSIS — C50212 Malignant neoplasm of upper-inner quadrant of left female breast: Secondary | ICD-10-CM | POA: Diagnosis not present

## 2019-09-29 ENCOUNTER — Ambulatory Visit
Admission: RE | Admit: 2019-09-29 | Discharge: 2019-09-29 | Disposition: A | Discharge status: Home / Self Care | Payer: Medicaid Other | Source: Ambulatory Visit | Attending: Radiation Oncology | Admitting: Radiation Oncology

## 2019-09-29 ENCOUNTER — Other Ambulatory Visit: Payer: Self-pay

## 2019-09-29 DIAGNOSIS — C50212 Malignant neoplasm of upper-inner quadrant of left female breast: Secondary | ICD-10-CM | POA: Diagnosis not present

## 2019-09-30 ENCOUNTER — Ambulatory Visit
Admission: RE | Admit: 2019-09-30 | Discharge: 2019-09-30 | Disposition: A | Discharge status: Home / Self Care | Payer: Medicaid Other | Source: Ambulatory Visit | Attending: Radiation Oncology | Admitting: Radiation Oncology

## 2019-09-30 ENCOUNTER — Other Ambulatory Visit: Payer: Self-pay

## 2019-09-30 DIAGNOSIS — C50212 Malignant neoplasm of upper-inner quadrant of left female breast: Secondary | ICD-10-CM | POA: Diagnosis not present

## 2019-10-03 ENCOUNTER — Other Ambulatory Visit: Payer: Self-pay

## 2019-10-03 ENCOUNTER — Ambulatory Visit
Admission: RE | Admit: 2019-10-03 | Discharge: 2019-10-03 | Disposition: A | Discharge status: Home / Self Care | Payer: Medicaid Other | Source: Ambulatory Visit | Attending: Radiation Oncology | Admitting: Radiation Oncology

## 2019-10-03 DIAGNOSIS — C50212 Malignant neoplasm of upper-inner quadrant of left female breast: Secondary | ICD-10-CM | POA: Diagnosis not present

## 2019-10-04 ENCOUNTER — Ambulatory Visit
Admission: RE | Admit: 2019-10-04 | Discharge: 2019-10-04 | Disposition: A | Discharge status: Home / Self Care | Payer: Medicaid Other | Source: Ambulatory Visit | Attending: Radiation Oncology | Admitting: Radiation Oncology

## 2019-10-04 ENCOUNTER — Other Ambulatory Visit: Payer: Self-pay

## 2019-10-04 DIAGNOSIS — C50212 Malignant neoplasm of upper-inner quadrant of left female breast: Secondary | ICD-10-CM

## 2019-10-04 MED ORDER — ALRA NON-METALLIC DEODORANT (RAD-ONC)
1.0000 "application " | Freq: Once | TOPICAL | Status: AC
  Administered 2019-10-04: 1 via TOPICAL

## 2019-10-04 MED ORDER — SONAFINE EX EMUL
1.0000 "application " | Freq: Two times a day (BID) | CUTANEOUS | Status: DC
  Administered 2019-10-04: 1 via TOPICAL

## 2019-10-05 ENCOUNTER — Ambulatory Visit
Admission: RE | Admit: 2019-10-05 | Discharge: 2019-10-05 | Disposition: A | Discharge status: Home / Self Care | Payer: Medicaid Other | Source: Ambulatory Visit | Attending: Radiation Oncology | Admitting: Radiation Oncology

## 2019-10-05 ENCOUNTER — Other Ambulatory Visit: Payer: Self-pay

## 2019-10-05 DIAGNOSIS — C50212 Malignant neoplasm of upper-inner quadrant of left female breast: Secondary | ICD-10-CM | POA: Diagnosis not present

## 2019-10-06 ENCOUNTER — Other Ambulatory Visit: Payer: Self-pay

## 2019-10-06 ENCOUNTER — Ambulatory Visit: Payer: Medicaid Other

## 2019-10-06 DIAGNOSIS — C50212 Malignant neoplasm of upper-inner quadrant of left female breast: Secondary | ICD-10-CM | POA: Diagnosis not present

## 2019-10-07 ENCOUNTER — Ambulatory Visit: Payer: Medicaid Other

## 2019-10-07 ENCOUNTER — Other Ambulatory Visit: Payer: Self-pay

## 2019-10-07 DIAGNOSIS — C50212 Malignant neoplasm of upper-inner quadrant of left female breast: Secondary | ICD-10-CM | POA: Diagnosis not present

## 2019-10-10 ENCOUNTER — Other Ambulatory Visit: Payer: Self-pay

## 2019-10-10 ENCOUNTER — Ambulatory Visit
Admission: RE | Admit: 2019-10-10 | Discharge: 2019-10-10 | Disposition: A | Discharge status: Home / Self Care | Payer: Medicaid Other | Source: Ambulatory Visit | Attending: Radiation Oncology | Admitting: Radiation Oncology

## 2019-10-10 DIAGNOSIS — C50212 Malignant neoplasm of upper-inner quadrant of left female breast: Secondary | ICD-10-CM | POA: Diagnosis not present

## 2019-10-11 ENCOUNTER — Other Ambulatory Visit: Payer: Self-pay

## 2019-10-11 ENCOUNTER — Ambulatory Visit
Admission: RE | Admit: 2019-10-11 | Discharge: 2019-10-11 | Disposition: A | Discharge status: Home / Self Care | Payer: Medicaid Other | Source: Ambulatory Visit | Attending: Radiation Oncology | Admitting: Radiation Oncology

## 2019-10-11 DIAGNOSIS — C50212 Malignant neoplasm of upper-inner quadrant of left female breast: Secondary | ICD-10-CM | POA: Diagnosis not present

## 2019-10-12 ENCOUNTER — Other Ambulatory Visit: Payer: Self-pay

## 2019-10-12 ENCOUNTER — Ambulatory Visit
Admission: RE | Admit: 2019-10-12 | Discharge: 2019-10-12 | Disposition: A | Discharge status: Home / Self Care | Payer: Medicaid Other | Source: Ambulatory Visit | Attending: Radiation Oncology | Admitting: Radiation Oncology

## 2019-10-12 DIAGNOSIS — C50212 Malignant neoplasm of upper-inner quadrant of left female breast: Secondary | ICD-10-CM | POA: Diagnosis not present

## 2019-10-13 ENCOUNTER — Ambulatory Visit: Payer: Medicaid Other

## 2019-10-14 ENCOUNTER — Ambulatory Visit
Admission: RE | Admit: 2019-10-14 | Discharge: 2019-10-14 | Disposition: A | Discharge status: Home / Self Care | Payer: Medicaid Other | Source: Ambulatory Visit | Attending: Radiation Oncology | Admitting: Radiation Oncology

## 2019-10-14 ENCOUNTER — Other Ambulatory Visit: Payer: Self-pay

## 2019-10-14 DIAGNOSIS — C50212 Malignant neoplasm of upper-inner quadrant of left female breast: Secondary | ICD-10-CM | POA: Diagnosis not present

## 2019-10-17 ENCOUNTER — Other Ambulatory Visit: Payer: Self-pay

## 2019-10-17 ENCOUNTER — Ambulatory Visit
Admission: RE | Admit: 2019-10-17 | Discharge: 2019-10-17 | Disposition: A | Discharge status: Home / Self Care | Payer: Medicaid Other | Source: Ambulatory Visit | Attending: Radiation Oncology | Admitting: Radiation Oncology

## 2019-10-17 DIAGNOSIS — C50212 Malignant neoplasm of upper-inner quadrant of left female breast: Secondary | ICD-10-CM | POA: Diagnosis not present

## 2019-10-18 ENCOUNTER — Ambulatory Visit
Admission: RE | Admit: 2019-10-18 | Discharge: 2019-10-18 | Disposition: A | Discharge status: Home / Self Care | Payer: Medicaid Other | Source: Ambulatory Visit | Attending: Radiation Oncology | Admitting: Radiation Oncology

## 2019-10-18 ENCOUNTER — Other Ambulatory Visit: Payer: Self-pay

## 2019-10-18 DIAGNOSIS — C50212 Malignant neoplasm of upper-inner quadrant of left female breast: Secondary | ICD-10-CM | POA: Diagnosis not present

## 2019-10-19 ENCOUNTER — Other Ambulatory Visit: Payer: Self-pay

## 2019-10-19 ENCOUNTER — Ambulatory Visit
Admission: RE | Admit: 2019-10-19 | Discharge: 2019-10-19 | Disposition: A | Discharge status: Home / Self Care | Payer: Medicaid Other | Source: Ambulatory Visit | Attending: Radiation Oncology | Admitting: Radiation Oncology

## 2019-10-19 DIAGNOSIS — C50212 Malignant neoplasm of upper-inner quadrant of left female breast: Secondary | ICD-10-CM | POA: Diagnosis not present

## 2019-10-20 ENCOUNTER — Other Ambulatory Visit: Payer: Self-pay

## 2019-10-20 ENCOUNTER — Ambulatory Visit
Admission: RE | Admit: 2019-10-20 | Discharge: 2019-10-20 | Disposition: A | Discharge status: Home / Self Care | Payer: Medicaid Other | Source: Ambulatory Visit | Attending: Radiation Oncology | Admitting: Radiation Oncology

## 2019-10-20 DIAGNOSIS — C50212 Malignant neoplasm of upper-inner quadrant of left female breast: Secondary | ICD-10-CM | POA: Diagnosis not present

## 2019-10-21 ENCOUNTER — Ambulatory Visit
Admission: RE | Admit: 2019-10-21 | Discharge: 2019-10-21 | Disposition: A | Discharge status: Home / Self Care | Payer: Medicaid Other | Source: Ambulatory Visit | Attending: Radiation Oncology | Admitting: Radiation Oncology

## 2019-10-21 ENCOUNTER — Other Ambulatory Visit: Payer: Self-pay

## 2019-10-21 DIAGNOSIS — C50212 Malignant neoplasm of upper-inner quadrant of left female breast: Secondary | ICD-10-CM | POA: Diagnosis not present

## 2019-10-24 ENCOUNTER — Ambulatory Visit
Admission: RE | Admit: 2019-10-24 | Discharge: 2019-10-24 | Disposition: A | Discharge status: Home / Self Care | Payer: Medicaid Other | Source: Ambulatory Visit | Attending: Radiation Oncology | Admitting: Radiation Oncology

## 2019-10-24 ENCOUNTER — Other Ambulatory Visit: Payer: Self-pay

## 2019-10-24 DIAGNOSIS — Z170 Estrogen receptor positive status [ER+]: Secondary | ICD-10-CM | POA: Diagnosis present

## 2019-10-24 DIAGNOSIS — C50212 Malignant neoplasm of upper-inner quadrant of left female breast: Secondary | ICD-10-CM | POA: Insufficient documentation

## 2019-10-25 ENCOUNTER — Ambulatory Visit
Admission: RE | Admit: 2019-10-25 | Discharge: 2019-10-25 | Disposition: A | Discharge status: Home / Self Care | Payer: Medicaid Other | Source: Ambulatory Visit | Attending: Radiation Oncology | Admitting: Radiation Oncology

## 2019-10-25 ENCOUNTER — Other Ambulatory Visit: Payer: Self-pay

## 2019-10-25 DIAGNOSIS — C50212 Malignant neoplasm of upper-inner quadrant of left female breast: Secondary | ICD-10-CM | POA: Diagnosis not present

## 2019-10-26 ENCOUNTER — Encounter: Payer: Self-pay | Admitting: *Deleted

## 2019-10-26 ENCOUNTER — Other Ambulatory Visit: Payer: Self-pay

## 2019-10-26 ENCOUNTER — Ambulatory Visit
Admission: RE | Admit: 2019-10-26 | Discharge: 2019-10-26 | Disposition: A | Discharge status: Home / Self Care | Payer: Medicaid Other | Source: Ambulatory Visit | Attending: Radiation Oncology | Admitting: Radiation Oncology

## 2019-10-26 DIAGNOSIS — C50212 Malignant neoplasm of upper-inner quadrant of left female breast: Secondary | ICD-10-CM | POA: Diagnosis not present

## 2019-10-26 NOTE — Progress Notes (Signed)
[id number]; Met patient briefly after her XRT this morning. Informed her of 24 months research visit for [phone] to be completed on 11/14/19 since patient is seeing Dr. Lindi Adie on this day and we can use the exam room for the study assessments. Patient agreed. Informed patient I will add on a research appointment after her appointment with Dr. Lindi Adie on 3/22 and she verbalized understanding.  Thanked patient for her time and look forward to seeing her in a few weeks. Foye Spurling, BSN, RN Clinical Research Nurse 10/26/2019

## 2019-10-27 ENCOUNTER — Other Ambulatory Visit: Payer: Self-pay

## 2019-10-27 ENCOUNTER — Ambulatory Visit
Admission: RE | Admit: 2019-10-27 | Discharge: 2019-10-27 | Disposition: A | Discharge status: Home / Self Care | Payer: Medicaid Other | Source: Ambulatory Visit | Attending: Radiation Oncology | Admitting: Radiation Oncology

## 2019-10-27 DIAGNOSIS — C50212 Malignant neoplasm of upper-inner quadrant of left female breast: Secondary | ICD-10-CM | POA: Diagnosis not present

## 2019-10-28 ENCOUNTER — Other Ambulatory Visit: Payer: Self-pay

## 2019-10-28 ENCOUNTER — Ambulatory Visit
Admission: RE | Admit: 2019-10-28 | Discharge: 2019-10-28 | Disposition: A | Discharge status: Home / Self Care | Payer: Medicaid Other | Source: Ambulatory Visit | Attending: Radiation Oncology | Admitting: Radiation Oncology

## 2019-10-28 DIAGNOSIS — C50212 Malignant neoplasm of upper-inner quadrant of left female breast: Secondary | ICD-10-CM | POA: Diagnosis not present

## 2019-10-31 ENCOUNTER — Ambulatory Visit
Admission: RE | Admit: 2019-10-31 | Discharge: 2019-10-31 | Disposition: A | Discharge status: Home / Self Care | Payer: Medicaid Other | Source: Ambulatory Visit | Attending: Radiation Oncology | Admitting: Radiation Oncology

## 2019-10-31 ENCOUNTER — Other Ambulatory Visit: Payer: Self-pay

## 2019-10-31 DIAGNOSIS — C50212 Malignant neoplasm of upper-inner quadrant of left female breast: Secondary | ICD-10-CM | POA: Diagnosis not present

## 2019-11-01 ENCOUNTER — Other Ambulatory Visit: Payer: Self-pay

## 2019-11-01 ENCOUNTER — Ambulatory Visit
Admission: RE | Admit: 2019-11-01 | Discharge: 2019-11-01 | Disposition: A | Discharge status: Home / Self Care | Payer: Medicaid Other | Source: Ambulatory Visit | Attending: Radiation Oncology | Admitting: Radiation Oncology

## 2019-11-01 DIAGNOSIS — C50212 Malignant neoplasm of upper-inner quadrant of left female breast: Secondary | ICD-10-CM | POA: Diagnosis not present

## 2019-11-02 ENCOUNTER — Other Ambulatory Visit: Payer: Self-pay

## 2019-11-02 ENCOUNTER — Ambulatory Visit
Admission: RE | Admit: 2019-11-02 | Discharge: 2019-11-02 | Disposition: A | Discharge status: Home / Self Care | Payer: Medicaid Other | Source: Ambulatory Visit | Attending: Radiation Oncology | Admitting: Radiation Oncology

## 2019-11-02 DIAGNOSIS — C50212 Malignant neoplasm of upper-inner quadrant of left female breast: Secondary | ICD-10-CM | POA: Diagnosis not present

## 2019-11-03 ENCOUNTER — Other Ambulatory Visit: Payer: Self-pay

## 2019-11-03 ENCOUNTER — Ambulatory Visit
Admission: RE | Admit: 2019-11-03 | Discharge: 2019-11-03 | Disposition: A | Discharge status: Home / Self Care | Payer: Medicaid Other | Source: Ambulatory Visit | Attending: Radiation Oncology | Admitting: Radiation Oncology

## 2019-11-03 DIAGNOSIS — C50212 Malignant neoplasm of upper-inner quadrant of left female breast: Secondary | ICD-10-CM | POA: Diagnosis not present

## 2019-11-04 ENCOUNTER — Ambulatory Visit
Admission: RE | Admit: 2019-11-04 | Discharge: 2019-11-04 | Disposition: A | Discharge status: Home / Self Care | Payer: Medicaid Other | Source: Ambulatory Visit | Attending: Radiation Oncology | Admitting: Radiation Oncology

## 2019-11-04 ENCOUNTER — Other Ambulatory Visit: Payer: Self-pay

## 2019-11-04 DIAGNOSIS — C50212 Malignant neoplasm of upper-inner quadrant of left female breast: Secondary | ICD-10-CM | POA: Diagnosis not present

## 2019-11-07 ENCOUNTER — Ambulatory Visit
Admission: RE | Admit: 2019-11-07 | Discharge: 2019-11-07 | Disposition: A | Discharge status: Home / Self Care | Payer: Medicaid Other | Source: Ambulatory Visit | Attending: Radiation Oncology | Admitting: Radiation Oncology

## 2019-11-07 ENCOUNTER — Other Ambulatory Visit: Payer: Self-pay

## 2019-11-07 DIAGNOSIS — C50212 Malignant neoplasm of upper-inner quadrant of left female breast: Secondary | ICD-10-CM | POA: Diagnosis not present

## 2019-11-08 ENCOUNTER — Ambulatory Visit
Admission: RE | Admit: 2019-11-08 | Discharge: 2019-11-08 | Disposition: A | Discharge status: Home / Self Care | Payer: Medicaid Other | Source: Ambulatory Visit | Attending: Radiation Oncology | Admitting: Radiation Oncology

## 2019-11-08 ENCOUNTER — Other Ambulatory Visit: Payer: Self-pay

## 2019-11-08 DIAGNOSIS — C50212 Malignant neoplasm of upper-inner quadrant of left female breast: Secondary | ICD-10-CM | POA: Diagnosis not present

## 2019-11-09 ENCOUNTER — Ambulatory Visit
Admission: RE | Admit: 2019-11-09 | Discharge: 2019-11-09 | Disposition: A | Discharge status: Home / Self Care | Payer: Medicaid Other | Source: Ambulatory Visit | Attending: Radiation Oncology | Admitting: Radiation Oncology

## 2019-11-09 ENCOUNTER — Other Ambulatory Visit: Payer: Self-pay

## 2019-11-09 DIAGNOSIS — C50212 Malignant neoplasm of upper-inner quadrant of left female breast: Secondary | ICD-10-CM | POA: Diagnosis not present

## 2019-11-10 ENCOUNTER — Other Ambulatory Visit: Payer: Self-pay

## 2019-11-10 ENCOUNTER — Ambulatory Visit
Admission: RE | Admit: 2019-11-10 | Discharge: 2019-11-10 | Disposition: A | Discharge status: Home / Self Care | Payer: Medicaid Other | Source: Ambulatory Visit | Attending: Radiation Oncology | Admitting: Radiation Oncology

## 2019-11-10 DIAGNOSIS — C50212 Malignant neoplasm of upper-inner quadrant of left female breast: Secondary | ICD-10-CM | POA: Diagnosis not present

## 2019-11-11 ENCOUNTER — Ambulatory Visit
Admission: RE | Admit: 2019-11-11 | Discharge: 2019-11-11 | Disposition: A | Discharge status: Home / Self Care | Payer: Medicaid Other | Source: Ambulatory Visit | Attending: Radiation Oncology | Admitting: Radiation Oncology

## 2019-11-11 ENCOUNTER — Other Ambulatory Visit: Payer: Self-pay

## 2019-11-11 DIAGNOSIS — C50212 Malignant neoplasm of upper-inner quadrant of left female breast: Secondary | ICD-10-CM | POA: Diagnosis not present

## 2019-11-13 NOTE — Progress Notes (Signed)
Patient Care Team: Tawny Asal as PCP - General (Physician Assistant) Mauro Kaufmann, RN as Oncology Nurse Navigator Rockwell Germany, RN as Oncology Nurse Navigator Alphonsa Overall, MD as Consulting Physician (General Surgery) Nicholas Lose, MD as Consulting Physician (Hematology and Oncology) Gery Pray, MD as Consulting Physician (Radiation Oncology)  DIAGNOSIS:    ICD-10-CM   1. Malignant neoplasm of upper-inner quadrant of left breast in female, estrogen receptor positive ([place])  C50.212    Z17.0     SUMMARY OF ONCOLOGIC HISTORY: Oncology History  Malignant neoplasm of upper-inner quadrant of left breast in female, estrogen receptor positive ([place])  02/07/2019 Initial Diagnosis   Diagnostic mammogram following palpable lump in the left breast x6 months showed 3.0cm mass at the 9 oc'clock position 2cm from the nipple. Biopsy confirmed grade 3 IDC, HER2 negative by FISH, ER+ (100%), PR+ (80%), Ki67 20%.    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    03/29/2019 Cancer Staging   Staging form: Breast, AJCC 8th Edition - Pathologic stage from 03/29/2019: Stage IIA (pT2, pN1a, cM0, G3, ER+, PR+, HER2-) - Signed by Nicholas Lose, MD on 03/29/2019   04/26/2019 -  Chemotherapy   Adj chemo with AC-T   09/28/2019 -  Radiation Therapy   Adjuvant radiation therapy     CHIEF COMPLIANT: Follow-up to discuss antiestrogen therapy  INTERVAL HISTORY: Linda Spears is a 59 y.o. with above-mentioned history of breast cancer who underwent a lumpectomy, adjuvant chemotherapy, and is currently on radiation therapy.She is a participant in the neuropathy  clinical trial.She presents to the clinic todayto discuss antiestrogen therapy.  She has tolerated radiation therapy fairly well with exception of redness.  She continues to have mild peripheral neuropathy of the tips of the fingers and toes.  ALLERGIES:  has No Known Allergies.  MEDICATIONS:  Current Outpatient Medications  Medication Sig Dispense Refill  . acetaminophen (TYLENOL) 500 MG tablet Take 1,000 mg by mouth every 6 (six) hours as needed for moderate pain.    Marland Kitchen lidocaine-prilocaine (EMLA) cream Apply to affected area once 30 g 3   No current facility-administered medications for this visit.   Facility-Administered Medications Ordered in Other Visits  Medication Dose Route Frequency Provider Last Rate Last Admin  . pegfilgrastim (NEULASTA) injection 6 mg  6 mg Subcutaneous Once Nicholas Lose, MD        PHYSICAL EXAMINATION: ECOG PERFORMANCE STATUS: 1 - Symptomatic but completely ambulatory  Vitals:   11/14/19 0935  BP: 117/77  Pulse: 70  Resp: 18  Temp: 98.2 F (36.8 C)  SpO2: 100%   Filed Weights   11/14/19 0935  Weight: 127 lb 14.4 oz (58 kg)    LABORATORY DATA:  I have reviewed the data as listed CMP Latest Ref Rng & Units 09/06/2019 08/30/2019 08/23/2019  Glucose 70 - 99 mg/dL 107(H) 108(H) 104(H)  BUN 6 - 20 mg/dL 9 10 8   Creatinine 0.44 - 1.00 mg/dL 0.77 0.65 0.74  Sodium 135 - 145 mmol/L 140 138 139  Potassium 3.5 - 5.1 mmol/L 4.1 3.9 4.4  Chloride 98 - 111 mmol/L 106 106 107  CO2 22 - 32 mmol/L 24  25 24  Calcium 8.9 - 10.3 mg/dL 8.9 8.9 8.6(L)  Total Protein 6.5 - 8.1 g/dL 6.8 6.6 6.8  Total Bilirubin 0.3 - 1.2 mg/dL <0.2(L) 0.8 <0.2(L)  Alkaline Phos 38 - 126 U/L 64 54 67  AST 15 - 41 U/L 16 20 18   ALT 0 - 44 U/L 16 20 17     Lab Results  Component Value Date   WBC 6.1 09/06/2019   HGB 10.7 (L) 09/06/2019   HCT 33.6 (L) 09/06/2019   MCV 92.6 09/06/2019   PLT 310 09/06/2019   NEUTROABS 3.6 09/06/2019    ASSESSMENT & PLAN:  Malignant  neoplasm of upper-inner quadrant of left breast in female, estrogen receptor positive ([place]) 03/14/2019:Left lumpectomy [name]): IDC, 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Adjuvant chemotherapy with dose dense Adriamycin and Cytoxan x4 followed by Taxol x12 completed 09/06/2019 2.Followed by adjuvant radiation 09/28/2019-11/14/2019 3.Followed by adjuvant antiestrogen therapy No adverse effects due to participation in the upbeat clinical trial ----------------------------------------------------------- Anastrozole counseling: We discussed the risks and benefits of anti-estrogen therapy with aromatase inhibitors. These include but not limited to insomnia, hot flashes, mood changes, vaginal dryness, bone density loss, and weight gain. We strongly believe that the benefits far outweigh the risks. Patient understands these risks and consented to starting treatment. Planned treatment duration is 7 years.  Start anastrozole in 2 weeks. Patient consented to the antiestrogen therapy adherence patient reported outcomes study.  Chemo-induced peripheral neuropathy: Mild symptoms.  Return to clinic in 3 months for survivorship care plan visit   No orders of the defined types were placed in this encounter.  The patient has a good understanding of the overall plan. she agrees with it. she will call with any problems that may develop before the next visit here.  Total time spent: 30 mins including face to face time and time spent for planning, charting and coordination of care  Nicholas Lose, MD 11/14/2019  I, Cloyde Reams Dorshimer, am acting as scribe for Dr. Nicholas Lose.  I have reviewed the above documentation for accuracy and completeness, and I agree with the above.

## 2019-11-14 ENCOUNTER — Encounter: Payer: Self-pay | Admitting: Medical Oncology

## 2019-11-14 ENCOUNTER — Encounter: Payer: Self-pay | Admitting: *Deleted

## 2019-11-14 ENCOUNTER — Ambulatory Visit: Payer: Medicaid Other

## 2019-11-14 ENCOUNTER — Ambulatory Visit
Admission: RE | Admit: 2019-11-14 | Discharge: 2019-11-14 | Disposition: A | Discharge status: Home / Self Care | Payer: Medicaid Other | Source: Ambulatory Visit | Attending: Radiation Oncology | Admitting: Radiation Oncology

## 2019-11-14 ENCOUNTER — Inpatient Hospital Stay: Payer: Medicaid Other | Attending: Hematology and Oncology | Admitting: Hematology and Oncology

## 2019-11-14 ENCOUNTER — Other Ambulatory Visit: Payer: Self-pay

## 2019-11-14 ENCOUNTER — Inpatient Hospital Stay: Payer: Medicaid Other | Admitting: Medical Oncology

## 2019-11-14 DIAGNOSIS — Z170 Estrogen receptor positive status [ER+]: Secondary | ICD-10-CM

## 2019-11-14 DIAGNOSIS — G620 Drug-induced polyneuropathy: Secondary | ICD-10-CM | POA: Diagnosis not present

## 2019-11-14 DIAGNOSIS — C50212 Malignant neoplasm of upper-inner quadrant of left female breast: Secondary | ICD-10-CM

## 2019-11-14 DIAGNOSIS — Z79811 Long term (current) use of aromatase inhibitors: Secondary | ICD-10-CM | POA: Diagnosis not present

## 2019-11-14 DIAGNOSIS — Z006 Encounter for examination for normal comparison and control in clinical research program: Secondary | ICD-10-CM

## 2019-11-14 DIAGNOSIS — T451X5A Adverse effect of antineoplastic and immunosuppressive drugs, initial encounter: Secondary | ICD-10-CM | POA: Insufficient documentation

## 2019-11-14 MED ORDER — ANASTROZOLE 1 MG PO TABS: 1.0000 mg | ORAL_TABLET | Freq: Every day | ORAL | 3 refills | Status: AC

## 2019-11-14 NOTE — Assessment & Plan Note (Signed)
03/14/2019:Left lumpectomy [name]): IDC, 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Adjuvant chemotherapy with dose dense Adriamycin and Cytoxan x4 followed by Taxol x12 completed 09/06/2019 2.Followed by adjuvant radiation 09/28/2019-11/14/2019 3.Followed by adjuvant antiestrogen therapy No adverse effects due to participation in the upbeat clinical trial ----------------------------------------------------------- Anastrozole counseling: We discussed the risks and benefits of anti-estrogen therapy with aromatase inhibitors. These include but not limited to insomnia, hot flashes, mood changes, vaginal dryness, bone density loss, and weight gain. We strongly believe that the benefits far outweigh the risks. Patient understands these risks and consented to starting treatment. Planned treatment duration is 7 years.  Start anastrozole in 2 weeks. Return to clinic in 3 months for survivorship care plan visit

## 2019-11-14 NOTE — Research (Signed)
[id number], A PROSPECTIVE OBSERVATIONAL COHORT STUDY TO DEVELOP A PREDICTIVE MODEL OF TAXANE-INDUCED PERIPHERAL NEUROPATHY IN CANCER PATIENTS. 24 WEEKS VISIT: PROs; Questionnaires were given to patient to complete in clinic prior to lab appointment. Collected questionnaires and checked for completeness and accuracy.  History of Falls;  Patient denies having any falls. Labs: patient did not provided consent for optional labs. Solicited Neuropathy Events; Patient reports continued feeling of numbness and tingling in fingertips. It does not interfere with any functioning or ADLs, she does report some improvement. Patient also states today that she has some tingling to her toes. Grade 1 Paresthesia reported on CRF and Dr. Lindi Adie agrees and states this is related to Taxol.  Treatment; patient has completed chemotherapy treatment. Assessment for Interventions for CIPN; Reviewed with patient and CRFs completed. She reports continuting use of tea tree oil twice a day to her hands and fingertips to help the neuropathy symptoms.  Neuropen Assessment; Completed per protocol by this research RN with assistance timing and recording by Wilber Bihari, research RN.  T[place] Assessment; Completed per protocol by this research RN with assistance timing and recording by Wilber Bihari, research RN.  Timed Get Up and Go Test; Completed.   Physician Assessments; Treatment Burden form completed and signed by Dr. Lindi Adie today based on his assessment of seeing patient in clinic today. Plan; Informed patient of next study assessments, for 52 weeks,  planned between 05/23/20 - 06/19/20. Patient informed me today that she is planning to move to [place] sometime at the end of this summer. Patient states that she would like to continue with the study, if she can, when she moves. I informed patient that I will pass this information along to her research nurse, Foye Spurling, to call her and discuss if this is an option. I  informed patient that we would need to look into whether the study is open in a clinic near to where she plans on moving to. Patient gave her verbal understanding. Patient was thanked for her time today and her continued support of study.  She was encouraged to call Dr. Lindi Adie or Cameo with any questions or concerns she may have.  Maxwell Marion, RN, BSN, [facility] Clinical Research 11/14/2019 11:58 AM

## 2019-11-15 ENCOUNTER — Encounter: Payer: Self-pay | Admitting: Radiation Oncology

## 2019-11-15 ENCOUNTER — Ambulatory Visit
Admission: RE | Admit: 2019-11-15 | Discharge: 2019-11-15 | Disposition: A | Discharge status: Home / Self Care | Payer: Medicaid Other | Source: Ambulatory Visit | Attending: Radiation Oncology | Admitting: Radiation Oncology

## 2019-11-15 ENCOUNTER — Encounter: Payer: Self-pay | Admitting: *Deleted

## 2019-11-15 ENCOUNTER — Telehealth: Payer: Self-pay | Admitting: Hematology and Oncology

## 2019-11-15 ENCOUNTER — Other Ambulatory Visit: Payer: Self-pay

## 2019-11-15 DIAGNOSIS — C50212 Malignant neoplasm of upper-inner quadrant of left female breast: Secondary | ICD-10-CM | POA: Diagnosis not present

## 2019-11-15 NOTE — Telephone Encounter (Signed)
Scheduled per 03/22 los, patient has been called and notified. ?

## 2019-11-16 ENCOUNTER — Telehealth: Payer: Self-pay | Admitting: *Deleted

## 2019-11-16 ENCOUNTER — Telehealth: Payer: Self-pay | Admitting: Adult Health

## 2019-11-16 NOTE — Telephone Encounter (Signed)
Upbeat;  Patient notified research nurse that she plans to move to [place] at the end of the summer. Called patient to schedule 12 month Upbeat Study prior to patient moving.  She states she is moving the last week of September and agreed to study visit mid September on 05/09/20.  She is willing to stay on this study for 24 month visit if it is available to her in [place].  Informed patient research will find out after we know which cancer center she is going to transfer her care ot if this study is open at that site.  Thanked patient for her participation and willingness to continue on study.  Instructed her to call research if she needs to reschedule the appointment in September. Patient verbalized understanding.  Foye Spurling, BSN, RN Clinical Research Nurse 11/16/2019 10:58 AM

## 2019-11-16 NOTE — Telephone Encounter (Signed)
Called and [facility] for patient to return my call about the AET/my chart care companion study.    Wilber Bihari, NP

## 2019-11-16 NOTE — Telephone Encounter (Signed)
Called patient about AET/my chart care companion study.  She cannot find the to do list on her my chart app.  She will come here 3/31 at 10 am and we will review this.    Wilber Bihari, NP

## 2019-11-16 NOTE — Telephone Encounter (Signed)
[id number]; Patient planning to move to [place] at the end of September.  Next study visit for 52 weeks is due 06/19/20 +/- 28 days.  Date ranges for this visit are 05/22/20-07/17/20.  Asked patient if she can do study visit on 9/28 but she cannot as she plans to move the week before.  She agreed to study visit mid September if study allows.  She is already scheduled for the Upbeat study visit that day.  We will communicate with the study for guidance. Thanked patient for her willingness to continue on study and come in for study visit prior to moving.  Foye Spurling, BSN, RN Clinical Research Nurse 11/16/2019 11:11 AM

## 2019-11-23 ENCOUNTER — Other Ambulatory Visit: Payer: Self-pay

## 2019-11-23 ENCOUNTER — Inpatient Hospital Stay (HOSPITAL_BASED_OUTPATIENT_CLINIC_OR_DEPARTMENT_OTHER): Payer: Medicaid Other | Admitting: Adult Health

## 2019-11-23 VITALS — BP 126/79 | HR 64 | Temp 97.8°F | Resp 18 | Ht 62.0 in | Wt 129.1 lb

## 2019-11-23 DIAGNOSIS — C50212 Malignant neoplasm of upper-inner quadrant of left female breast: Secondary | ICD-10-CM

## 2019-11-23 DIAGNOSIS — Z170 Estrogen receptor positive status [ER+]: Secondary | ICD-10-CM

## 2019-11-23 NOTE — Progress Notes (Signed)
Linda Spears is here today for a visit related to AET my chart care companion adherence study.  I reviewed with her how to access care companion and recommended she download the app onto her phone so that she could participate.  I informed her the surveys are app based and do not work through Building control surveyor of my chart.  I showed her on my phone how my chart appears and where to go in the "to do" section to access surveys.  She plans on getting the my chart app downloaded this week and is due to start AET next week.  She will call us if she has any questions.   Wilber Bihari, NP  No charge for this visit.

## 2019-11-24 ENCOUNTER — Telehealth: Payer: Self-pay | Admitting: Adult Health

## 2019-11-24 NOTE — Telephone Encounter (Signed)
No 3/31 LOS. No changes made to patient's schedule.

## 2019-11-28 ENCOUNTER — Encounter (INDEPENDENT_AMBULATORY_CARE_PROVIDER_SITE_OTHER): Payer: Self-pay

## 2019-12-06 ENCOUNTER — Encounter (INDEPENDENT_AMBULATORY_CARE_PROVIDER_SITE_OTHER): Payer: Self-pay

## 2019-12-06 ENCOUNTER — Telehealth: Payer: Self-pay

## 2019-12-06 NOTE — Telephone Encounter (Signed)
TC to pt per Wilber Bihari NP to  call patient about her constipation. Recommend she drink plenty of water and use stool softeners such as colace if needed.      Patient verbalized understanding. No further problems or concerns at this time.

## 2019-12-13 ENCOUNTER — Encounter (INDEPENDENT_AMBULATORY_CARE_PROVIDER_SITE_OTHER): Payer: Self-pay

## 2019-12-14 NOTE — Progress Notes (Signed)
  Patient Name: Linda Spears MRN: [id number] DOB: 07-17-1961 Referring Physician: Domenica Fail Date of Service: 11/15/2019 [facility] [place], [place]                                                        End Of Treatment Note  Diagnoses: C50.212-Malignant neoplasm of upper-inner quadrant of left female breast  Cancer Staging: StageIIA(pT2, pN1a, cM0)LeftBreast UIQ,Invasive DuctalCarcinoma with DCIS, ER+/ PR+/ Her2-, Grade3  Intent: Curative  Radiation Treatment Dates: 09/28/2019 through 11/15/2019 Site Technique Total Dose (Gy) Dose per Fx (Gy) Completed Fx Beam Energies  Breast, Left: Breast_Lt_Bst 3D 12/12 2 6/6 6X  Axilla, Left: Axilla_Lt 3D 50.4/50.4 1.8 28/28 6X, 10X  Breast, Left: Breast_Lt 3D 50.4/50.4 1.8 28/28 6X   Narrative: The patient tolerated radiation therapy relatively well. She reported some mild fatigue that was relieved by napping and an occasional tingling sensation/pain in the left breast and axilla. Pain was relieved by APAP. She was noted to have some mild hyperpigmentation changes of the left breast with some slight swelling mid way through treatment. As treatment continued, there was some swelling noted in the left lateral breast and left axilla with some hyperpigmentation. No skin breakdown was appreciated.  Plan: The patient will follow-up with radiation oncology in one month.  ________________________________________________   Blair Promise, PhD, MD  This document serves as a record of services personally performed by Gery Pray, MD. It was created on his behalf by Clerance Lav, a trained medical scribe. The creation of this record is based on the scribe's personal observations and the provider's statements to them. This document has been checked and approved by the attending  provider.

## 2019-12-19 ENCOUNTER — Ambulatory Visit
Admission: RE | Admit: 2019-12-19 | Discharge: 2019-12-19 | Disposition: A | Discharge status: Home / Self Care | Payer: Medicaid Other | Source: Ambulatory Visit | Attending: Radiation Oncology | Admitting: Radiation Oncology

## 2019-12-19 ENCOUNTER — Encounter: Payer: Self-pay | Admitting: Radiation Oncology

## 2019-12-19 ENCOUNTER — Other Ambulatory Visit: Payer: Self-pay

## 2019-12-19 VITALS — BP 132/78 | HR 56 | Temp 98.2°F | Resp 18 | Ht 62.0 in | Wt 131.2 lb

## 2019-12-19 DIAGNOSIS — C50212 Malignant neoplasm of upper-inner quadrant of left female breast: Secondary | ICD-10-CM

## 2019-12-19 NOTE — Progress Notes (Signed)
Radiation Oncology         [phone] ________________________________  Name: Linda Spears MRN: [id number]  Date: 12/19/2019  DOB: 12/18/1960  Follow-Up Visit Note  CC: Clent Demark, PA-C  Clent Demark, PA-C    ICD-10-CM   1. Malignant neoplasm of upper-inner quadrant of left breast in female, estrogen receptor positive ([place])  C50.212    Z17.0     Diagnosis: StageIIA(pT2, pN1a, cM0)LeftBreast UIQ,Invasive DuctalCarcinoma with DCIS, ER+/ PR+/ Her2-, Grade3  Interval Since Last Radiation: One month and three days.  Radiation Treatment Dates: 09/28/2019 through 11/15/2019 Site Technique Total Dose (Gy) Dose per Fx (Gy) Completed Fx Beam Energies  Breast, Left: Breast_Lt_Bst 3D 12/12 2 6/6 6X  Axilla, Left: Axilla_Lt 3D 50.4/50.4 1.8 28/28 6X, 10X  Breast, Left: Breast_Lt 3D 50.4/50.4 1.8 28/28 6X    Narrative:  The patient returns today for routine follow-up. No significant interval history since the end of treatment. Of note, the patient plans to move to [place] at the end of September.  On review of systems, she reports intermittent pain within the left breast which is controlled with Tylenol or Advil. She denies nipple discharge or bleeding.  She has some sensitivity to the skin of the lateral breast region.  She denies any problems with swelling in her left arm or hand  She did start Arimidex recently and seems to be tolerating this medication well.  ALLERGIES:  has No Known Allergies.  Meds: Current Outpatient Medications  Medication Sig Dispense Refill  . acetaminophen (TYLENOL) 500 MG tablet Take 1,000 mg by mouth every 6 (six) hours as needed for moderate pain.    Marland Kitchen anastrozole (ARIMIDEX) 1 MG tablet Take 1 tablet (1 mg total) by mouth daily. 90 tablet 3  . lidocaine-prilocaine (EMLA) cream Apply to affected area once 30 g 3   No current facility-administered medications for this encounter.   Facility-Administered Medications Ordered in Other  Encounters  Medication Dose Route Frequency Provider Last Rate Last Admin  . pegfilgrastim (NEULASTA) injection 6 mg  6 mg Subcutaneous Once Nicholas Lose, MD        Physical Findings: The patient is in no acute distress. Patient is alert and oriented.  height is 5' 2"  (1.575 m) and weight is 131 lb 4 oz (59.5 kg). Her temporal temperature is 98.2 F (36.8 C). Her blood pressure is 132/78 and her pulse is 56 (abnormal). Her respiration is 18 and oxygen saturation is 100%.  No significant changes. Lungs are clear to auscultation bilaterally. Heart has regular rate and rhythm. No palpable cervical, supraclavicular, or axillary adenopathy. Abdomen soft, non-tender, normal bowel sounds. Right breast: No palpable mass, nipple discharge, or bleeding.  Left breast: Hyperpigmentation changes noted.  The patient skin is well-healed.  Mild edema in the nipple areolar complex area.  Some induration near her lumpectomy scar with no suspicious mass within the breast  Lab Findings: Lab Results  Component Value Date   WBC 6.1 09/06/2019   HGB 10.7 (L) 09/06/2019   HCT 33.6 (L) 09/06/2019   MCV 92.6 09/06/2019   PLT 310 09/06/2019    Radiographic Findings: No results found.  Impression: StageIIA(pT2, pN1a, cM0)LeftBreast UIQ,Invasive DuctalCarcinoma with DCIS, ER+/ PR+/ Her2-, Grade3  The patient is recovering from the effects of radiation.  Skin is well-healed.  No evidence of recurrence on clinical exam  Plan:  The patient is scheduled to follow-up with Wilber Bihari, NP, on 02/03/2020.  As needed follow-up in radiation oncology.  Patient will  continue on Arimidex as above.  ____________________________________   Blair Promise, PhD, MD  This document serves as a record of services personally performed by Gery Pray, MD. It was created on his behalf by Clerance Lav, a trained medical scribe. The creation of this record is based on the scribe's personal observations and the  provider's statements to them. This document has been checked and approved by the attending provider.

## 2019-12-19 NOTE — Progress Notes (Signed)
One month follow up patient reports some Intermittent pain to left breast and very sensitive to touch. Skin intact using sonafine and Vitamin E oil to skin denies any fatigue. Started on Estrogen recently.   Blood pressure 132/78, pulse (!) 56, temperature 98.2 F (36.8 C), temperature source Temporal, resp. rate 18, height 5\' 2"  (1.575 m), weight 131 lb 4 oz (59.5 kg), SpO2 100 %.

## 2019-12-20 ENCOUNTER — Encounter (INDEPENDENT_AMBULATORY_CARE_PROVIDER_SITE_OTHER): Payer: Self-pay

## 2019-12-27 ENCOUNTER — Encounter (INDEPENDENT_AMBULATORY_CARE_PROVIDER_SITE_OTHER): Payer: Self-pay

## 2019-12-29 ENCOUNTER — Telehealth: Payer: Self-pay | Admitting: *Deleted

## 2019-12-29 ENCOUNTER — Other Ambulatory Visit: Payer: Self-pay

## 2019-12-29 DIAGNOSIS — C50212 Malignant neoplasm of upper-inner quadrant of left female breast: Secondary | ICD-10-CM

## 2019-12-29 DIAGNOSIS — Z170 Estrogen receptor positive status [ER+]: Secondary | ICD-10-CM

## 2019-12-29 DIAGNOSIS — I5189 Other ill-defined heart diseases: Secondary | ICD-10-CM

## 2019-12-29 NOTE — Telephone Encounter (Signed)
Research nurse received the Cardiac MRI results from the [facility].  This MRI was completed on 07/15/2019 and is intended for study purposes only. The findings on the report read "Abnormal" regarding the LV Wall Motion with a comment, "Mild Global Hypokinesis."  Global LV Function also had the following comment, "Suspect reduction of LVEF to 45% to 50%."   These results were shared with Dr. Lindi Adie and he ordered a Cardio-oncology referral.  Research nurse called patient this morning and informed of the Cardiac MRI results which warrant further investigation by Cardiologist.  Informed patient that Dr. Geralyn Flash nurse will make the referral and to expect a call for an appointment with Cardiologist.  I will mail a copy of the study cardiac MRI report to patient and give a copy to Dr. Geralyn Flash nurse to send to Cardiologist.  Informed patient that research nurse will follow up with her regarding study visits on her next visit in clinic on 02/03/20.  Patient states she is still planning on moving from [place] but it may be sooner than the end of September as originally planned.  Patient is now planning on moving to [place], [place] in August. She will ask for a referral to Oncology in [place] when she sees [place] in June.  Thanked patient for her time and encouraged her to call Dr. Geralyn Flash nurse if any questions regarding the Cardiology referral. She verbalized understanding.  Foye Spurling, BSN, RN Clinical Research Nurse 12/29/2019 9:48 AM

## 2019-12-29 NOTE — Progress Notes (Signed)
Referral placed to cardiology per MD recommendations for results of research cardiac MRI results showing LV Wall Motion of mild global hypokinesis.    RN faxed study results to [phone]

## 2020-01-03 ENCOUNTER — Telehealth (INDEPENDENT_AMBULATORY_CARE_PROVIDER_SITE_OTHER): Payer: Medicaid Other | Admitting: Primary Care

## 2020-01-03 DIAGNOSIS — F17200 Nicotine dependence, unspecified, uncomplicated: Secondary | ICD-10-CM

## 2020-01-03 DIAGNOSIS — Z170 Estrogen receptor positive status [ER+]: Secondary | ICD-10-CM

## 2020-01-03 DIAGNOSIS — C50212 Malignant neoplasm of upper-inner quadrant of left female breast: Secondary | ICD-10-CM | POA: Diagnosis not present

## 2020-01-03 DIAGNOSIS — Z1211 Encounter for screening for malignant neoplasm of colon: Secondary | ICD-10-CM | POA: Diagnosis not present

## 2020-01-03 NOTE — Progress Notes (Signed)
Virtual Visit via Telephone Note  I connected with Linda Spears on 01/03/20 at  1:30 PM EDT by telephone and verified that I am speaking with the correct person using two identifiers.   I discussed the limitations, risks, security and privacy concerns of performing an evaluation and management service by telephone and the availability of in person appointments. I also discussed with the patient that there may be a patient responsible charge related to this service. The patient expressed understanding and agreed to proceed.   History of Present Illness: Ms.Linda Spears is a 59 year old African female having a video visit. Patient is a breast cancer survivor . She was diagnosised June 2020 with breast cancer stage 2. She has a left lymphadenectomy and has completed treatment chemo and radiation. Discussed SBE and mammogram yearly. Establishing care with new provider.     Past Medical History:  Diagnosis Date  . Cancer [facility])    left breast   Current Outpatient Medications on File Prior to Visit  Medication Sig Dispense Refill  . acetaminophen (TYLENOL) 500 MG tablet Take 1,000 mg by mouth every 6 (six) hours as needed for moderate pain.    Marland Kitchen anastrozole (ARIMIDEX) 1 MG tablet Take 1 tablet (1 mg total) by mouth daily. 90 tablet 3  . lidocaine-prilocaine (EMLA) cream Apply to affected area once 30 g 3   Current Facility-Administered Medications on File Prior to Visit  Medication Dose Route Frequency Provider Last Rate Last Admin  . pegfilgrastim (NEULASTA) injection 6 mg  6 mg Subcutaneous Once Nicholas Lose, MD       Observations/Objective: Review of Systems  All other systems reviewed and are negative.  Assessment and Plan: Diagnoses and all orders for this visit:  Colon cancer screening -     Ambulatory referral to Gastroenterology  SMOKER She is aware of the increased risk for lung cancer and other respiratory diseases recommend cessation.  This will be reminded at each  clinical visit.  Malignant neoplasm of upper-inner quadrant of left breast in female, estrogen receptor positive ([place]) Followed by oncology    Follow Up Instructions:    I discussed the assessment and treatment plan with the patient. The patient was provided an opportunity to ask questions and all were answered. The patient agreed with the plan and demonstrated an understanding of the instructions.   The patient was advised to call back or seek an in-person evaluation if the symptoms worsen or if the condition fails to improve as anticipated.  I provided 15 minutes of non-face-to-face time during this encounter. Reviewing previous encounter, treatment , labs and imaging   Kerin Perna, NP

## 2020-01-04 ENCOUNTER — Encounter (INDEPENDENT_AMBULATORY_CARE_PROVIDER_SITE_OTHER): Payer: Self-pay

## 2020-01-04 ENCOUNTER — Ambulatory Visit: Payer: Medicaid Other | Admitting: Cardiology

## 2020-01-10 ENCOUNTER — Encounter (INDEPENDENT_AMBULATORY_CARE_PROVIDER_SITE_OTHER): Payer: Self-pay

## 2020-01-17 ENCOUNTER — Encounter (INDEPENDENT_AMBULATORY_CARE_PROVIDER_SITE_OTHER): Payer: Self-pay

## 2020-01-19 ENCOUNTER — Encounter (INDEPENDENT_AMBULATORY_CARE_PROVIDER_SITE_OTHER): Payer: Self-pay | Admitting: Primary Care

## 2020-01-19 ENCOUNTER — Other Ambulatory Visit (HOSPITAL_COMMUNITY)
Admission: RE | Admit: 2020-01-19 | Discharge: 2020-01-19 | Disposition: A | Discharge status: Home / Self Care | Payer: Medicaid Other | Source: Ambulatory Visit | Attending: Primary Care | Admitting: Primary Care

## 2020-01-19 ENCOUNTER — Ambulatory Visit (INDEPENDENT_AMBULATORY_CARE_PROVIDER_SITE_OTHER): Payer: Medicaid Other | Admitting: Primary Care

## 2020-01-19 ENCOUNTER — Other Ambulatory Visit: Payer: Self-pay

## 2020-01-19 VITALS — BP 147/84 | HR 55 | Temp 97.3°F | Ht 62.0 in | Wt 131.4 lb

## 2020-01-19 DIAGNOSIS — Z113 Encounter for screening for infections with a predominantly sexual mode of transmission: Secondary | ICD-10-CM | POA: Insufficient documentation

## 2020-01-19 DIAGNOSIS — Z1211 Encounter for screening for malignant neoplasm of colon: Secondary | ICD-10-CM | POA: Diagnosis not present

## 2020-01-19 DIAGNOSIS — Z124 Encounter for screening for malignant neoplasm of cervix: Secondary | ICD-10-CM

## 2020-01-19 NOTE — Patient Instructions (Signed)

## 2020-01-19 NOTE — Progress Notes (Signed)
Established Patient Office Visit  Subjective:  Patient ID: Linda Spears, female    DOB: 05-19-1961  Age: 59 y.o. MRN: [id number]  CC:  Chief Complaint  Patient presents with  . Gynecologic Exam    HPI Ms. [name] presents for wellness gyn physical  Past Medical History:  Diagnosis Date  . Cancer [facility])    left breast    Past Surgical History:  Procedure Laterality Date  . BREAST LUMPECTOMY WITH RADIOACTIVE SEED AND SENTINEL LYMPH NODE BIOPSY Left 03/22/2019   Procedure: LEFT BREAST LUMPECTOMY X2 WITH RADIOACTIVE SEED AND LEFT AXILLARY SENTINEL LYMPH NODE BIOPSY;  Surgeon: Alphonsa Overall, MD;  Location: [place];  Service: General;  Laterality: Left;  . PORTACATH PLACEMENT N/A 04/19/2019   Procedure: INSERTION PORT-A-CATH WITH ULTRASOUND;  Surgeon: Alphonsa Overall, MD;  [facility];  Service: General;  Laterality: N/A;  . TONSILLECTOMY      Family History  Problem Relation Age of Onset  . Hypertension Mother   . Migraines Mother   . Breast cancer Sister 71    Social History   Socioeconomic History  . Marital status: Single    Spouse name: Not on file  . Number of children: 3  . Years of education: Not on file  . Highest education level: Bachelor's degree (e.g., BA, AB, BS)  Occupational History  . Not on file  Tobacco Use  . Smoking status: Current Every Day Smoker    Packs/day: 0.25    Types: Cigarettes  . Smokeless tobacco: Never Used  Substance and Sexual Activity  . Alcohol use: Yes    Comment: social  . Drug use: No  . Sexual activity: Not Currently  Other Topics Concern  . Not on file  Social History Narrative  . Not on file   Social Determinants of Health   Financial Resource Strain:   . Difficulty of Paying Living Expenses:   Food Insecurity:   . Worried About Charity fundraiser in the Last Year:   . Arboriculturist in the Last Year:   Transportation Needs: Unmet Transportation Needs  . Lack of Transportation (Medical): Yes  .  Lack of Transportation (Non-Medical): Yes  Physical Activity:   . Days of Exercise per Week:   . Minutes of Exercise per Session:   Stress:   . Feeling of Stress :   Social Connections:   . Frequency of Communication with Friends and Family:   . Frequency of Social Gatherings with Friends and Family:   . Attends Religious Services:   . Active Member of Clubs or Organizations:   . Attends Archivist Meetings:   Marland Kitchen Marital Status:   Intimate Partner Violence:   . Fear of Current or Ex-Partner:   . Emotionally Abused:   Marland Kitchen Physically Abused:   . Sexually Abused:     Outpatient Medications Prior to Visit  Medication Sig Dispense Refill  . acetaminophen (TYLENOL) 500 MG tablet Take 1,000 mg by mouth every 6 (six) hours as needed for moderate pain.    Marland Kitchen anastrozole (ARIMIDEX) 1 MG tablet Take 1 tablet (1 mg total) by mouth daily. 90 tablet 3  . lidocaine-prilocaine (EMLA) cream Apply to affected area once 30 g 3   Facility-Administered Medications Prior to Visit  Medication Dose Route Frequency Provider Last Rate Last Admin  . pegfilgrastim (NEULASTA) injection 6 mg  6 mg Subcutaneous Once Nicholas Lose, MD        No Known Allergies  ROS Review of Systems    Objective:    Physical Exam CONSTITUTIONAL: Well-developed, well-nourished female in no acute distress.  HENT:  Normocephalic, atraumatic, External right and left ear normal. Oropharynx is clear and moist EYES: Conjunctivae and EOM are normal. Pupils are equal, round, and reactive to light. No scleral icterus.  NECK: Normal range of motion, supple, no masses.  Normal thyroid.  SKIN: Skin is warm and dry. No rash noted. Not diaphoretic. No erythema. No pallor. [place]: Alert and oriented to person, place, and time. Normal reflexes, muscle tone coordination. No cranial nerve deficit noted. PSYCHIATRIC: Normal mood and affect. Normal behavior. Normal judgment and thought content. CARDIOVASCULAR: Normal heart rate  noted, regular rhythm RESPIRATORY: Clear to auscultation bilaterally. Effort and breath sounds normal, no problems with respiration noted. BREASTS:deferred  ABDOMEN: Soft, normal bowel sounds, no distention noted.  No tenderness, rebound or guarding.  PELVIC: Normal appearing external genitalia; normal appearing vaginal mucosa and cervix.  No abnormal discharge noted.  Pap smear obtained.  Normal uterine size, no other palpable masses, no uterine or adnexal tenderness. MUSCULOSKELETAL: Normal range of motion. No tenderness.  No cyanosis, clubbing, or edema.  2+ distal pulses. BP (!) 147/84 (BP Location: Right Arm, Patient Position: Sitting, Cuff Size: Normal)   Pulse (!) 55   Temp (!) 97.3 F (36.3 C) (Temporal)   Ht 5\' 2"  (1.575 m)   Wt 131 lb 6.4 oz (59.6 kg)   SpO2 99%   BMI 24.03 kg/m  Wt Readings from Last 3 Encounters:  01/19/20 131 lb 6.4 oz (59.6 kg)  12/19/19 131 lb 4 oz (59.5 kg)  11/23/19 129 lb 1.6 oz (58.6 kg)     Health Maintenance Due  Topic Date Due  . COVID-19 Vaccine (1) Never done  . MAMMOGRAM  Never done  . COLONOSCOPY  Never done  . PAP SMEAR-Modifier  12/05/2019    There are no preventive care reminders to display for this patient.  Lab Results  Component Value Date   TSH 0.528 11/13/2016   Lab Results  Component Value Date   WBC 6.1 09/06/2019   HGB 10.7 (L) 09/06/2019   HCT 33.6 (L) 09/06/2019   MCV 92.6 09/06/2019   PLT 310 09/06/2019   Lab Results  Component Value Date   NA 140 09/06/2019   K 4.1 09/06/2019   CO2 24 09/06/2019   GLUCOSE 107 (H) 09/06/2019   BUN 9 09/06/2019   CREATININE 0.77 09/06/2019   BILITOT <0.2 (L) 09/06/2019   ALKPHOS 64 09/06/2019   AST 16 09/06/2019   ALT 16 09/06/2019   PROT 6.8 09/06/2019   ALBUMIN 3.9 09/06/2019   CALCIUM 8.9 09/06/2019   ANIONGAP 10 09/06/2019   Lab Results  Component Value Date   CHOL 228 (H) 11/13/2016   Lab Results  Component Value Date   HDL 62 11/13/2016   Lab Results   Component Value Date   LDLCALC 136 (H) 11/13/2016   Lab Results  Component Value Date   TRIG 148 11/13/2016   Lab Results  Component Value Date   CHOLHDL 3.7 11/13/2016   No results found for: HGBA1C    Assessment & Plan:  Remya was seen today for gynecologic exam.  Diagnoses and all orders for this visit: Shuntavia was seen today for gynecologic exam.  Diagnoses and all orders for this visit:  Cervical cancer screening -     Cytology - PAP  Colon cancer screening Normal colon cancer screening.  CDC recommends colorectal screening  from ages 16-75. Screening can begin at 52  .(USPSTF) -     Ambulatory referral to Gastroenterology  Screening for STD (sexually transmitted disease) -     Cervicovaginal ancillary only    No orders of the defined types were placed in this encounter.   Follow-up: No follow-ups on file.    Kerin Perna, NP

## 2020-01-20 LAB — CERVICOVAGINAL ANCILLARY ONLY
Bacterial Vaginitis (gardnerella): POSITIVE — AB
Candida Glabrata: NEGATIVE
Candida Vaginitis: NEGATIVE
Chlamydia: NEGATIVE
Comment: NEGATIVE
Comment: NEGATIVE
Comment: NEGATIVE
Comment: NEGATIVE
Comment: NEGATIVE
Comment: NORMAL
Neisseria Gonorrhea: NEGATIVE
Trichomonas: NEGATIVE

## 2020-01-20 LAB — CYTOLOGY - PAP: Diagnosis: NEGATIVE

## 2020-01-24 ENCOUNTER — Encounter (INDEPENDENT_AMBULATORY_CARE_PROVIDER_SITE_OTHER): Payer: Self-pay

## 2020-01-26 ENCOUNTER — Encounter: Payer: Self-pay | Admitting: Cardiology

## 2020-01-26 ENCOUNTER — Other Ambulatory Visit: Payer: Self-pay

## 2020-01-26 ENCOUNTER — Other Ambulatory Visit: Payer: Self-pay | Admitting: *Deleted

## 2020-01-26 ENCOUNTER — Telehealth: Payer: Self-pay | Admitting: *Deleted

## 2020-01-26 ENCOUNTER — Ambulatory Visit: Payer: Medicaid Other | Admitting: Cardiology

## 2020-01-26 ENCOUNTER — Other Ambulatory Visit (INDEPENDENT_AMBULATORY_CARE_PROVIDER_SITE_OTHER): Payer: Self-pay | Admitting: Primary Care

## 2020-01-26 VITALS — BP 162/84 | HR 46 | Ht 62.0 in | Wt 132.8 lb

## 2020-01-26 DIAGNOSIS — R0602 Shortness of breath: Secondary | ICD-10-CM | POA: Diagnosis not present

## 2020-01-26 DIAGNOSIS — Z7189 Other specified counseling: Secondary | ICD-10-CM

## 2020-01-26 DIAGNOSIS — F17200 Nicotine dependence, unspecified, uncomplicated: Secondary | ICD-10-CM

## 2020-01-26 DIAGNOSIS — Z170 Estrogen receptor positive status [ER+]: Secondary | ICD-10-CM

## 2020-01-26 DIAGNOSIS — Z79899 Other long term (current) drug therapy: Secondary | ICD-10-CM

## 2020-01-26 DIAGNOSIS — Z9221 Personal history of antineoplastic chemotherapy: Secondary | ICD-10-CM | POA: Diagnosis not present

## 2020-01-26 DIAGNOSIS — Z716 Tobacco abuse counseling: Secondary | ICD-10-CM | POA: Diagnosis not present

## 2020-01-26 DIAGNOSIS — C50212 Malignant neoplasm of upper-inner quadrant of left female breast: Secondary | ICD-10-CM | POA: Diagnosis not present

## 2020-01-26 DIAGNOSIS — B9689 Other specified bacterial agents as the cause of diseases classified elsewhere: Secondary | ICD-10-CM

## 2020-01-26 MED ORDER — METRONIDAZOLE 500 MG PO TABS: 500.0000 mg | ORAL_TABLET | Freq: Two times a day (BID) | ORAL | 0 refills | Status: DC

## 2020-01-26 NOTE — Telephone Encounter (Signed)
Patient states saw Cardiologist today and has instructions to get a lipid panel checked. Patient asks if a lab appointment can be added here on 6/11 when she comes for her appointment to see Wilber Bihari. Patient understands she will need to fast prior to this lab appointment.  Informed patient I will send message to Dr. Lindi Adie and Wilber Bihari to ask if ok to add lab for lipid panel.   She verbalized understanding.  Foye Spurling, BSN, RN Clinical Research Nurse 01/26/2020 10:14 AM

## 2020-01-26 NOTE — Progress Notes (Signed)
Cardiology Office Note:    Date:  01/26/2020   ID:  Linda Spears, DOB [birth year], MRN [id number]  PCP:  Linda Perna, NP  Cardiologist:  Linda Dresser, MD  Referring MD: Linda Lose, MD   CC: new consult for evaluation of abnormal echo  History of Present Illness:    Linda Spears is a 59 y.o. female with a hx of breast cancer who is seen as a new consult at the request of Linda Lose, MD for the evaluation and management of abnormal echocardiogram.  Per referral from Dr. Lindi Spears on 12/29/19, patient referred given history of breast cancer and abnormal echocardiogram. On review from echo 04/15/19, EF 50-55%, normal GLS at -9.1, but noted inferior basal hypokinesis.  She does not report any new issues. Was told her LV was abnormal. No specific new symptoms. 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  Cardiovascular risk factors: Prior clinical ASCVD: none Comorbid conditions: Denies hypertension, hyperlipidemia, diabetes, chronic kidney disease Metabolic syndrome/Obesity: BMI 24 Chronic inflammatory conditions: none Tobacco use history: current, trying to quit. At one pack/week. In the [employer] program, working for full cessation Family history: brother had a small stroke. Prior cardiac testing and/or incidental findings on other testing (ie coronary calcium): echo as below Exercise level: Walks several days/week while at home. Even more active when she is at work ([employer]) Current diet: varied diet, lots of fruits/vegetables, variety of protein. Fried food at most twice/week.  Notes that since radiation, she has brief shortness of breath when she is getting ready to do an activity. Does not have shortness of breath during activity, and no limitations with activity. Denies chest pain. No PND, orthopnea, LE edema or unexpected weight gain. No syncope or palpitations.  Was actively in treatment at the time of her echo. Was in chemo and planned for surgery. Has  some lingering neuropathy.  Past Medical History:  Diagnosis Date   Cancer [facility])    left breast    Past Surgical History:  Procedure Laterality Date   BREAST LUMPECTOMY WITH RADIOACTIVE SEED AND SENTINEL LYMPH NODE BIOPSY Left 03/22/2019   Procedure: LEFT BREAST LUMPECTOMY X2 WITH RADIOACTIVE SEED AND LEFT AXILLARY SENTINEL LYMPH NODE BIOPSY;  Surgeon: Alphonsa Overall, MD;  Location: [place];  Service: General;  Laterality: Left;   PORTACATH PLACEMENT N/A 04/19/2019   Procedure: INSERTION PORT-A-CATH WITH ULTRASOUND;  Surgeon: Alphonsa Overall, MD;  [facility];  Service: General;  Laterality: N/A;   TONSILLECTOMY      Current Medications: Current Outpatient Medications on File Prior to Visit  Medication Sig   acetaminophen (TYLENOL) 500 MG tablet Take 1,000 mg by mouth every 6 (six) hours as needed for moderate pain.   anastrozole (ARIMIDEX) 1 MG tablet Take 1 tablet (1 mg total) by mouth daily.   lidocaine-prilocaine (EMLA) cream Apply to affected area once   Current Facility-Administered Medications on File Prior to Visit  Medication   pegfilgrastim (NEULASTA) injection 6 mg     Allergies:   Patient has no known allergies.   Social History   Tobacco Use   Smoking status: Current Every Day Smoker    Packs/day: 0.25    Types: Cigarettes   Smokeless tobacco: Never Used  Substance Use Topics   Alcohol use: Yes    Comment: social   Drug use: No    Family History: family history includes Breast cancer (age of onset: 19) in her sister; Hypertension in her mother; Migraines in her mother.  ROS:  Please see the history of present illness.  Additional pertinent ROS: Constitutional: Negative for chills, fever, night sweats, unintentional weight loss  HENT: Negative for ear pain and hearing loss.   Eyes: Negative for loss of vision and eye pain.  Respiratory: Negative for cough, sputum, wheezing.   Cardiovascular: See HPI. Gastrointestinal: Negative for abdominal  pain, melena, and hematochezia.  Genitourinary: Negative for dysuria and hematuria.  Musculoskeletal: Negative for falls and myalgias.  Skin: Negative for itching and rash.  Neurological: Negative for focal weakness, focal sensory changes and loss of consciousness.  Endo/Heme/Allergies: Does not bruise/bleed easily.     EKGs/Labs/Other Studies Reviewed:    The following studies were reviewed today: Echo 04/15/19 1. The left ventricle has low normal systolic function, with an ejection  fraction of 50-55%. The cavity size was mildly dilated. Left ventricular  diastolic parameters were normal.  2. Inferior basal hypokinesis Normal GLS -19.1.  3. The right ventricle has normal systolic function. The cavity was  normal. There is no increase in right ventricular wall thickness.  4. Mild thickening of the mitral valve leaflet.  5. The aortic valve is tricuspid. Mild thickening of the aortic valve.  Mild calcification of the aortic valve.  6. The aorta is normal unless otherwise noted.   EKG:  EKG is personally reviewed.  The ekg ordered today demonstrates sinus rhythm/sinus arrhythmia at 68 bpm. TWI V1-V3  Recent Labs: 09/06/2019: ALT 16; BUN 9; Creatinine 0.77; Hemoglobin 10.7; Platelet Count 310; Potassium 4.1; Sodium 140  Recent Lipid Panel    Component Value Date/Time   CHOL 228 (H) 11/13/2016 1621   TRIG 148 11/13/2016 1621   HDL 62 11/13/2016 1621   CHOLHDL 3.7 11/13/2016 1621   LDLCALC 136 (H) 11/13/2016 1621    Physical Exam:    VS:  BP (!) 162/84    Pulse (!) 46    Ht 5\' 2"  (1.575 m)    Wt 132 lb 12.8 oz (60.2 kg)    SpO2 99%    BMI 24.29 kg/m    Orthostatic vitals: Lying 155/84, HR 64 Sitting 142/80, HR not recorded Standing 124/72, HR 56 Standing 3 min 137/89, HR 63  Wt Readings from Last 3 Encounters:  01/26/20 132 lb 12.8 oz (60.2 kg)  01/19/20 131 lb 6.4 oz (59.6 kg)  12/19/19 131 lb 4 oz (59.5 kg)    GEN: Well nourished, well developed in no acute  distress HEENT: Normal, moist mucous membranes NECK: No JVD CARDIAC: regular rhythm, normal S1 and S2, no rubs or gallops. No murmurs. VASCULAR: Radial and DP pulses 2+ bilaterally. No carotid bruits RESPIRATORY:  Clear to auscultation without rales, wheezing or rhonchi  ABDOMEN: Soft, non-tender, non-distended MUSCULOSKELETAL:  Ambulates independently SKIN: Warm and dry, no edema NEUROLOGIC:  Alert and oriented x 3. No focal neuro deficits noted. PSYCHIATRIC:  Normal affect    ASSESSMENT:    1. Malignant neoplasm of upper-inner quadrant of left breast in female, estrogen receptor positive ([place])   2. Personal history of antineoplastic chemotherapy   3. Shortness of breath   4. Cardiac risk counseling   5. Counseling on health promotion and disease prevention   6. Medication management   7. Tobacco abuse counseling   8. Tobacco use disorder    PLAN:    History of breast cancer, with chemotherapy, radiation, and surgery: -borderline echo previously -atypical shortness of breath, but with history as above, will repeat echo  Tobacco cessation: The patient was counseled on tobacco cessation today  for 3 minutes.  Counseling included reviewing the risks of smoking tobacco products, how it impacts the patient's current medical diagnoses and different strategies for quitting.  Pharmacotherapy to aid in tobacco cessation was not prescribed today.  Cardiac risk counseling and prevention recommendations: -recommend heart healthy/Mediterranean diet, with whole grains, fruits, vegetable, fish, lean meats, nuts, and olive oil. Limit salt. -recommend moderate walking, 3-5 times/week for 30-50 minutes each session. Aim for at least 150 minutes.week. Goal should be pace of 3 miles/hours, or walking 1.5 miles in 30 minutes -recommend avoidance of tobacco products. Avoid excess alcohol. -Additional risk factor control:  -Diabetes risk: A1c is not available, denies history  -Lipids: recommend  checking lipid panel with next lab check, will order  -Blood pressure control: initially elevated, then decreased with standing on orthostatics. Will avoid hypotension. Encouraged hydration. If BP continues to rise may need to start medication  -Weight: BMI 24 -ASCVD risk score: given risk, would consider statin. She would like to quit smoking and reassess risk The 10-year ASCVD risk score Mikey Bussing DC Brooke Bonito., et al., 2013) is: 19.3%   Values used to calculate the score:     Age: 78 years     Sex: Female     Is Non-Hispanic African American: Yes     Diabetic: No     Tobacco smoker: Yes     Systolic Blood Pressure: [id number] mmHg     Is BP treated: No     HDL Cholesterol: 42 mg/dL     Total Cholesterol: 254 mg/dL    Plan for follow up: 1 year or sooner as neede  Linda Dresser, MD, PhD [place]   [facility]    Medication Adjustments/Labs and Tests Ordered: Current medicines are reviewed at length with the patient today.  Concerns regarding medicines are outlined above.  Orders Placed This Encounter  Procedures   Lipid panel   EKG 12-Lead   ECHOCARDIOGRAM COMPLETE   No orders of the defined types were placed in this encounter.   Patient Instructions  Medication Instructions:  Your Physician recommend you continue on your current medication as directed.     *If you need a refill on your cardiac medications before your next appointment, please call your pharmacy*   Lab Work: Your physician recommends that you have lab work done at [place].    Testing/Procedures: Your physician has requested that you have an echocardiogram. Echocardiography is a painless test that uses sound waves to create images of your heart. It provides your doctor with information about the size and shape of your heart and how well your hearts chambers and valves are working. This procedure takes approximately one hour. There are no restrictions for this procedure. [place]  [address]    Follow-Up: At [facility], you and your health needs are our priority.  As part of our continuing mission to provide you with exceptional heart care, we have created designated Provider Care Teams.  These Care Teams include your primary Cardiologist (physician) and Advanced Practice Providers (APPs -  Physician Assistants and Nurse Practitioners) who all work together to provide you with the care you need, when you need it.  We recommend signing up for the patient portal called "MyChart".  Sign up information is provided on this After Visit Summary.  MyChart is used to connect with patients for Virtual Visits (Telemedicine).  Patients are able to view lab/test results, encounter notes, upcoming appointments, etc.  Non-urgent messages can be sent to your  provider as well.   To learn more about what you can do with MyChart, go to [email or web address].    Your next appointment:   1 year(s)  The format for your next appointment:   In Person  Provider:   Buford Dresser, MD     Signed, Linda Dresser, MD PhD 01/26/2020  [place]

## 2020-01-26 NOTE — Telephone Encounter (Signed)
LVM for patient informing of lab appt added at 10:15 am on 02/03/20.  Asked patient to call back if any questions or concerns.  Foye Spurling, BSN, RN Clinical Research Nurse 01/26/2020 10:56 AM

## 2020-01-26 NOTE — Patient Instructions (Addendum)
Medication Instructions:  Your Physician recommend you continue on your current medication as directed.     *If you need a refill on your cardiac medications before your next appointment, please call your pharmacy*   Lab Work: Your physician recommends that you have lab work done at [place].    Testing/Procedures: Your physician has requested that you have an echocardiogram. Echocardiography is a painless test that uses sound waves to create images of your heart. It provides your doctor with information about the size and shape of your heart and how well your heart's chambers and valves are working. This procedure takes approximately one hour. There are no restrictions for this procedure. [place] [address]    Follow-Up: At [facility], you and your health needs are our priority.  As part of our continuing mission to provide you with exceptional heart care, we have created designated Provider Care Teams.  These Care Teams include your primary Cardiologist (physician) and Advanced Practice Providers (APPs -  Physician Assistants and Nurse Practitioners) who all work together to provide you with the care you need, when you need it.  We recommend signing up for the patient portal called "MyChart".  Sign up information is provided on this After Visit Summary.  MyChart is used to connect with patients for Virtual Visits (Telemedicine).  Patients are able to view lab/test results, encounter notes, upcoming appointments, etc.  Non-urgent messages can be sent to your provider as well.   To learn more about what you can do with MyChart, go to [email or web address].    Your next appointment:   1 year(s)  The format for your next appointment:   In Person  Provider:   Buford Dresser, MD

## 2020-01-26 NOTE — Telephone Encounter (Signed)
We can draw her labs. I have added a lab apt on 6/11 at 10:15 am if you can please let the pt know.  Thanks

## 2020-01-31 IMAGING — MR MR BILATERAL BREAST WITHOUT AND WITH CONTRAST
6 of 15 series · 17 of 48 positions shown · IV contrast (Yes)
Comparison: No previous breast MRI. Comparison is made to
diagnostic mammogram and ultrasound dated 01/27/2019.

CLINICAL DATA: Invasive breast cancer of the LEFT breast. Initial
workup prior to treatment.

LABS:  GFR above 60
EXAM:
BILATERAL BREAST MRI WITH AND WITHOUT CONTRAST
TECHNIQUE: Multiplanar, multisequence MR images of both breasts were obtained
prior to and following the intravenous administration of 5 ml of
Gadavist

[Series 1: acess#(phone_number) · axial · 7.0mm · 1.56mm/px · 1 of 25 slices shown]
[im 1/25]
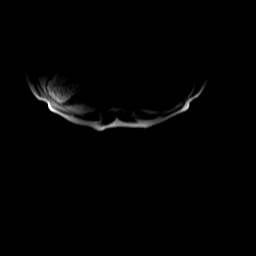

[Series 3: T1 · axial · non-contrast · 1.8mm · 0.57mm/px · z∈[-88,+84]mm · 4 of 192 slices shown (1 of 2)]
[im 1/192]
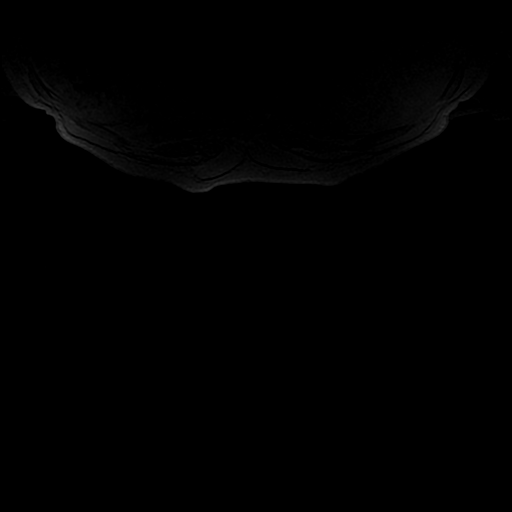
[im 64/192]
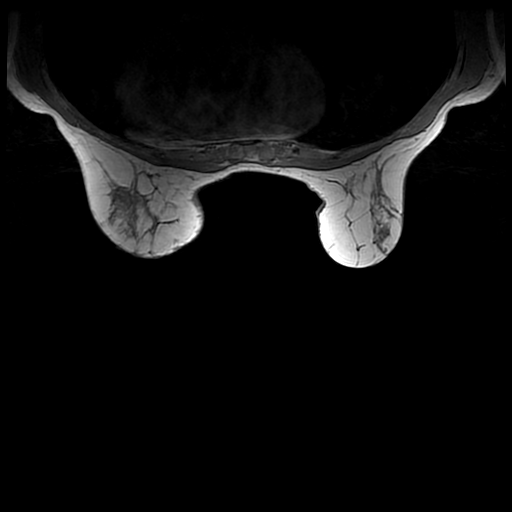
[im 128/192]
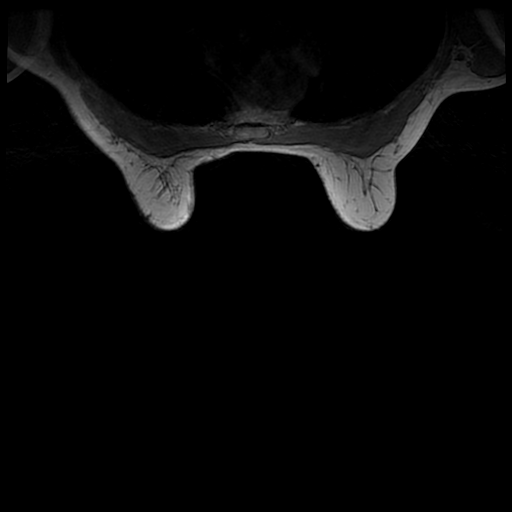
[im 192/192]
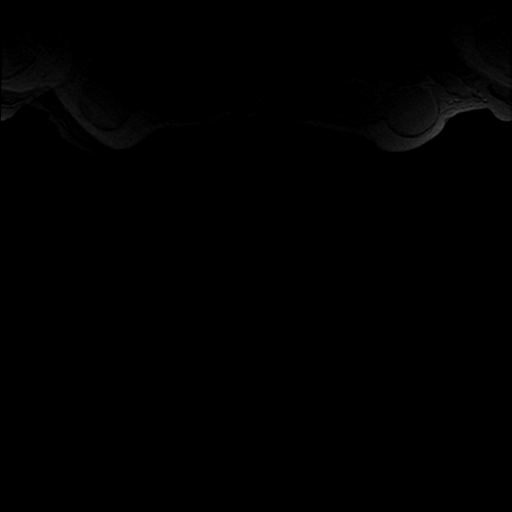

[Series 4: T2 · axial · 3.0mm · 0.57mm/px · 1 of 58 slices shown]
[im 1/58]
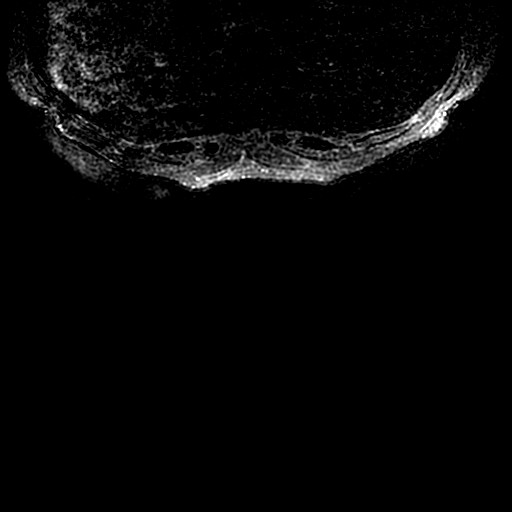

[Series 5: T1 · axial · non-contrast · 1.8mm · 0.57mm/px · z∈[-88,+84]mm · 4 of 192 slices shown (2 of 2)]
[im 1/192]
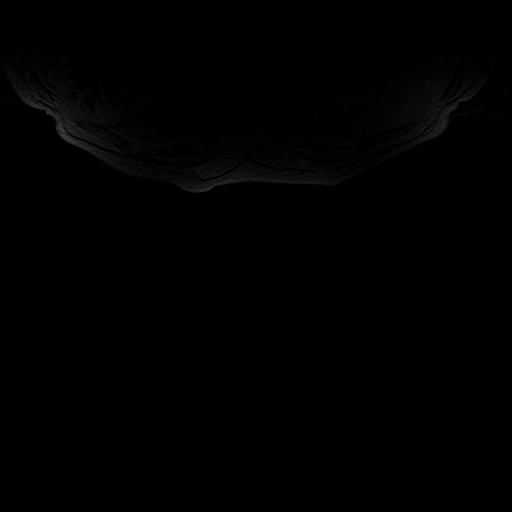
[im 64/192]
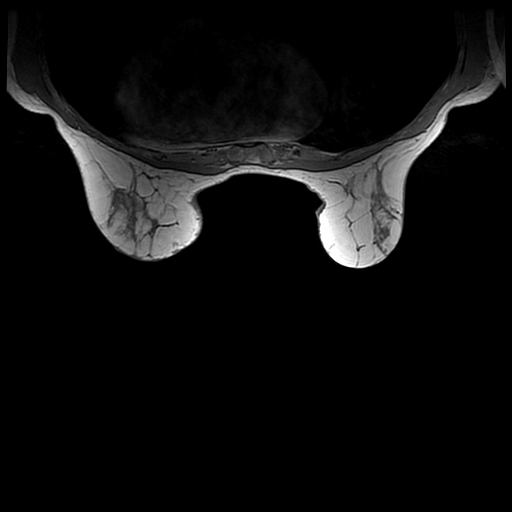
[im 128/192]
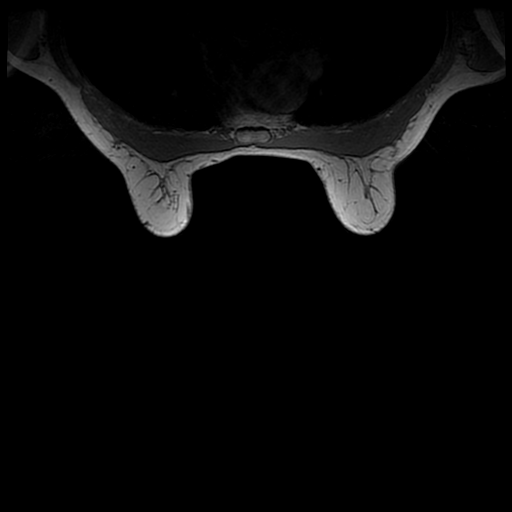
[im 192/192]
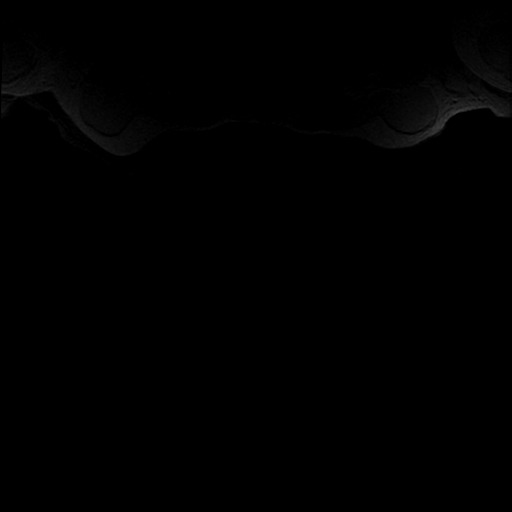

[Series 700: T1 fat-sat · axial · 1.8mm · 0.57mm/px · z∈[-88,+84]mm · 4 of 192 slices shown (1 of 2)]
[im 1/192]
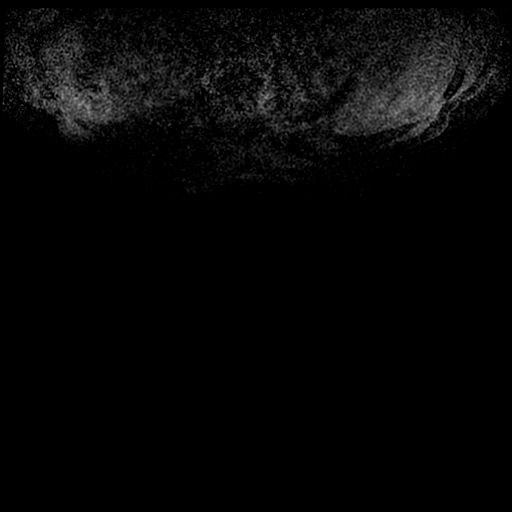
[im 64/192]
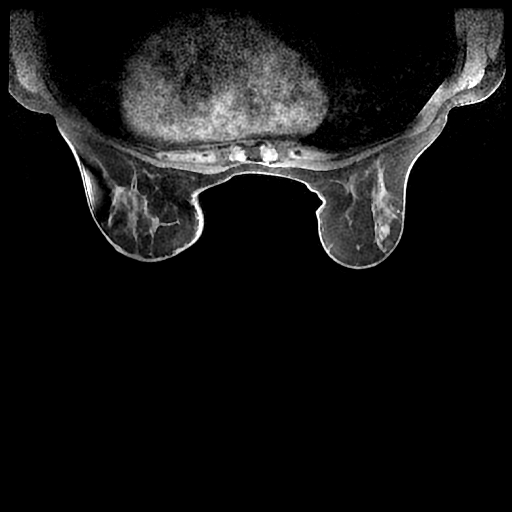
[im 128/192]
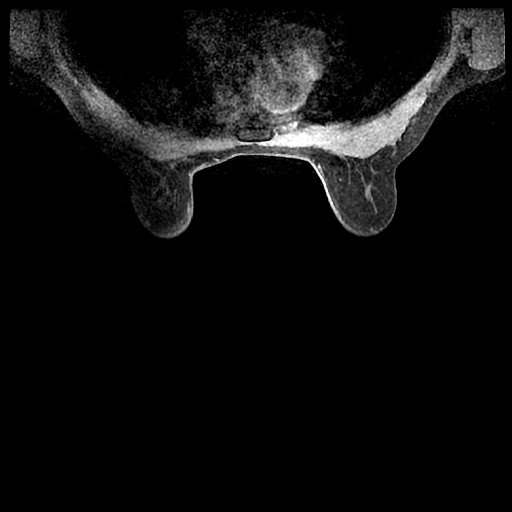
[im 192/192]
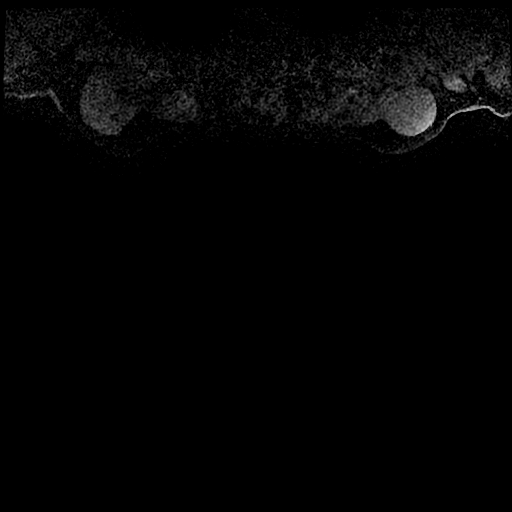

[Series 701: T1 fat-sat · axial · 1.8mm · 0.57mm/px · z∈[-88,-3]mm · 3 of 192 slices shown (2 of 2)]
[im 1/192]
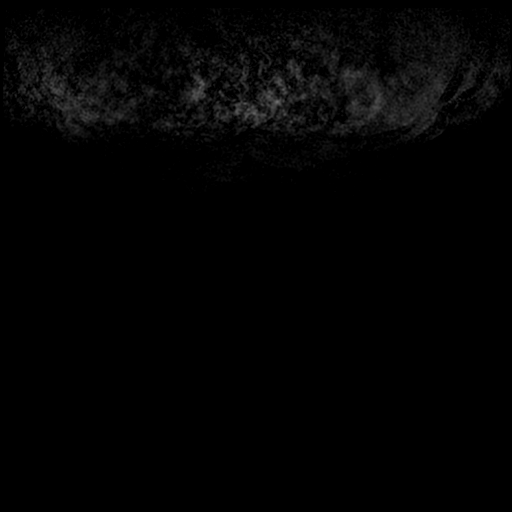
[im 48/192]
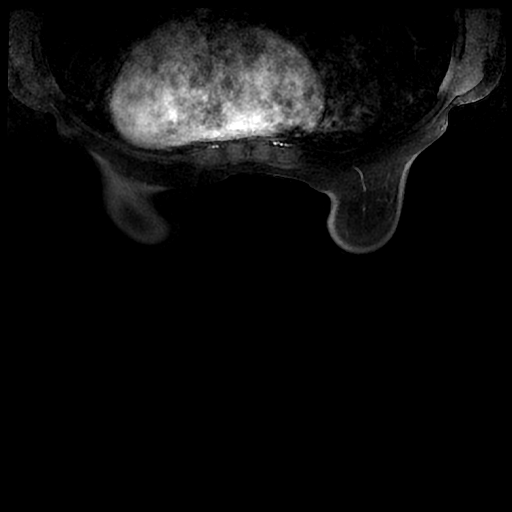
[im 96/192]
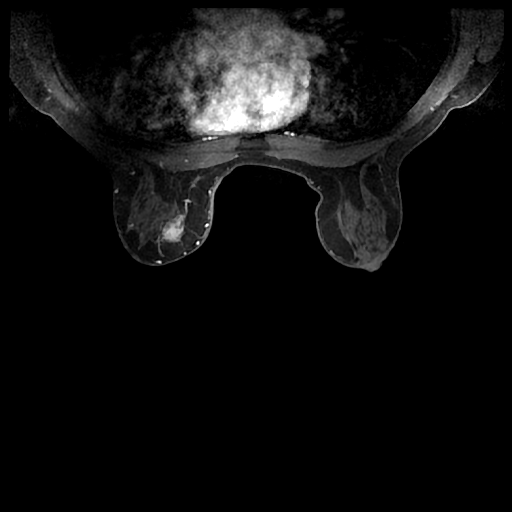

[17 of 48 positions shown; findings below may reference images not displayed]

Three-dimensional MR images were rendered by post-processing of the
original MR data on an independent workstation. The
three-dimensional MR images were interpreted, and findings are
reported in the following complete MRI report for this study. Three
dimensional images were evaluated at the independent [employer]
workstation
FINDINGS: Breast composition: c. Heterogeneous fibroglandular tissue.

Background parenchymal enhancement: Mild

Right breast: No suspicious enhancing masses, non-mass enhancement
or secondary signs of malignancy.

Left breast: Dominant irregular mass within the inner LEFT breast,
involving the lower inner quadrant and upper inner quadrant,
consistent with patient's known biopsy-proven invasive breast
cancer, with associated biopsy clip, main/central component
measuring 2.9 x 2.6 x 3.2 cm (AP by transverse by craniocaudal) but
with contiguous linear non mass enhancement extending across the
midline to the lower outer quadrant and increasing the greatest
dimension to 3.8 cm (series 79390, images 115 through 119).

There is another contiguous linear non mass enhancement that extends
posteriorly from the mass within the upper inner quadrant. This
posterior extension measures 1.6 cm (series 79390, image 107).

Medially, the mass abuts the skin and there is abnormal enhancement
within the skin indicating skin involvement (series 79390, image
117).

There is an additional small mass within the upper inner quadrant of
the LEFT breast, far superior, at posterior depth, measuring 5 mm,
with persistent enhancement kinetics (series 79390, image 74).

No additional suspicious enhancing masses, non-mass enhancement or
secondary signs of malignancy within the LEFT breast.

Lymph nodes: No enlarged or morphologically abnormal lymph nodes
appreciated within the bilateral axillary or internal mammary chain
regions.

Ancillary findings:  None.
IMPRESSION: 1. Dominant biopsy-proven invasive carcinoma within the inner LEFT
breast, involving the lower inner quadrant and upper inner quadrant,
with associated biopsy clip, the main/central component measuring
2.9 x 2.6 x 3.2 cm, but contiguous linear non-mass enhancement
extends from the anterior margin of the mass across the midline to
the lower outer quadrant increasing the AP dimension to 3.8 cm.
Additional contiguous linear non-mass enhancement extends
posteriorly from the mass in the upper inner quadrant to posterior
depth, measuring an additional 1.6 cm extent, increasing the overall
dimension to approximately 5 cm.
2. The biopsy-proven invasive carcinoma within the inner LEFT breast
abuts the overlying skin, and abnormal enhancement of the skin
indicates associated skin involvement.
3. Additional small mass within the upper inner quadrant of the LEFT
breast, far superior, at posterior depth, measuring 5 mm, suspicious
for additional multicentric disease.
4. No evidence of malignancy within the RIGHT breast.
5. No evidence of metastatic lymphadenopathy.

RECOMMENDATION:
1. If breast conservation surgery is considered for the LEFT breast,
in order to definitively characterize the full extent of disease,
would recommend additional MRI-guided biopsies of (1) the contiguous
non-mass enhancement extending from the known invasive carcinoma to
the lower outer quadrant, (2) the contiguous non-mass enhancement
extending from the known invasive carcinoma to posterior depth and
(3) the separate 5 mm mass in the upper inner quadrant of the LEFT
breast, far superior, at posterior depth.
2. Otherwise, per current treatment plan for patient's known LEFT
breast cancer.

BI-RADS CATEGORY  6: Known biopsy-proven malignancy.

## 2020-02-01 ENCOUNTER — Encounter (INDEPENDENT_AMBULATORY_CARE_PROVIDER_SITE_OTHER): Payer: Self-pay

## 2020-02-03 ENCOUNTER — Inpatient Hospital Stay: Payer: Medicaid Other

## 2020-02-03 ENCOUNTER — Inpatient Hospital Stay: Payer: Medicaid Other | Attending: Hematology and Oncology | Admitting: Adult Health

## 2020-02-03 ENCOUNTER — Other Ambulatory Visit: Payer: Self-pay

## 2020-02-03 VITALS — BP 143/79 | HR 54 | Temp 98.2°F | Resp 20 | Ht 62.0 in | Wt 130.3 lb

## 2020-02-03 DIAGNOSIS — C50212 Malignant neoplasm of upper-inner quadrant of left female breast: Secondary | ICD-10-CM

## 2020-02-03 DIAGNOSIS — E2839 Other primary ovarian failure: Secondary | ICD-10-CM

## 2020-02-03 DIAGNOSIS — Z170 Estrogen receptor positive status [ER+]: Secondary | ICD-10-CM

## 2020-02-03 DIAGNOSIS — G620 Drug-induced polyneuropathy: Secondary | ICD-10-CM | POA: Diagnosis not present

## 2020-02-03 DIAGNOSIS — T451X5A Adverse effect of antineoplastic and immunosuppressive drugs, initial encounter: Secondary | ICD-10-CM

## 2020-02-03 LAB — CMP (CANCER CENTER ONLY)
ALT: 20 U/L (ref 0–44)
AST: 25 U/L (ref 15–41)
Albumin: 4.4 g/dL (ref 3.5–5.0)
Alkaline Phosphatase: 107 U/L (ref 38–126)
Anion gap: 11 (ref 5–15)
BUN: 10 mg/dL (ref 6–20)
CO2: 25 mmol/L (ref 22–32)
Calcium: 10.2 mg/dL (ref 8.9–10.3)
Chloride: 103 mmol/L (ref 98–111)
Creatinine: 0.86 mg/dL (ref 0.44–1.00)
GFR, Est AFR Am: >60 mL/min (ref >60)
GFR, Estimated: >60 mL/min (ref >60)
Glucose, Bld: 96 mg/dL (ref 70–99)
Potassium: 4 mmol/L (ref 3.5–5.1)
Sodium: 139 mmol/L (ref 135–145)
Total Bilirubin: 0.5 mg/dL (ref 0.3–1.2)
Total Protein: 8.3 g/dL — ABNORMAL HIGH (ref 6.5–8.1)

## 2020-02-03 LAB — CBC WITH DIFFERENTIAL (CANCER CENTER ONLY)
Abs Immature Granulocytes: 0.04 10*3/uL (ref 0.00–0.07)
Basophils Absolute: 0 10*3/uL (ref 0.0–0.1)
Basophils Relative: 1 %
Eosinophils Absolute: 0.2 10*3/uL (ref 0.0–0.5)
Eosinophils Relative: 3 %
HCT: 43 % (ref 36.0–46.0)
Hemoglobin: 13.5 g/dL (ref 12.0–15.0)
Immature Granulocytes: 1 %
Lymphocytes Relative: 27 %
Lymphs Abs: 1.6 10*3/uL (ref 0.7–4.0)
MCH: 26.8 pg (ref 26.0–34.0)
MCHC: 31.4 g/dL (ref 30.0–36.0)
MCV: 85.5 fL (ref 80.0–100.0)
Monocytes Absolute: 0.7 10*3/uL (ref 0.1–1.0)
Monocytes Relative: 11 %
Neutro Abs: 3.5 10*3/uL (ref 1.7–7.7)
Neutrophils Relative %: 57 %
Platelet Count: 264 10*3/uL (ref 150–400)
RBC: 5.03 MIL/uL (ref 3.87–5.11)
RDW: 15.4 % (ref 11.5–15.5)
WBC Count: 6 10*3/uL (ref 4.0–10.5)
nRBC: 0 % (ref 0.0–0.2)

## 2020-02-03 LAB — LIPID PANEL
Cholesterol: 254 mg/dL — ABNORMAL HIGH (ref 0–200)
HDL: 42 mg/dL (ref >40)
LDL Cholesterol: 181 mg/dL — ABNORMAL HIGH (ref 0–99)
Total CHOL/HDL Ratio: 6 RATIO
Triglycerides: 154 mg/dL — ABNORMAL HIGH (ref <150)
VLDL: 31 mg/dL (ref 0–40)

## 2020-02-03 NOTE — Progress Notes (Signed)
SURVIVORSHIP  VISIT:    BRIEF ONCOLOGIC HISTORY:  Oncology History  Malignant neoplasm of upper-inner quadrant of left breast in female, estrogen receptor positive ([place])  02/07/2019 Initial Diagnosis   Diagnostic mammogram following palpable lump in the left breast x6 months showed 3.0cm mass at the 9 oc'clock position 2cm from the nipple. Biopsy confirmed grade 3 IDC, HER2 negative by FISH, ER+ (100%), PR+ (80%), Ki67 20%.    02/09/2019 Cancer Staging   Staging form: Breast, AJCC 8th Edition - Clinical stage from 02/09/2019: Stage IIA (cT2, cN0, cM0, G3, ER+, PR+, HER2-)   03/22/2019 Surgery   Left lumpectomy [name]) [phone]): IDC, 2.9cm, grade 3, high grade DCIS, lymphovascular invasion present, clear margins and 1/3 lymph nodes positive for carcinoma measuring 0.7cm.    03/29/2019 Cancer Staging   Staging form: Breast, AJCC 8th Edition - Pathologic stage from 03/29/2019: Stage IIA (pT2, pN1a, cM0, G3, ER+, PR+, HER2-)   04/26/2019 - 09/06/2019 Chemotherapy   DOXOrubicin (ADRIAMYCIN) chemo injection 94 mg, 60 mg/m2 = 94 mg, Intravenous,  Once, 4 of 4 cycles. Administration: 94 mg (04/26/2019), 94 mg (05/10/2019), 94 mg (05/24/2019), 94 mg (06/07/2019)  palonosetron (ALOXI) injection 0.25 mg, 0.25 mg, Intravenous,  Once, 4 of 4 cycles. Administration: 0.25 mg (04/26/2019), 0.25 mg (05/10/2019), 0.25 mg (05/24/2019), 0.25 mg (06/07/2019)  pegfilgrastim (NEULASTA ONPRO KIT) injection 6 mg, 6 mg, Subcutaneous, Once, 4 of 4 cycles. Administration: 6 mg (04/26/2019), 6 mg (05/10/2019), 6 mg (05/24/2019), 6 mg (06/07/2019)  cyclophosphamide (CYTOXAN) 940 mg in sodium chloride 0.9 % 250 mL chemo infusion, 600 mg/m2 = 940 mg, Intravenous,  Once, 4 of 4 cycles. Administration: 940 mg (04/26/2019), 940 mg (05/10/2019), 940 mg (05/24/2019), 940 mg (06/07/2019)  PACLitaxel (TAXOL) 126 mg in sodium chloride 0.9 % 250 mL chemo infusion (</= 50m/m2), 80 mg/m2 = 126 mg, Intravenous,  Once, 12 of 12 cycles. Administration:  126 mg (06/21/2019), 126 mg (06/28/2019), 126 mg (07/05/2019), 126 mg (07/12/2019), 126 mg (07/19/2019), 126 mg (07/26/2019), 126 mg (08/02/2019), 126 mg (08/09/2019), 126 mg (08/16/2019), 126 mg (08/23/2019), 126 mg (08/30/2019), 126 mg (09/06/2019)  fosaprepitant (EMEND) 150 mg  dexamethasone (DECADRON) 12 mg in sodium chloride 0.9 % 145 mL IVPB, , Intravenous,  Once, 4 of 4 cycles. Administration:  (04/26/2019),  (05/10/2019),  (05/24/2019),  (06/07/2019).   09/28/2019 - 11/15/2019 Radiation Therapy   The patient initially received a dose of 50.4 Gy in 28 fractions to the breast using whole-breast tangent fields. This was delivered using a 3-D conformal technique. The pt received a boost delivering an additional 12 Gy in 6 fractions using a electron boost with 163m electrons. The patient also received 50.4 Gy in 28 fractions to the left axilla. The total dose was 112.8 Gy.   11/2019 - 11/2024 Anti-estrogen oral therapy   Anastrozole     INTERVAL HISTORY:  Linda Spears review her survivorship care plan detailing her treatment course for breast cancer, as well as monitoring long-term side effects of that treatment, education regarding health maintenance, screening, and overall wellness and health promotion.     Overall, Ms. SaReckarteports feeling quite well.  She is taking Anastrozole daily and is tolerating it well. She completed survivorship survey that revealed she is experiencing fatigue at a 4.  She has mild hot flashes, and these tolerable for her.  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  This contributes to difficulty with opening up her shirt or buttoning from time to time.  REVIEW OF  SYSTEMS:  Review of Systems  Constitutional: Positive for fatigue. Negative for appetite change, chills, fever and unexpected weight change.  HENT:   Negative for hearing loss, sore throat and trouble swallowing.   Eyes: Negative for eye problems and icterus.  Respiratory:  Negative for chest tightness, cough and shortness of breath.   Cardiovascular: Negative for chest pain, leg swelling and palpitations.  Gastrointestinal: Negative for abdominal distention, abdominal pain, constipation, diarrhea, nausea and vomiting.  Endocrine: Positive for hot flashes.  Genitourinary: Negative for difficulty urinating.   Musculoskeletal: Negative for arthralgias.  Skin: Negative for itching and rash.  Neurological: Positive for numbness. Negative for dizziness, extremity weakness and headaches.  Hematological: Negative for adenopathy. Does not bruise/bleed easily.  Psychiatric/Behavioral: Negative for depression. The patient is not nervous/anxious.   Breast: Denies any new nodularity, masses, tenderness, nipple changes, or nipple discharge.      ONCOLOGY TREATMENT TEAM:  1. Surgeon:  Dr. [name] at [facility] 2. Medical Oncologist: Dr. Lindi Adie  3. Radiation Oncologist: Dr. Sondra Come    PAST MEDICAL/SURGICAL HISTORY:  Past Medical History:  Diagnosis Date  . Cancer [facility])    left breast   Past Surgical History:  Procedure Laterality Date  . BREAST LUMPECTOMY WITH RADIOACTIVE SEED AND SENTINEL LYMPH NODE BIOPSY Left 03/22/2019   Procedure: LEFT BREAST LUMPECTOMY X2 WITH RADIOACTIVE SEED AND LEFT AXILLARY SENTINEL LYMPH NODE BIOPSY;  Surgeon: Alphonsa Overall, MD;  Location: [place];  Service: General;  Laterality: Left;  . PORTACATH PLACEMENT N/A 04/19/2019   Procedure: INSERTION PORT-A-CATH WITH ULTRASOUND;  Surgeon: Alphonsa Overall, MD;  Location: [facility];  Service: General;  Laterality: N/A;  . TONSILLECTOMY       ALLERGIES:  No Known Allergies   CURRENT MEDICATIONS:  Outpatient Encounter Medications as of 02/03/2020  Medication Sig Note  . acetaminophen (TYLENOL) 500 MG tablet Take 1,000 mg by mouth every 6 (six) hours as needed for moderate pain.   Marland Kitchen anastrozole (ARIMIDEX) 1 MG tablet Take 1 tablet (1 mg total) by mouth daily.   . metroNIDAZOLE (FLAGYL)  500 MG tablet Take 1 tablet (500 mg total) by mouth 2 (two) times daily.   . [DISCONTINUED] lidocaine-prilocaine (EMLA) cream Apply to affected area once 07/12/2019: Started 05/10/19  . [DISCONTINUED] pegfilgrastim (NEULASTA) injection 6 mg     No facility-administered encounter medications on file as of 02/03/2020.     ONCOLOGIC FAMILY HISTORY:  Family History  Problem Relation Age of Onset  . Hypertension Mother   . Migraines Mother   . Breast cancer Sister 13     GENETIC COUNSELING/TESTING: Not at this time  SOCIAL HISTORY:  Social History   Socioeconomic History  . Marital status: Single    Spouse name: Not on file  . Number of children: 3  . Years of education: Not on file  . Highest education level: Bachelor's degree (e.g., BA, AB, BS)  Occupational History  . Not on file  Tobacco Use  . Smoking status: Current Every Day Smoker    Packs/day: 0.25    Types: Cigarettes  . Smokeless tobacco: Never Used  Vaping Use  . Vaping Use: Never used  Substance and Sexual Activity  . Alcohol use: Yes    Comment: social  . Drug use: No  . Sexual activity: Not Currently  Other Topics Concern  . Not on file  Social History Narrative  . Not on file   Social Determinants of Health   Financial Resource Strain:   . Difficulty of  Paying Living Expenses:   Food Insecurity:   . Worried About Charity fundraiser in the Last Year:   . Arboriculturist in the Last Year:   Transportation Needs:   . Film/video editor (Medical):   Marland Kitchen Lack of Transportation (Non-Medical):   Physical Activity:   . Days of Exercise per Week:   . Minutes of Exercise per Session:   Stress:   . Feeling of Stress :   Social Connections:   . Frequency of Communication with Friends and Family:   . Frequency of Social Gatherings with Friends and Family:   . Attends Religious Services:   . Active Member of Clubs or Organizations:   . Attends Archivist Meetings:   Marland Kitchen Marital Status:    Intimate Partner Violence:   . Fear of Current or Ex-Partner:   . Emotionally Abused:   Marland Kitchen Physically Abused:   . Sexually Abused:      OBSERVATIONS/OBJECTIVE:  BP (!) 143/79 (BP Location: Right Arm, Patient Position: Sitting)   Pulse (!) 54   Temp 98.2 F (36.8 C)   Resp 20   Ht _0  (1.575 m)   Wt 130 lb 4.8 oz (59.1 kg)   SpO2 98%   BMI 23.83 kg/m  GENERAL: Patient is a well appearing female in no acute distress HEENT:  Sclerae anicteric.  Oropharynx clear and moist. No ulcerations or evidence of oropharyngeal candidiasis. Neck is supple.  NODES:  No cervical, supraclavicular, or axillary lymphadenopathy palpated.  BREAST EXAM:  Deferred. LUNGS:  Clear to auscultation bilaterally.  No wheezes or rhonchi. HEART:  Regular rate and rhythm. No murmur appreciated. ABDOMEN:  Soft, nontender.  Positive, normoactive bowel sounds. No organomegaly palpated. MSK:  No focal spinal tenderness to palpation. Full range of motion bilaterally in the upper extremities. EXTREMITIES:  No peripheral edema.   SKIN:  Clear with no obvious rashes or skin changes. No nail dyscrasia. NEURO:  Nonfocal. Well oriented.  Appropriate affect.   LABORATORY DATA:  None for this visit.  DIAGNOSTIC IMAGING:  None for this visit.      ASSESSMENT AND PLAN:  Ms.. Witherspoon is a pleasant 59 y.o. female with Stage IIA left breast invasive ductal carcinoma, ER+/PR+/HER2-, diagnosed in 01/2019, treated with lumpectomy, adjuvant chemotherapy, adjuvant radiation therapy, and anti-estrogen therapy with Anastrozole beginning in 11/2019.  She presents to the Survivorship Clinic for our initial meeting and routine follow-up post-completion of treatment for breast cancer.    1. Stage IIA eft breast cancer:  Ms. Sovine is continuing to recover from definitive treatment for breast cancer. She will follow-up with her medical oncologist, Dr. Lindi Adie in 05/2020 with history and physical exam per surveillance protocol.  She will  continue her anti-estrogen therapy with Anastrozole. Thus far, she is tolerating the Anastrozole well, with minimal side effects. She was instructed to make Dr. Lindi Adie or myself aware if she begins to experience any worsening side effects of the medication and I could see her back in clinic to help manage those side effects, as needed. Her mammogram is due 04/2020; orders placed today. Today, a comprehensive survivorship care plan and treatment summary was reviewed with the patient today detailing her breast cancer diagnosis, treatment course, potential late/long-term effects of treatment, appropriate follow-up care with recommendations for the future, and patient education resources.  A copy of this summary, along with a letter will be sent to the patient's primary care provider via mail/fax/In Basket message after today's visit.  2. 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  No intervention required at this point.    3. Bone health:  Given Ms. Gouge's age/history of breast cancer and her current treatment regimen including anti-estrogen therapy with Anastrozole, she is at risk for bone demineralization. I placed orders for her to undergo bone density testing in 04/2020 when she has her mammogram.  She was given education on specific activities to promote bone health.  4. Cancer screening:  Due to Ms. Asche's history and her age, she should receive screening for skin cancers, colon cancer, and gynecologic cancers.  The information and recommendations are listed on the patient's comprehensive care plan/treatment summary and were reviewed in detail with the patient.    5. Health maintenance and wellness promotion: Ms. Digilio was encouraged to consume 5-7 servings of fruits and vegetables per day. We reviewed the "Nutrition Rainbow" handout, as well as the handout "Take Control of Your Health and Reduce Your Cancer Risk" from the [place].  She was also encouraged to engage in  moderate to vigorous exercise for 30 minutes per day most days of the week. We discussed the [employer] fitness program, which is designed for cancer survivors to help them become more physically fit after cancer treatments.  She was instructed to limit her alcohol consumption and was recommended to quit smoking.     6. Support services/counseling: It is not uncommon for this period of the patient's cancer care trajectory to be one of many emotions and stressors.  We discussed how this can be increasingly difficult during the times of quarantine and social distancing due to the COVID-19 pandemic.   She was given information regarding our available services and encouraged to contact me with any questions or for help enrolling in any of our support group/programs.    Follow up instructions:    -Return to cancer center 05/2020 for f/u with Dr. Lindi Adie  -Mammogram due in 04/2020 -Bone density 04/2020 -She is welcome to return back to the Survivorship Clinic at any time; no additional follow-up needed at this time.  -Consider referral back to survivorship as a long-term survivor for continued surveillance  The patient was provided an opportunity to ask questions and all were answered. The patient agreed with the plan and demonstrated an understanding of the instructions.   Total encounter time: 45 minutes*  Wilber Bihari, NP 02/03/20 10:54 AM Medical Oncology and Hematology [facility] [place], [place] [address] Tel. [phone]    Fax. [phone]  *Total Encounter Time as defined by the Centers for Medicare and Medicaid Services includes, in addition to the face-to-face time of a patient visit (documented in the note above) non-face-to-face time: obtaining and reviewing outside history, ordering and reviewing medications, tests or procedures, care coordination (communications with other health care professionals or caregivers) and documentation in the medical  record.

## 2020-02-06 ENCOUNTER — Telehealth: Payer: Self-pay | Admitting: Adult Health

## 2020-02-06 NOTE — Telephone Encounter (Signed)
No 6/11 LOS. No changes made to pt's schedule.

## 2020-02-07 ENCOUNTER — Encounter (INDEPENDENT_AMBULATORY_CARE_PROVIDER_SITE_OTHER): Payer: Self-pay

## 2020-02-12 ENCOUNTER — Encounter (INDEPENDENT_AMBULATORY_CARE_PROVIDER_SITE_OTHER): Payer: Self-pay

## 2020-02-14 ENCOUNTER — Telehealth: Payer: Self-pay | Admitting: *Deleted

## 2020-02-14 ENCOUNTER — Ambulatory Visit (HOSPITAL_COMMUNITY): Payer: Medicaid Other | Attending: Cardiology

## 2020-02-14 ENCOUNTER — Other Ambulatory Visit: Payer: Self-pay

## 2020-02-14 ENCOUNTER — Encounter (INDEPENDENT_AMBULATORY_CARE_PROVIDER_SITE_OTHER): Payer: Self-pay

## 2020-02-14 DIAGNOSIS — Z170 Estrogen receptor positive status [ER+]: Secondary | ICD-10-CM | POA: Diagnosis not present

## 2020-02-14 DIAGNOSIS — R0602 Shortness of breath: Secondary | ICD-10-CM | POA: Insufficient documentation

## 2020-02-14 DIAGNOSIS — Z9221 Personal history of antineoplastic chemotherapy: Secondary | ICD-10-CM | POA: Diagnosis not present

## 2020-02-14 DIAGNOSIS — C50212 Malignant neoplasm of upper-inner quadrant of left female breast: Secondary | ICD-10-CM | POA: Insufficient documentation

## 2020-02-16 NOTE — Telephone Encounter (Signed)
Upbeat; Called patient to follow up on her plans for moving to [place]. Window for 12 months visit is 02/20/20-06/19/20.  Patient states her plans have changed and she is moving sooner than expected, likely the beginning of August. Although, it is contingent on finding an apartment.  Patient currently scheduled for 12 months Upbeat visit in September but she thinks she will be moved by then. Patient agreed to come in July for this visit but cannot schedule exact date right now because she is currently in the airport going to visit her son. Patient requests research nurse call her back on Tuesday next week to schedule. Thanked patient for her time and will call her next week to schedule the Upbeat visit.  [id number];  Window for next visit, 52 weeks, is 05/22/20-07/17/20. Will continue to follow patient's plans to move to see if this can be arranged. Foye Spurling, BSN, RN Clinical Research Nurse 02/16/2020 3:57 PM

## 2020-02-21 ENCOUNTER — Telehealth: Payer: Self-pay | Admitting: *Deleted

## 2020-02-21 NOTE — Telephone Encounter (Signed)
Upbeat Study; LVM for patient requesting she call research nurse back to schedule 12 months study visit which can be done anytime now until 06/19/20.   Foye Spurling, BSN, RN Clinical Research Nurse 02/21/2020 11:33 AM

## 2020-02-22 ENCOUNTER — Telehealth: Payer: Self-pay | Admitting: *Deleted

## 2020-02-22 DIAGNOSIS — Z006 Encounter for examination for normal comparison and control in clinical research program: Secondary | ICD-10-CM

## 2020-02-22 NOTE — Telephone Encounter (Signed)
Upbeat Study; Patient returned call to schedule 12 months Upbeat visit. Patient is still looking for an apartment and a job in [place].  She hopes to move at the end of August if everything works out. Patient agreed to come into clinic on 03/13/20 at 9 am.  Informed patient of fasting labs x 3 hrs and other research activities including physical functions testing, questionnaires and neurocognitive testing. Informed patient of option to complete study questionnaires by email and patient stated she prefers to complete them in clinic.  Thanked patient for her time and informed her that research nurse will call her prior to appointment to remind her to fast.  Encouraged patient to call if any questions. She verbalized understanding.  Foye Spurling, BSN, RN Clinical Research Nurse 02/22/2020 1:28 PM

## 2020-02-28 ENCOUNTER — Encounter (INDEPENDENT_AMBULATORY_CARE_PROVIDER_SITE_OTHER): Payer: Self-pay

## 2020-03-05 ENCOUNTER — Telehealth: Payer: Self-pay | Admitting: *Deleted

## 2020-03-05 NOTE — Telephone Encounter (Signed)
Upbeat Study; Patient called to change the date/time of her Upbeat visit form 03/13/20 to 03/22/20.  Schedule changed and research nurse will call patient the day before to confirm and remind her to fast for labs. Patient verbalized understanding. Thanked patient for calling.  Foye Spurling, BSN, RN Clinical Research Nurse 03/05/2020 10:50 AM

## 2020-03-06 ENCOUNTER — Encounter (INDEPENDENT_AMBULATORY_CARE_PROVIDER_SITE_OTHER): Payer: Self-pay

## 2020-03-13 ENCOUNTER — Encounter (INDEPENDENT_AMBULATORY_CARE_PROVIDER_SITE_OTHER): Payer: Self-pay

## 2020-03-13 ENCOUNTER — Encounter: Payer: Medicaid Other | Admitting: *Deleted

## 2020-03-13 ENCOUNTER — Other Ambulatory Visit: Payer: Medicaid Other

## 2020-03-20 ENCOUNTER — Encounter: Payer: Self-pay | Admitting: Cardiology

## 2020-03-20 ENCOUNTER — Encounter (INDEPENDENT_AMBULATORY_CARE_PROVIDER_SITE_OTHER): Payer: Self-pay

## 2020-03-21 ENCOUNTER — Telehealth: Payer: Self-pay | Admitting: *Deleted

## 2020-03-21 NOTE — Telephone Encounter (Signed)
Upbeat Study;  Called patient to remind her of research appointment tomorrow starting at 3:30 pm. Reminded patient to fast for at least 3 hours prior to lab appointment.  Patient confirmed her appointment and verbalized understanding to fast.  Foye Spurling, BSN, RN Clinical Research Nurse 03/21/2020 3:29 PM

## 2020-03-22 ENCOUNTER — Inpatient Hospital Stay: Payer: Medicaid Other | Attending: Hematology and Oncology

## 2020-03-22 ENCOUNTER — Other Ambulatory Visit: Payer: Self-pay

## 2020-03-22 ENCOUNTER — Inpatient Hospital Stay: Payer: Medicaid Other | Admitting: *Deleted

## 2020-03-22 ENCOUNTER — Encounter: Payer: Self-pay | Admitting: *Deleted

## 2020-03-22 VITALS — BP 158/85 | HR 50 | Ht 62.0 in | Wt 132.0 lb

## 2020-03-22 DIAGNOSIS — Z170 Estrogen receptor positive status [ER+]: Secondary | ICD-10-CM

## 2020-03-22 DIAGNOSIS — C50212 Malignant neoplasm of upper-inner quadrant of left female breast: Secondary | ICD-10-CM

## 2020-03-22 DIAGNOSIS — Z006 Encounter for examination for normal comparison and control in clinical research program: Secondary | ICD-10-CM

## 2020-03-22 LAB — CBC WITH DIFFERENTIAL (CANCER CENTER ONLY)
Abs Immature Granulocytes: 0.03 10*3/uL (ref 0.00–0.07)
Basophils Absolute: 0 10*3/uL (ref 0.0–0.1)
Basophils Relative: 1 %
Eosinophils Absolute: 0.2 10*3/uL (ref 0.0–0.5)
Eosinophils Relative: 3 %
HCT: 40 % (ref 36.0–46.0)
Hemoglobin: 12.6 g/dL (ref 12.0–15.0)
Immature Granulocytes: 1 %
Lymphocytes Relative: 35 %
Lymphs Abs: 2.1 10*3/uL (ref 0.7–4.0)
MCH: 27.3 pg (ref 26.0–34.0)
MCHC: 31.5 g/dL (ref 30.0–36.0)
MCV: 86.8 fL (ref 80.0–100.0)
Monocytes Absolute: 0.6 10*3/uL (ref 0.1–1.0)
Monocytes Relative: 11 %
Neutro Abs: 3.1 10*3/uL (ref 1.7–7.7)
Neutrophils Relative %: 49 %
Platelet Count: 276 10*3/uL (ref 150–400)
RBC: 4.61 MIL/uL (ref 3.87–5.11)
RDW: 14.9 % (ref 11.5–15.5)
WBC Count: 6.1 10*3/uL (ref 4.0–10.5)
nRBC: 0 % (ref 0.0–0.2)

## 2020-03-22 LAB — CMP (CANCER CENTER ONLY)
ALT: 21 U/L (ref 0–44)
AST: 22 U/L (ref 15–41)
Albumin: 4.3 g/dL (ref 3.5–5.0)
Alkaline Phosphatase: 98 U/L (ref 38–126)
Anion gap: 11 (ref 5–15)
BUN: 10 mg/dL (ref 6–20)
CO2: 24 mmol/L (ref 22–32)
Calcium: 10.7 mg/dL — ABNORMAL HIGH (ref 8.9–10.3)
Chloride: 103 mmol/L (ref 98–111)
Creatinine: 0.9 mg/dL (ref 0.44–1.00)
GFR, Est AFR Am: >60 mL/min (ref >60)
GFR, Estimated: >60 mL/min (ref >60)
Glucose, Bld: 88 mg/dL (ref 70–99)
Potassium: 4 mmol/L (ref 3.5–5.1)
Sodium: 138 mmol/L (ref 135–145)
Total Bilirubin: 0.4 mg/dL (ref 0.3–1.2)
Total Protein: 8.1 g/dL (ref 6.5–8.1)

## 2020-03-22 LAB — RESEARCH LABS

## 2020-03-22 NOTE — Research (Signed)
12 MONTHS ACTIVITIES FOR UPBEAT ([id number]) STUDY; Patient in clinic today to complete the 12 months research activities today.   PROs: Given to patient at registration. Research nurse collected and checked for completeness and accuracy.  Research Labs:  Completed. Patient confirmed she has been fasting for at least 3 hours.   VS: Obtained today per CRF13 instructions.  See VS flowsheet Ht & Wt: Obtained today per CRF13 instructions.  See VS flowsheet Waist Measurement:  34.5 inches, obtained today per CRF 13 instructions.  Cardiovascular Events: Patient has not been hospitalized since last study visit and she denies any of the cardiovascular events listed on CRF28.  Concomitant Medication Review: Patient reports currently taking Anastrozole daily she started in early April 2021. She also takes tylenol PRN and took flagyl for an infection in June but completed the course. Patient finished chemotherapy in January and hasn't taken any pre-meds or any antiemetics since that time. She also has not used any EMLA cream since the end of chemotherapy.  Medication list updated.  Neurocognitive Testing: Completed today by Farris Has, research assistant.  Physical Functions Testing: Patient completed today per protocol. ROM in left shoulder is significantly less than her right shoulder. Patient states this is the side she had surgery and it is difficult to raise her left arm. Patient states she has not received physical therapy in the past, but would appreciate a referral. Message sent to Dr. Lindi Adie informing him of patient's request to see PHT for decrease ROM in left shoulder.  Gift Card: [phone] [employer] gift card given to patient today after she completed all the study activities for this time point.  Plan: Next study visit due for 12 months assessment on 04/15/21 (+/- 60 days). Patient reports at this time she is planning to move to [place] at the beginning of September and does not plan on coming back  to [place] after she moves. Patient does not know yet what [facility] she will transfer her care to as she still needs to check with her insurance company.  Informed patient that research nurse will be in touch to schedule this visit a few months in advance.  If patient has moved and cannot come back to [facility] for the visit we can see if the study is open at the cancer center she transfers.  Patient agreed to research nurse contacting her for future visits and she said she would be willing to continue the study in [place] if it is available at her next cancer center.  Thanked patient for her participation.  Foye Spurling, BSN, RN Clinical Research Nurse 03/22/2020

## 2020-03-23 NOTE — Progress Notes (Signed)
[id number] Study; Next study visit is due for 52 weeks assessment between 05/22/20 and 07/17/20. Patient reports at this time she is planning to move to [place] at the beginning of September and does not plan on coming back to [place] after she moves. Informed patient that research nurse will contact her after the beginning of September to see if she has moved yet. If patient has moved and cannot come back to [facility] for the visit we can see if the study is open at the cancer center she transfers.  Patient agreed to research nurse contacting her for future visits and she said she would be willing to continue the study in [place] if it is available at her next cancer center.  Thanked patient for her participation. Foye Spurling, BSN, RN Clinical Research Nurse 03/23/2020 3:12 PM

## 2020-03-26 ENCOUNTER — Other Ambulatory Visit: Payer: Self-pay | Admitting: Hematology and Oncology

## 2020-03-26 DIAGNOSIS — C50212 Malignant neoplasm of upper-inner quadrant of left female breast: Secondary | ICD-10-CM

## 2020-03-27 ENCOUNTER — Other Ambulatory Visit (INDEPENDENT_AMBULATORY_CARE_PROVIDER_SITE_OTHER): Payer: Self-pay | Admitting: Primary Care

## 2020-03-27 ENCOUNTER — Encounter (INDEPENDENT_AMBULATORY_CARE_PROVIDER_SITE_OTHER): Payer: Self-pay

## 2020-03-27 DIAGNOSIS — E782 Mixed hyperlipidemia: Secondary | ICD-10-CM

## 2020-03-27 MED ORDER — ATORVASTATIN CALCIUM 40 MG PO TABS: 40.0000 mg | ORAL_TABLET | Freq: Every day | ORAL | 3 refills | Status: DC

## 2020-03-27 NOTE — Progress Notes (Signed)
The 10-year ASCVD risk score Mikey Bussing DC Brooke Bonito., et al., 2013) is: 25%   Values used to calculate the score:     Age: 59 years     Sex: Female     Is Non-Hispanic African American: Yes     Diabetic: No     Tobacco smoker: Yes     Systolic Blood Pressure: 715 mmHg     Is BP treated: No     HDL Cholesterol: 42 mg/dL     Total Cholesterol: 254 mg/dL. Received cholesterol panel from [place] risk score indicates a need for statin therapy.  Patient also needs to decrease your fatty foods, red meat, cheese, milk and increase fiber like whole grains and veggies. You can also add a fiber supplement like Metamucil or Benefiber.

## 2020-03-28 ENCOUNTER — Telehealth: Payer: Self-pay | Admitting: *Deleted

## 2020-03-28 NOTE — Telephone Encounter (Signed)
Upbeat Lab results;  Dr. Lindi Adie reviewed research lab results and instructed for copy to be sent to PCP.  Research assistant, Farris Has, sent copy to PCP.  Research nurse called patient with results informing her that her total cholesterol, triglycerides and LDL are elevated.  Compared to the lipid panel she had done in June this year, the cholesterol and LDL are a little better and the triglycerides are a little worse.  Informed patient that 2 copies of results will be mailed to her home address. Patient verbalized understanding.  Confirmed her home address is same as listed in EMR.  Also informed patient Dr. Lindi Adie made referral to PHT for her decreased ROM in left shoulder.  Patient states she was called for appointment this morning.  Foye Spurling, BSN, RN Clinical Research Nurse 03/28/2020 3:38 PM

## 2020-04-03 ENCOUNTER — Encounter (INDEPENDENT_AMBULATORY_CARE_PROVIDER_SITE_OTHER): Payer: Self-pay

## 2020-04-10 ENCOUNTER — Encounter: Payer: Self-pay | Admitting: Physical Therapy

## 2020-04-10 ENCOUNTER — Encounter (INDEPENDENT_AMBULATORY_CARE_PROVIDER_SITE_OTHER): Payer: Self-pay

## 2020-04-10 ENCOUNTER — Ambulatory Visit: Payer: Medicaid Other | Attending: Hematology and Oncology | Admitting: Physical Therapy

## 2020-04-10 ENCOUNTER — Other Ambulatory Visit: Payer: Self-pay

## 2020-04-10 DIAGNOSIS — M25512 Pain in left shoulder: Secondary | ICD-10-CM | POA: Diagnosis present

## 2020-04-10 DIAGNOSIS — R293 Abnormal posture: Secondary | ICD-10-CM | POA: Insufficient documentation

## 2020-04-10 DIAGNOSIS — I890 Lymphedema, not elsewhere classified: Secondary | ICD-10-CM | POA: Diagnosis not present

## 2020-04-10 DIAGNOSIS — M25612 Stiffness of left shoulder, not elsewhere classified: Secondary | ICD-10-CM | POA: Insufficient documentation

## 2020-04-10 DIAGNOSIS — L599 Disorder of the skin and subcutaneous tissue related to radiation, unspecified: Secondary | ICD-10-CM

## 2020-04-10 NOTE — Therapy (Addendum)
[place], [place], [address] Phone: [phone]   Fax:  [phone]  Physical Therapy Evaluation  Patient Details  Name: Linda Spears MRN: [id number] Date of Birth: 07/14/1961 Referring Provider (PT): Lindi Adie   Encounter Date: 04/10/2020   PT End of Session - 04/10/20 0951    Visit Number 1    Number of Visits 4    Date for PT Re-Evaluation 05/10/20    Authorization Type medicaid- [employer]- treat like medicaid - pt trying to change this plan    Authorization Time Period 30 days    PT Start Time 0908    PT Stop Time 0949    PT Time Calculation (min) 41 min    Activity Tolerance Patient tolerated treatment well    Behavior During Therapy [facility] for tasks assessed/performed           Past Medical History:  Diagnosis Date  . Cancer [facility])    left breast    Past Surgical History:  Procedure Laterality Date  . BREAST LUMPECTOMY WITH RADIOACTIVE SEED AND SENTINEL LYMPH NODE BIOPSY Left 03/22/2019   Procedure: LEFT BREAST LUMPECTOMY X2 WITH RADIOACTIVE SEED AND LEFT AXILLARY SENTINEL LYMPH NODE BIOPSY;  Surgeon: Alphonsa Overall, MD;  Location: [facility];  Service: General;  Laterality: Left;  . PORTACATH PLACEMENT N/A 04/19/2019   Procedure: INSERTION PORT-A-CATH WITH ULTRASOUND;  Surgeon: Alphonsa Overall, MD;  Location[facility];  Service: General;  Laterality: N/A;  . TONSILLECTOMY      There were no vitals filed for this visit.    Subjective Assessment - 04/10/20 0911    Subjective I have neuropathy in my finger tips. I have sharp pains in my left shoulder. I have trouble raising my arm. I have tightness in my armpit. There is some swelling as well.    Pertinent History L breast cancer ER+ PR+, HER2-, left lumpectomy 03/22/19 DCIS 1 of 3 nodes positive, pt has completed chemo and radiation    Patient Stated Goals to be able to reach overhead, decrease pain, get dressed easily    Currently in Pain? Yes      Pain Score 4     Pain Location Shoulder    Pain Orientation Left    Pain Descriptors / Indicators Sharp    Pain Type Acute pain    Pain Onset 1 to 4 weeks ago    Pain Frequency Intermittent    Aggravating Factors  stretching or reaching    Pain Relieving Factors rubbing it    Effect of Pain on Daily Activities hard to clean house              [facility] PT Assessment - 04/10/20 0001      Assessment   Medical Diagnosis left breast cancer    Referring Provider (PT) Lindi Adie    Onset Date/Surgical Date 03/22/19    Hand Dominance Right    Prior Therapy none      Precautions   Precautions Other (comment)    Precaution Comments at risk of lymphedema      Restrictions   Weight Bearing Restrictions No      Balance Screen   Has the patient fallen in the past 6 months No    Has the patient had a decrease in activity level because of a fear of falling?  No    Is the patient reluctant to leave their home because of a fear of falling?  No      Home  Social worker Private residence    Living Arrangements Alone    Available Help at Discharge Family;Friend(s)    Type of Home Apartment      Prior Function   Level of Independence Independent;Other (comment)   gets assist to lift water when shopping and to do laundry   Vocation Part time employment    Vocation Requirements pt currently working from home - works on [employer]    Leisure walks 2-3x/wk and also uses the treadmill, recently began yoga      Cognition   Overall Cognitive Status Within Functional Limits for tasks assessed      Observation/Other Assessments   Observations left breast has some fibrosis in the inferior breast, there is decreased scar mobility, some axillary swelling noted      Posture/Postural Control   Posture/Postural Control Postural limitations    Postural Limitations Rounded Shoulders;Forward head      ROM / Strength   AROM / PROM / Strength AROM      AROM   AROM  Assessment Site Shoulder    Right/Left Shoulder Right;Left    Right Shoulder Flexion 155 Degrees    Right Shoulder ABduction 168 Degrees    Right Shoulder Internal Rotation 45 Degrees    Right Shoulder External Rotation 90 Degrees    Left Shoulder Flexion 138 Degrees    Left Shoulder ABduction 114 Degrees    Left Shoulder Internal Rotation 38 Degrees    Left Shoulder External Rotation 69 Degrees             LYMPHEDEMA/ONCOLOGY QUESTIONNAIRE - 04/10/20 0001      Type   Cancer Type left breast cancer      Surgeries   Lumpectomy Date 03/22/19    Sentinel Lymph Node Biopsy Date 03/22/19    Number Lymph Nodes Removed 3   1 of 3 positive nodes     Date Lymphedema/Swelling Started   Date 01/24/20      Treatment   Active Chemotherapy Treatment No    Past Chemotherapy Treatment Yes    Active Radiation Treatment No    Past Radiation Treatment Yes    Current Hormone Treatment Yes    Drug Name anastrozole    Past Hormone Therapy No      What other symptoms do you have   Are you Having Heaviness or Tightness Yes    Are you having Pain Yes    Are you having pitting edema No    Is it Hard or Difficult finding clothes that fit Yes    Do you have infections No    Is there Decreased scar mobility Yes      Lymphedema Assessments   Lymphedema Assessments Upper extremities      Right Upper Extremity Lymphedema   15 cm Proximal to Olecranon Process 25.5 cm    Olecranon Process 22.9 cm    15 cm Proximal to Ulnar Styloid Process 23 cm    Just Proximal to Ulnar Styloid Process 14.5 cm    Across Hand at [employer] 19.1 cm    At [facility] of 2nd Digit 5.9 cm      Left Upper Extremity Lymphedema   15 cm Proximal to Olecranon Process 26.9 cm    Olecranon Process 23 cm    15 cm Proximal to Ulnar Styloid Process 21.9 cm    Just Proximal to Ulnar Styloid Process 14.5 cm    Across Hand at [employer] 18.9 cm  At [facility] of 2nd Digit 5.8 cm                 Katina Dung -  04/10/20 0001    Open a tight or new jar Moderate difficulty    Do heavy household chores (wash walls, wash floors) Severe difficulty    Carry a shopping bag or briefcase Moderate difficulty    Wash your back Severe difficulty    Use a knife to cut food Moderate difficulty    Recreational activities in which you take some force or impact through your arm, shoulder, or hand (golf, hammering, tennis) Moderate difficulty    During the past week, to what extent has your arm, shoulder or hand problem interfered with your normal social activities with family, friends, neighbors, or groups? Modererately    During the past week, to what extent has your arm, shoulder or hand problem limited your work or other regular daily activities Modererately    Arm, shoulder, or hand pain. Moderate    Tingling (pins and needles) in your arm, shoulder, or hand Moderate    Difficulty Sleeping Moderate difficulty    DASH Score 54.55 %            Objective measurements completed on examination: See above findings.       [facility] Adult PT Treatment/Exercise - 04/10/20 0001      Shoulder Exercises: Supine   Flexion AAROM;Both;5 reps   with dowel with 5 sec holds, pt return demonstrated   ABduction AAROM;Left;5 reps   with dowel, pt return demonstrated     Manual Therapy   Manual Therapy Edema management    Edema Management created foam chip pack for pt to wear in her bra, educated pt to purchase a sports bra for additional compression                  PT Education - 04/10/20 0950    Education Details anatomy and physiology of lymphatic system, need for compression when flying, use of chip pack, supine dowel exercises    Person(s) Educated Patient    Methods Explanation    Comprehension Verbalized understanding               PT Long Term Goals - 04/10/20 0957      PT LONG TERM GOAL #1   Title Pt will demonstrate 165 degrees of left shoulder flexion to allow her to reach above her head.      Time 30    Period Days    Status New    Target Date 05/10/20      PT LONG TERM GOAL #2   Title Pt will demonstrate 165 degrees of left shoulder abduction to allow her to reach out to her side.    Time 30    Period Days    Status New    Target Date 05/10/20      PT LONG TERM GOAL #3   Title Pt will be independent in self MLD for long term management of left breast and axillary swelling.    Time 30    Period Days    Status New    Target Date 05/10/20      PT LONG TERM GOAL #4   Title Pt will receive appropriate compression garments for long term management of lymphedema.    Baseline pt to be measured 9/3    Time 30    Period Days    Status New    Target Date 05/10/20  PT LONG TERM GOAL #5   Title Pt will be independent in a home exercise program for continued strengthening and stretching    Time 30    Period Days    Status New    Target Date 05/10/20                  Plan - 04/10/20 0952    Clinical Impression Statement Pt presents to PT with decreased L shoulder ROM and L breast and axillary swelling following a left lumpectomy on 03/22/19 and completion of chemo and radiation. Pt reports the swelling began in June 2021. There is fibrosis present in inferior breast and her left shoulder is limited compared to R side. She has trouble raising her arms and trouble lying on her side. She reports occasional sharp pain in her left shoulder. Pt would benefit from skilled PT services to decrease left shoulder pain, improve left shoulder ROM and assist pt with obtaining appropriate compression garments. Pt is flying on Friday and does not want to pursue a compression sleeve until she returns. She reports she has flown before without any arm swelling.    Examination-Activity Limitations Reach Overhead;Carry;Sleep;Lift    Examination-Participation Restrictions Cleaning;Laundry    Stability/Clinical Decision Making Stable/Uncomplicated    Clinical Decision Making Low     Rehab Potential Good    PT Frequency --   3 visits in 30 days   PT Duration --   30 days   PT Treatment/Interventions ADLs/Self Care Home Management;Therapeutic exercise;Therapeutic activities;Patient/family education;Manual techniques;Manual lymph drainage;Compression bandaging;Taping;Passive range of motion;Scar mobilization;Vasopneumatic Device;Joint Manipulations    PT Next Visit Plan see how chip pack worked, Did pt purchase sports bra?, begin MLD to L breast and axilla and instruct pt and issue handout, pt to be measured for compression bra on 9/3 with Melissa, begin PROM to L shoulder and issue more stretching ex as part of HEP    PT Home Exercise Plan supine dowel exercises    Recommended Other Services pt to be measured for compression bra, sleeve adn glove on 9/3 with Melissa    Consulted and Agree with Plan of Care Patient           Patient will benefit from skilled therapeutic intervention in order to improve the following deficits and impairments:  Pain, Postural dysfunction, Impaired UE functional use, Decreased strength, Decreased knowledge of use of DME, Increased edema, Decreased scar mobility, Decreased range of motion, Impaired sensation  Visit Diagnosis: Lymphedema, not elsewhere classified - Plan: PT plan of care cert/re-cert  Stiffness of left shoulder, not elsewhere classified - Plan: PT plan of care cert/re-cert  Acute pain of left shoulder - Plan: PT plan of care cert/re-cert  Disorder of the skin and subcutaneous tissue related to radiation, unspecified - Plan: PT plan of care cert/re-cert  Abnormal posture - Plan: PT plan of care cert/re-cert     Problem List Patient Active Problem List   Diagnosis Date Noted  . Chemotherapy-induced peripheral neuropathy (HCC) 02/03/2020  . Personal history of antineoplastic chemotherapy 01/26/2020  . Port-A-Cath in place 05/24/2019  . Malignant neoplasm of upper-inner quadrant of left breast in female, estrogen receptor  positive (HCC) 02/07/2019  . BV (bacterial vaginosis) 12/08/2016  . SMOKER 01/10/2010  . LUMBAGO 01/10/2010  . ELEVATED BP READING WITHOUT DX HYPERTENSION 01/10/2010    Blaire Breedlove Blue 04/10/2020, 10:05 AM  [facility] [address] , [place], [address] Phone: [phone]   Fax:  [phone]  Name:   Linda Spears MRN: [id number] Date of Birth: 12/03/1960  Blaire Breedlove Blue, PT 04/10/20 10:05 AM  

## 2020-04-10 NOTE — Patient Instructions (Signed)
Shoulder: Flexion (Supine)    With hands shoulder width apart, slowly lower dowel to floor behind head. Do not let elbows bend. Keep back flat. Hold _5-15___ seconds. Repeat _10___ times. Do __2__ sessions per day. CAUTION: Stretch slowly and gently.  Copyright  VHI. All rights reserved.  Shoulder: Abduction (Supine)    With left arm flat on floor, hold dowel in palm. Slowly move arm up to side of head by pushing with opposite arm. Do not let elbow bend. Hold _5-15___ seconds. Repeat _10___ times. Do _2___ sessions per day. CAUTION: Stretch slowly and gently.  Copyright  VHI. All rights reserved.   

## 2020-04-12 ENCOUNTER — Ambulatory Visit
Admission: RE | Admit: 2020-04-12 | Discharge: 2020-04-12 | Disposition: A | Discharge status: Home / Self Care | Payer: Medicaid Other | Source: Ambulatory Visit | Attending: Adult Health | Admitting: Adult Health

## 2020-04-12 ENCOUNTER — Other Ambulatory Visit: Payer: Self-pay | Admitting: Adult Health

## 2020-04-12 DIAGNOSIS — Z170 Estrogen receptor positive status [ER+]: Secondary | ICD-10-CM

## 2020-04-12 DIAGNOSIS — C50212 Malignant neoplasm of upper-inner quadrant of left female breast: Secondary | ICD-10-CM

## 2020-04-17 ENCOUNTER — Encounter (INDEPENDENT_AMBULATORY_CARE_PROVIDER_SITE_OTHER): Payer: Self-pay

## 2020-04-24 ENCOUNTER — Encounter (INDEPENDENT_AMBULATORY_CARE_PROVIDER_SITE_OTHER): Payer: Self-pay

## 2020-04-27 ENCOUNTER — Encounter: Payer: Self-pay | Admitting: Rehabilitation

## 2020-04-27 ENCOUNTER — Ambulatory Visit: Payer: Medicaid Other | Attending: Hematology and Oncology | Admitting: Rehabilitation

## 2020-04-27 ENCOUNTER — Other Ambulatory Visit: Payer: Self-pay

## 2020-04-27 DIAGNOSIS — L599 Disorder of the skin and subcutaneous tissue related to radiation, unspecified: Secondary | ICD-10-CM | POA: Diagnosis present

## 2020-04-27 DIAGNOSIS — R293 Abnormal posture: Secondary | ICD-10-CM | POA: Diagnosis present

## 2020-04-27 DIAGNOSIS — M25612 Stiffness of left shoulder, not elsewhere classified: Secondary | ICD-10-CM | POA: Diagnosis present

## 2020-04-27 DIAGNOSIS — I890 Lymphedema, not elsewhere classified: Secondary | ICD-10-CM | POA: Insufficient documentation

## 2020-04-27 DIAGNOSIS — M25512 Pain in left shoulder: Secondary | ICD-10-CM

## 2020-04-27 NOTE — Patient Instructions (Signed)

## 2020-04-27 NOTE — Therapy (Signed)
[place], [place], [address] Phone: [phone]   Fax:  [phone]  Physical Therapy Treatment  Patient Details  Name: Linda Spears MRN: [id number] Date of Birth: 08-09-1961 Referring Provider (PT): Lindi Adie   Encounter Date: 04/27/2020   PT End of Session - 04/27/20 0855    Visit Number 2    Number of Visits 4    Date for PT Re-Evaluation 05/10/20    Authorization Type medicaid- healthy Linda new plan.  Auth sent in 04/27/20    PT Start Time 0804    PT Stop Time 0853    PT Time Calculation (min) 49 min    Activity Tolerance Patient tolerated treatment well    Behavior During Therapy [facility] for tasks assessed/performed           Past Medical History:  Diagnosis Date  . Cancer [facility])    left breast    Past Surgical History:  Procedure Laterality Date  . BREAST LUMPECTOMY WITH RADIOACTIVE SEED AND SENTINEL LYMPH NODE BIOPSY Left 03/22/2019   Procedure: LEFT BREAST LUMPECTOMY X2 WITH RADIOACTIVE SEED AND LEFT AXILLARY SENTINEL LYMPH NODE BIOPSY;  Surgeon: Alphonsa Overall, MD;  Location: [facility];  Service: General;  Laterality: Left;  . PORTACATH PLACEMENT N/A 04/19/2019   Procedure: INSERTION PORT-A-CATH WITH ULTRASOUND;  Surgeon: Alphonsa Overall, MD;  [facility];  Service: General;  Laterality: N/A;  . TONSILLECTOMY      There were no vitals filed for this visit.   Subjective Assessment - 04/27/20 0756    Subjective I feel good.    Pertinent History L breast cancer ER+ PR+, HER2-, left lumpectomy 03/22/19 DCIS 1 of 3 nodes positive, pt has completed chemo and radiation    Patient Stated Goals to be able to reach overhead, decrease pain, get dressed easily    Currently in Pain? No/denies                             [facility] Adult PT Treatment/Exercise - 04/27/20 0001      Manual Therapy   Manual Therapy Soft tissue mobilization;Passive ROM;Manual Lymphatic Drainage (MLD)     Manual therapy comments Melissa from [facility] measuring pt for bra and sleeve/gauntlet at end of session    Soft tissue mobilization Lt axillary release and STM to the pectoralis latissimus and axillary borders in stretched position    Manual Lymphatic Drainage (MLD) with consent for breast massage; focus on self MLD instruction of the left breast with PT reading and performing each step and then hand over hand assistance and cueing.  Handout given    Passive ROM of the Lt shoulder to tolerance                  PT Education - 04/27/20 0854    Education Details self MLD    Person(s) Educated Patient    Methods Explanation;Demonstration;Tactile cues;Verbal cues;Handout    Comprehension Verbalized understanding;Returned demonstration;Verbal cues required;Tactile cues required;Need further instruction               PT Long Term Goals - 04/10/20 0957      PT LONG TERM GOAL #1   Title Pt will demonstrate 165 degrees of left shoulder flexion to allow her to reach above her head.    Time 30    Period Days    Status New    Target Date 05/10/20      PT  LONG TERM GOAL #2   Title Pt will demonstrate 165 degrees of left shoulder abduction to allow her to reach out to her side.    Time 30    Period Days    Status New    Target Date 05/10/20      PT LONG TERM GOAL #3   Title Pt will be independent in self MLD for long term management of left breast and axillary swelling.    Time 30    Period Days    Status New    Target Date 05/10/20      PT LONG TERM GOAL #4   Title Pt will receive appropriate compression garments for long term management of lymphedema.    Baseline pt to be measured 9/3    Time 30    Period Days    Status New    Target Date 05/10/20      PT LONG TERM GOAL #5   Title Pt will be independent in a home exercise program for continued strengthening and stretching    Time 30    Period Days    Status New    Target Date 05/10/20                 Plan  - 04/27/20 0856    Clinical Impression Statement First session with focus on Lt shoulder mobility and release and self MLD education for the Lt breast with softening of breast tissue post treatment and improved shoulder mobility.  Pt with good intial understanding of MLD    PT Treatment/Interventions ADLs/Self Care Home Management;Therapeutic exercise;Therapeutic activities;Patient/family education;Manual techniques;Manual lymph drainage;Compression bandaging;Taping;Passive range of motion;Scar mobilization;Vasopneumatic Device;Joint Manipulations    PT Next Visit Plan how was MLD to L breast? Review this in seated, PROM to L shoulder and issue more stretching ex as part of HEP    PT Home Exercise Plan supine dowel exercises    Recommended Other Services has been measured for bra and sleeve           Patient will benefit from skilled therapeutic intervention in order to improve the following deficits and impairments:     Visit Diagnosis: Lymphedema, not elsewhere classified  Stiffness of left shoulder, not elsewhere classified  Acute pain of left shoulder  Disorder of the skin and subcutaneous tissue related to radiation, unspecified  Abnormal posture     Problem List Patient Active Problem List   Diagnosis Date Noted  . Chemotherapy-induced peripheral neuropathy ([place]) 02/03/2020  . Personal history of antineoplastic chemotherapy 01/26/2020  . Port-A-Cath in place 05/24/2019  . Malignant neoplasm of upper-inner quadrant of left breast in female, estrogen receptor positive ([place]) 02/07/2019  . BV (bacterial vaginosis) 12/08/2016  . SMOKER 01/10/2010  . LUMBAGO 01/10/2010  . ELEVATED BP READING WITHOUT DX HYPERTENSION 01/10/2010    Linda Spears 04/27/2020, 8:58 AM  [facility] [place], [place], [address] Phone: [phone]   Fax:  [phone]  Name: Linda Spears MRN: [id number] Date of Birth:  [birth year]

## 2020-05-01 ENCOUNTER — Encounter (INDEPENDENT_AMBULATORY_CARE_PROVIDER_SITE_OTHER): Payer: Self-pay

## 2020-05-08 ENCOUNTER — Telehealth: Payer: Self-pay | Admitting: *Deleted

## 2020-05-08 ENCOUNTER — Encounter (INDEPENDENT_AMBULATORY_CARE_PROVIDER_SITE_OTHER): Payer: Self-pay

## 2020-05-08 NOTE — Telephone Encounter (Signed)
[id number]; Patient called to inform research nurse she is moving to [place] on 05/26/20.  She is willing to come in for research visit on 05/24/20 at 9 am for the Neuropathy study.  Research appointment scheduled. Asked patient if she knows what cancer center she wants to be referred to in [place] and she has not decided yet.  Instructed patient to check with her insurance to see which cancer center is covered and when she chooses one, Dr. Lindi Adie can request the referral.  Patient verbalized understanding.  Foye Spurling, BSN, RN Clinical Research Nurse 05/08/2020 11:42 AM

## 2020-05-09 ENCOUNTER — Encounter: Payer: Medicaid Other | Admitting: *Deleted

## 2020-05-09 ENCOUNTER — Other Ambulatory Visit: Payer: Medicaid Other

## 2020-05-10 ENCOUNTER — Encounter (INDEPENDENT_AMBULATORY_CARE_PROVIDER_SITE_OTHER): Payer: Self-pay

## 2020-05-11 ENCOUNTER — Ambulatory Visit: Payer: Medicaid Other | Admitting: Rehabilitation

## 2020-05-11 ENCOUNTER — Other Ambulatory Visit: Payer: Self-pay

## 2020-05-11 ENCOUNTER — Encounter: Payer: Self-pay | Admitting: Rehabilitation

## 2020-05-11 DIAGNOSIS — M25612 Stiffness of left shoulder, not elsewhere classified: Secondary | ICD-10-CM

## 2020-05-11 DIAGNOSIS — R293 Abnormal posture: Secondary | ICD-10-CM

## 2020-05-11 DIAGNOSIS — M25512 Pain in left shoulder: Secondary | ICD-10-CM

## 2020-05-11 DIAGNOSIS — L599 Disorder of the skin and subcutaneous tissue related to radiation, unspecified: Secondary | ICD-10-CM

## 2020-05-11 DIAGNOSIS — I890 Lymphedema, not elsewhere classified: Secondary | ICD-10-CM | POA: Diagnosis not present

## 2020-05-11 NOTE — Therapy (Signed)
[employer] [place], [place], [address] Phone: [phone]   Fax:  [phone]  Physical Therapy Treatment  Patient Details  Name: Linda Spears MRN: [id number] Date of Birth: [birth year] Referring Provider (PT): Lindi Adie   Encounter Date: 05/11/2020   PT End of Session - 05/11/20 0856    Visit Number 3    Number of Visits 4    Date for PT Re-Evaluation 05/10/20    Authorization Type Ok 24 visits until 05/24/20    PT Start Time 0800    PT Stop Time 7681    PT Time Calculation (min) 54 min    Activity Tolerance Patient tolerated treatment well    Behavior During Therapy [facility] for tasks assessed/performed           Past Medical History:  Diagnosis Date   Cancer ([place])    left breast    Past Surgical History:  Procedure Laterality Date   BREAST LUMPECTOMY WITH RADIOACTIVE SEED AND SENTINEL LYMPH NODE BIOPSY Left 03/22/2019   Procedure: LEFT BREAST LUMPECTOMY X2 WITH RADIOACTIVE SEED AND LEFT AXILLARY SENTINEL LYMPH NODE BIOPSY;  Surgeon: Alphonsa Overall, MD;  Location: [place];  Service: General;  Laterality: Left;   PORTACATH PLACEMENT N/A 04/19/2019   Procedure: INSERTION PORT-A-CATH WITH ULTRASOUND;  Surgeon: Alphonsa Overall, MD;  [facility];  Service: General;  Laterality: N/A;   TONSILLECTOMY      There were no vitals filed for this visit.   Subjective Assessment - 05/11/20 0801    Subjective I am moving to [facility] in a few weeks. 05/26/20 I leave.  Both of my shoulders hurt sometimes    Pertinent History L breast cancer ER+ PR+, HER2-, left lumpectomy 03/22/19 DCIS 1 of 3 nodes positive, pt has completed chemo and radiation    Patient Stated Goals to be able to reach overhead, decrease pain, get dressed easily    Currently in Pain? No/denies              [facility] PT Assessment - 05/11/20 0001      AROM   Left Shoulder Flexion 138 Degrees    Left Shoulder ABduction 125 Degrees    Left  Shoulder External Rotation 60 Degrees                         [facility] Adult PT Treatment/Exercise - 05/11/20 0001      Exercises   Exercises Shoulder      Shoulder Exercises: Pulleys   Flexion 2 minutes    Flexion Limitations with initial performance cueing     ABduction 2 minutes    ABduction Limitations with initial cueing       Shoulder Exercises: Therapy Ball   Flexion Both;10 reps      Shoulder Exercises: Stretch   Corner Stretch 2 reps;20 seconds    Corner Stretch Limitations doorway stretch      Manual Therapy   Soft tissue mobilization Lt axillary release and STM to the pectoralis latissimus and axillary borders in stretched position    Manual Lymphatic Drainage (MLD) with consent for breast massage; short neck, bil axillary nodes, Lt inguinal nodes, interaxillary pathway and Lt axillo inguinal pathway and then Left breast towards pathways and then reversing steps.  Some deeper work at inferior fibrosis    Passive ROM of the Lt shoulder to tolerance  PT Long Term Goals - 04/10/20 0957      PT LONG TERM GOAL #1   Title Pt will demonstrate 165 degrees of left shoulder flexion to allow her to reach above her head.    Time 30    Period Days    Status New    Target Date 05/10/20      PT LONG TERM GOAL #2   Title Pt will demonstrate 165 degrees of left shoulder abduction to allow her to reach out to her side.    Time 30    Period Days    Status New    Target Date 05/10/20      PT LONG TERM GOAL #3   Title Pt will be independent in self MLD for long term management of left breast and axillary swelling.    Time 30    Period Days    Status New    Target Date 05/10/20      PT LONG TERM GOAL #4   Title Pt will receive appropriate compression garments for long term management of lymphedema.    Baseline pt to be measured 9/3    Time 30    Period Days    Status New    Target Date 05/10/20      PT LONG TERM GOAL #5    Title Pt will be independent in a home exercise program for continued strengthening and stretching    Time 30    Period Days    Status New    Target Date 05/10/20                 Plan - 05/11/20 0857    Clinical Impression Statement Pt has improved AROM into abduction but not yet any changes into flexion.  Continues with Lt axillary overall tension and decreased extensibility and Left breast inferior fibroisis all improved with MT.    PT Treatment/Interventions ADLs/Self Care Home Management;Therapeutic exercise;Therapeutic activities;Patient/family education;Manual techniques;Manual lymph drainage;Compression bandaging;Taping;Passive range of motion;Scar mobilization;Vasopneumatic Device;Joint Manipulations    PT Next Visit Plan final HEP for moving      Cont MLD to L breast?, PROM to L shoulder, need to change sun[place] address?    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

## 2020-05-15 ENCOUNTER — Encounter (INDEPENDENT_AMBULATORY_CARE_PROVIDER_SITE_OTHER): Payer: Self-pay

## 2020-05-18 ENCOUNTER — Ambulatory Visit: Payer: Medicaid Other | Admitting: Rehabilitation

## 2020-05-18 ENCOUNTER — Encounter: Payer: Self-pay | Admitting: Rehabilitation

## 2020-05-18 ENCOUNTER — Other Ambulatory Visit: Payer: Self-pay

## 2020-05-18 DIAGNOSIS — L599 Disorder of the skin and subcutaneous tissue related to radiation, unspecified: Secondary | ICD-10-CM

## 2020-05-18 DIAGNOSIS — I890 Lymphedema, not elsewhere classified: Secondary | ICD-10-CM | POA: Diagnosis not present

## 2020-05-18 DIAGNOSIS — M25512 Pain in left shoulder: Secondary | ICD-10-CM

## 2020-05-18 DIAGNOSIS — M25612 Stiffness of left shoulder, not elsewhere classified: Secondary | ICD-10-CM

## 2020-05-18 DIAGNOSIS — R293 Abnormal posture: Secondary | ICD-10-CM

## 2020-05-18 NOTE — Therapy (Signed)
[place], [place], [address] Phone: [phone]   Fax:  [phone]  Physical Therapy Treatment  Patient Details  Name: Linda Spears MRN: [id number] Date of Birth: 06-14-1961 Referring Provider (PT): Lindi Adie   Encounter Date: 05/18/2020   PT End of Session - 05/18/20 1239    Visit Number 4    Number of Visits 4    Date for PT Re-Evaluation 05/10/20    PT Start Time 0900    PT Stop Time 0954    PT Time Calculation (min) 54 min    Activity Tolerance Patient tolerated treatment well    Behavior During Therapy [facility] for tasks assessed/performed           Past Medical History:  Diagnosis Date  . Cancer [facility])    left breast    Past Surgical History:  Procedure Laterality Date  . BREAST LUMPECTOMY WITH RADIOACTIVE SEED AND SENTINEL LYMPH NODE BIOPSY Left 03/22/2019   Procedure: LEFT BREAST LUMPECTOMY X2 WITH RADIOACTIVE SEED AND LEFT AXILLARY SENTINEL LYMPH NODE BIOPSY;  Surgeon: Alphonsa Overall, MD;  Location: [place];  Service: General;  Laterality: Left;  . PORTACATH PLACEMENT N/A 04/19/2019   Procedure: INSERTION PORT-A-CATH WITH ULTRASOUND;  Surgeon: Alphonsa Overall, MD;  [facility];  Service: General;  Laterality: N/A;  . TONSILLECTOMY      There were no vitals filed for this visit.   Subjective Assessment - 05/18/20 0904    Subjective I need some more therapy in [place] most likely.  Pain in the armpit and shoulder more with activity    Pertinent History L breast cancer ER+ PR+, HER2-, left lumpectomy 03/22/19 DCIS 1 of 3 nodes positive, pt has completed chemo and radiation    Patient Stated Goals to be able to reach overhead, decrease pain, get dressed easily    Currently in Pain? No/denies              [facility] PT Assessment - 05/18/20 0001      AROM   Left Shoulder Flexion 136 Degrees    Left Shoulder ABduction 126 Degrees                         [facility] Adult PT  Treatment/Exercise - 05/18/20 0001      Shoulder Exercises: Standing   External Rotation Both;10 reps    Theraband Level (Shoulder External Rotation) Level 1 (Yellow)    Row Both;10 reps    Theraband Level (Shoulder Row) Level 1 (Yellow)      Shoulder Exercises: Pulleys   Flexion 2 minutes    ABduction 2 minutes      Shoulder Exercises: Therapy Ball   Flexion Both;10 reps      Manual Therapy   Manual Lymphatic Drainage (MLD) with consent for breast massage; short neck, bil axillary nodes, Lt inguinal nodes, interaxillary pathway and Lt axillo inguinal pathway and then Left breast towards pathways and then reversing steps.  Some deeper work at inferior fibrosis    Passive ROM of the Lt shoulder to tolerance                       PT Long Term Goals - 05/18/20 0909      PT LONG TERM GOAL #1   Title Pt will demonstrate 165 degrees of left shoulder flexion to allow her to reach above her head.    Baseline to 135    Status  Partially Met      PT LONG TERM GOAL #2   Title Pt will demonstrate 165 degrees of left shoulder abduction to allow her to reach out to her side.    Baseline to 126    Status Partially Met      PT LONG TERM GOAL #3   Title Pt will be independent in self MLD for long term management of left breast and axillary swelling.    Status Achieved      PT LONG TERM GOAL #4   Title Pt will receive appropriate compression garments for long term management of lymphedema.    Baseline will be sent to new address    Status Partially Met      PT LONG TERM GOAL #5   Title Pt will be independent in a home exercise program for continued strengthening and stretching    Status Achieved                 Plan - 05/18/20 1239    Clinical Impression Statement Pt arrived for her last visit as she is moving to [place] this weekend.  Pt continues with inferior breast fibrosis and dense scar tissue and decreased shoulder AROM and will benefit from continued  therapy in [place].    Consulted and Agree with Plan of Care Patient           Patient will benefit from skilled therapeutic intervention in order to improve the following deficits and impairments:     Visit Diagnosis: Lymphedema, not elsewhere classified  Stiffness of left shoulder, not elsewhere classified  Acute pain of left shoulder  Disorder of the skin and subcutaneous tissue related to radiation, unspecified  Abnormal posture     Problem List Patient Active Problem List   Diagnosis Date Noted  . Chemotherapy-induced peripheral neuropathy ([place]) 02/03/2020  . Personal history of antineoplastic chemotherapy 01/26/2020  . Port-A-Cath in place 05/24/2019  . Malignant neoplasm of upper-inner quadrant of left breast in female, estrogen receptor positive ([place]) 02/07/2019  . BV (bacterial vaginosis) 12/08/2016  . SMOKER 01/10/2010  . LUMBAGO 01/10/2010  . ELEVATED BP READING WITHOUT DX HYPERTENSION 01/10/2010    Stark Bray 05/18/2020, 12:43 PM  [place], [place], [address] Phone: [phone]   Fax:  [phone]  Name: Linda Spears MRN: [id number] Date of Birth: June 24, 1961  PHYSICAL THERAPY DISCHARGE SUMMARY  Visits from Start of Care: 4  Current functional level related to goals / functional outcomes: see above   Remaining deficits: Need for continued PT in [employer] / Equipment: Final HEP Plan: Patient agrees to discharge.  Patient goals were partially met. Patient is being discharged due to the patient's request.  ?????     Shan Levans, PT

## 2020-05-18 NOTE — Patient Instructions (Signed)
Access Code: [email or web address]: [email or web address]: 09/24/2021Prepared by: Marcene Brawn TevisExercises  Standing shoulder flexion wall slides - 1 x daily - 7 x weekly - 1 sets - 5 reps - 5-6 second hold  Standing Shoulder Abduction Slides at Wall - 1 x daily - 7 x weekly - 1 sets - 5 reps - 5-6 second hold  Standing Row with Anchored Resistance - 1 x daily - 3-4 x weekly - 1-3 sets - 10 reps - 2-3 second hold  Doorway Pec Stretch at 90 Degrees Abduction - 1 x daily - 7 x weekly - 1-3 sets - 10 reps - 20-30 seconds hold  Standing Shoulder External Rotation with Resistance - 1 x daily - 7 x weekly - 1-3 sets - 10 reps - 20-30 seconds hold

## 2020-05-22 ENCOUNTER — Encounter (INDEPENDENT_AMBULATORY_CARE_PROVIDER_SITE_OTHER): Payer: Self-pay

## 2020-05-23 ENCOUNTER — Telehealth: Payer: Self-pay | Admitting: *Deleted

## 2020-05-23 NOTE — Telephone Encounter (Signed)
Patient called to request referral to Oncologist in [place].  She is moving next week and provided nurse with her new address, [address], [place], [place] [address].  Patient requested to be referred to a cancer center closest to her new address and states her insurance covers all the cancer centers in [place].  Patient does not have a preference as long as it is close since transportation is a factor.  [facility] is aproximately 5 miles from patient's new address and they also have the Upbeat study open at their site. Patient requests referral to this cancer center and request has been sent to Dr. Lindi Adie.  Patient also confirmed her appointment tomorrow for [phone] study visit at 74 am.  Foye Spurling, BSN, RN Clinical Research Nurse 05/23/2020 10:46 AM

## 2020-05-24 ENCOUNTER — Encounter: Payer: Self-pay | Admitting: *Deleted

## 2020-05-24 ENCOUNTER — Inpatient Hospital Stay: Payer: Medicaid Other | Attending: Hematology and Oncology | Admitting: *Deleted

## 2020-05-24 ENCOUNTER — Other Ambulatory Visit: Payer: Self-pay

## 2020-05-24 DIAGNOSIS — Z170 Estrogen receptor positive status [ER+]: Secondary | ICD-10-CM

## 2020-05-24 DIAGNOSIS — C50212 Malignant neoplasm of upper-inner quadrant of left female breast: Secondary | ICD-10-CM

## 2020-05-24 NOTE — Progress Notes (Signed)
Upbeat Consent Addendum; Patient in clinic today to complete study activities on the S1714 study.  Patient agreed to discuss the consent addendum for the Upbeat Study while she was here. Research nurse read the consent addendum form script for the [id number] PVD 03/29/20 to patient.  Patient denied any questions and she agreed to receive future questionnaires for the study by email and provided her email address, [email or web address].  Patient also agreed to answer questions for the new 24 month questionnaires and understands she can skip any questions that make her feel uncomfortable. Thanked patient for her time and participation.  Patient is moving to [place] next week and her care will be transferred to [facility].  Informed patient it looks like the Upbeat study is open at that site and research nurse will see if we can transfer her in the study to [place].  Patient ageed to stay on study and complete the 24 months activities in one year if it can be done in [place].  Patient states she does not want to travel back to [facility] for the study.  Thanked patient and informed her research nurse will call her before the next visit is due to let her know if it can be done at her her cancer center. Patient verbalized understanding.  Foye Spurling, BSN, RN Clinical Research Nurse 05/24/2020

## 2020-05-24 NOTE — Research (Signed)
J0093, A PROSPECTIVE OBSERVATIONAL COHORT STUDY TO DEVELOP A PREDICTIVE MODEL OF TAXANE-INDUCED PERIPHERAL NEUROPATHY IN CANCER PATIENTS. 52 WEEKS VISIT: PROs; Questionnaires were given to patient to complete in clinic.Collected questionnaires and checked for completeness and accuracy.  History of Falls;  Patient denies having any falls in the past 6 months. Labs: Patient did not provide consent for optional labs. Solicited Neuropathy Events; Patient reports continued feeling of numbness and tingling in fingertips and toes. It does not interfere with any functioning or ADLs and it is not any worse since last visit. Grade 1 Paresthesia reported on CRF and Dr. Lindi Adie agrees and states this is related to Taxol.  Treatment; Patient has completed chemotherapy treatment. Medications and Interventions for CIPN; Reviewed with patient and CRFs completed. Patient reports she continues to use tylenol prn for shoulder pain, not for neuropathy symptoms. She reports continuting use of tea tree oil twice a day to her hands and fingertips to help relieve neuropathy symptoms.  Neuropen Assessment; Completed per protocol by this research RN with assistance timing and recording by Wilber Bihari, research RN.  [place] Assessment; Completed per protocol by this research RN with assistance timing and recording by Wilber Bihari, research RN.  Timed Get Up and Go Test; Completed.   Physician Assessments; Treatment [place] form completed and signed by Dr. Lindi Adie today. He spoke with patient at the beginning of our visit.  Plan; Informed patient of next study assessments due at the 2 and 3 year time point and can be completed over the phone. Patient is moving to [place] next week and she agreed to continue on the study since it can be completed by phone. Informed patient that request for referral to [facility] has been requested and Wilber Bihari, NP, is working on it.  Gave  patient information with phone number for this [facility] and informed it is aprox 5 miles from her new address in [place]. Patient was appreciative of the referral.  She has one more refill on her Anastrozole so she should be okay until seen by Oncologist in [place]. Thanked patient for her participation and support of study.  She was encouraged to call Dr. Lindi Adie or research nurse with any questions or concerns she may have before she is seen at [facility].  Foye Spurling, BSN, RN Clinical Research Nurse 05/24/2020 11:22 AM

## 2020-05-30 ENCOUNTER — Encounter (INDEPENDENT_AMBULATORY_CARE_PROVIDER_SITE_OTHER): Payer: Self-pay

## 2020-05-30 ENCOUNTER — Other Ambulatory Visit: Payer: Self-pay | Admitting: *Deleted

## 2020-05-30 ENCOUNTER — Telehealth: Payer: Self-pay | Admitting: *Deleted

## 2020-05-30 DIAGNOSIS — Z170 Estrogen receptor positive status [ER+]: Secondary | ICD-10-CM

## 2020-05-30 DIAGNOSIS — C50212 Malignant neoplasm of upper-inner quadrant of left female breast: Secondary | ICD-10-CM

## 2020-05-30 NOTE — Telephone Encounter (Signed)
Referral along with documentation was faxed to [facility] in [place]. [phone]. Confirmation received.

## 2020-06-01 ENCOUNTER — Telehealth: Payer: Self-pay | Admitting: *Deleted

## 2020-06-01 NOTE — Telephone Encounter (Signed)
LVM for patient to let her know that Dr. Lindi Adie did make the referral to [facility] in [place]. She should expect a call from [facility] to schedule appointment to get established with new Oncologist. Asked her to call back if any questions.  Foye Spurling, BSN, RN Clinical Research Nurse 06/01/2020 2:36 PM

## 2020-06-05 ENCOUNTER — Encounter (INDEPENDENT_AMBULATORY_CARE_PROVIDER_SITE_OTHER): Payer: Self-pay

## 2020-06-12 ENCOUNTER — Encounter (INDEPENDENT_AMBULATORY_CARE_PROVIDER_SITE_OTHER): Payer: Self-pay

## 2020-06-21 ENCOUNTER — Encounter: Payer: Self-pay | Admitting: *Deleted

## 2020-06-21 NOTE — Progress Notes (Signed)
Upbeat Study: Returned call to [place] at [place] [phone]) regarding transferring patient on the Upbeat study to [facility].  LVM informing next study visit for 24 months is due 04/15/21.  Requested call back at his convenience to discuss how to arrange transfer.  Foye Spurling, BSN, RN Clinical Research Nurse 06/21/2020 3:50 PM

## 2020-06-29 ENCOUNTER — Other Ambulatory Visit: Payer: Medicaid Other

## 2020-07-23 ENCOUNTER — Ambulatory Visit (INDEPENDENT_AMBULATORY_CARE_PROVIDER_SITE_OTHER): Payer: Medicaid Other | Admitting: Primary Care

## 2021-01-28 ENCOUNTER — Telehealth: Payer: Self-pay | Admitting: *Deleted

## 2021-01-28 NOTE — Telephone Encounter (Signed)
Called the phone number patient provided as having an appointment related to the Upbeat research study and I was informed that patient's appointment is to see PCP and is not research related.  Will follow up with research staff at [facility] regarding transfer of patient to their research department for the Upbeat Study.  Foye Spurling, BSN, RN Clinical Research Nurse 01/28/2021 2:24 PM

## 2021-01-28 NOTE — Telephone Encounter (Signed)
Upbeat Study; Called patient to remind her of 24 months visit on Upbeat Study is due 04/25/21 +/- 60 days. Asked patient if she has any plans to come to [place] during the 4 month window. If so, we can schedule her to complete the visit at our clinic during that timeframe. Patient states she is unsure of any travel plans at this time and that she already has an appointment this week for the "Upbeat Class" for the research study at [facility]. She provided the phone number [phone].  Informed patient that I have not talked to anyone at [facility] yet about the study, but I will call the number she gave me to make sure they have her research information.  Patient verbalized understanding.  Foye Spurling, BSN, RN Clinical Research Nurse 01/28/2021 12:53 PM

## 2021-01-31 ENCOUNTER — Encounter: Payer: Self-pay | Admitting: *Deleted

## 2021-01-31 NOTE — Progress Notes (Signed)
Upbeat Study; Received call from research coordinator, Lenward Chancellor, at [facility] in [place].  She states she will be the coordinator for patient to transfer on the [place]. She is aware of next study visit for 24 months is due on 04/25/21 +/- 60 days.  Tiffany states she will initiate the transfer or call this research nurse back if it needs to be completed on our end. Asked her to call back if there are any records or information needed.  Patient has not had any clinic visits at [facility] since completing her 12 month visit here last year. Tiffany said she will call back if they need any further information to transfer patient.  Her number is [phone] if we need to contact them for any reason.  Foye Spurling, BSN, RN Clinical Research Nurse 01/31/2021

## 2021-06-27 ENCOUNTER — Telehealth: Payer: Self-pay | Admitting: Emergency Medicine

## 2021-06-27 NOTE — Telephone Encounter (Signed)
S1714 - A Prospective Observational Cohort Study to Develop a Predictive Model of Taxane-Induced Peripheral Neuropathy in Cancer Patients  06/27/21 - Week 104  Week 104 call: Called patient to conduct week 104 assessments.  The patient states she continues to have numbness and tingling in her fingers and toes.  She states this does make it hard to open packages and manipulate small objects, but otherwise does not impact her activities of daily living.  She is currently taking anastrozole but is not on any other treatment for her breast cancer.    PROs: Completed EORTC QLQ-CIPN20, PRO-CTCAE, and FACT/GOG-NTX-4 verbally over the phone.  Plan: The patient was informed to expect a phone call in approximately one year to complete the week 156 assessments.  The patient verbalized understanding and denied questions at this time.  Clabe Seal Clinical Research Coordinator I  06/27/21  11:43 AM

## 2021-10-04 ENCOUNTER — Telehealth: Payer: Self-pay | Admitting: Cardiology

## 2021-10-04 NOTE — Telephone Encounter (Signed)
Called patient to schedule recall. She states she has moved to [place] and will not be following up with the office.

## 2022-02-21 ENCOUNTER — Other Ambulatory Visit: Payer: Self-pay | Admitting: Nurse Practitioner

## 2022-09-11 ENCOUNTER — Telehealth: Payer: Self-pay

## 2022-09-11 DIAGNOSIS — C50212 Malignant neoplasm of upper-inner quadrant of left female breast: Secondary | ICD-10-CM

## 2022-09-11 DIAGNOSIS — Z170 Estrogen receptor positive status [ER+]: Secondary | ICD-10-CM

## 2022-09-11 NOTE — Telephone Encounter (Signed)
S1714 - A Prospective Observational Cohort Study to Develop a Predictive Model of Taxane-Induced Peripheral Neuropathy in Cancer Patients   Week 156 call: Called patient to conduct week 156 assessments.  The patient states she continues to have numbness and tingling in her fingers and toes.  She states this does not impact her activities or daily living.     PROs: Completed EORTC QLQ-CIPN20, PRO-CTCAE, and FACT/GOG-NTX-4 verbally over the phone.  I have informed the patient that this is her last follow up call for the S1714 study and thanked her for her time.   Johny Drilling, [facility] 09/11/2022 4:37 PM

## 2023-01-07 ENCOUNTER — Other Ambulatory Visit: Payer: Self-pay | Admitting: Hematology and Oncology

## 2023-06-08 NOTE — Telephone Encounter (Signed)
error 

## 2023-07-16 NOTE — Telephone Encounter (Signed)
Telephone call
